# Patient Record
Sex: Female | Born: 1935 | Race: White | Hispanic: No | Smoking: Never smoker
Health system: Southern US, Community
[De-identification: ages and names within clinical notes are randomized; demographics above are authoritative.]

## PROBLEM LIST (undated history)

## (undated) DIAGNOSIS — R945 Abnormal results of liver function studies: Secondary | ICD-10-CM

## (undated) DIAGNOSIS — S129XXA Fracture of neck, unspecified, initial encounter: Secondary | ICD-10-CM

## (undated) DIAGNOSIS — I447 Left bundle-branch block, unspecified: Secondary | ICD-10-CM

## (undated) DIAGNOSIS — D509 Iron deficiency anemia, unspecified: Secondary | ICD-10-CM

## (undated) DIAGNOSIS — K8689 Other specified diseases of pancreas: Secondary | ICD-10-CM

## (undated) DIAGNOSIS — K648 Other hemorrhoids: Secondary | ICD-10-CM

## (undated) DIAGNOSIS — M1990 Unspecified osteoarthritis, unspecified site: Secondary | ICD-10-CM

## (undated) DIAGNOSIS — E538 Deficiency of other specified B group vitamins: Secondary | ICD-10-CM

## (undated) DIAGNOSIS — I428 Other cardiomyopathies: Secondary | ICD-10-CM

## (undated) DIAGNOSIS — K589 Irritable bowel syndrome without diarrhea: Secondary | ICD-10-CM

## (undated) DIAGNOSIS — K2960 Other gastritis without bleeding: Secondary | ICD-10-CM

## (undated) DIAGNOSIS — M797 Fibromyalgia: Secondary | ICD-10-CM

## (undated) DIAGNOSIS — Z7901 Long term (current) use of anticoagulants: Secondary | ICD-10-CM

## (undated) DIAGNOSIS — Z9289 Personal history of other medical treatment: Secondary | ICD-10-CM

## (undated) DIAGNOSIS — K5790 Diverticulosis of intestine, part unspecified, without perforation or abscess without bleeding: Secondary | ICD-10-CM

## (undated) DIAGNOSIS — I2699 Other pulmonary embolism without acute cor pulmonale: Secondary | ICD-10-CM

## (undated) DIAGNOSIS — R7989 Other specified abnormal findings of blood chemistry: Secondary | ICD-10-CM

## (undated) DIAGNOSIS — I495 Sick sinus syndrome: Secondary | ICD-10-CM

## (undated) DIAGNOSIS — I509 Heart failure, unspecified: Secondary | ICD-10-CM

## (undated) DIAGNOSIS — R931 Abnormal findings on diagnostic imaging of heart and coronary circulation: Secondary | ICD-10-CM

## (undated) DIAGNOSIS — D649 Anemia, unspecified: Secondary | ICD-10-CM

## (undated) DIAGNOSIS — K922 Gastrointestinal hemorrhage, unspecified: Secondary | ICD-10-CM

## (undated) DIAGNOSIS — I482 Chronic atrial fibrillation, unspecified: Secondary | ICD-10-CM

## (undated) HISTORY — DX: Chronic atrial fibrillation, unspecified: I48.20

## (undated) HISTORY — PX: BACK SURGERY: SHX140

## (undated) HISTORY — DX: Left bundle-branch block, unspecified: I44.7

## (undated) HISTORY — PX: BLADDER SURGERY: SHX569

## (undated) HISTORY — DX: Gastrointestinal hemorrhage, unspecified: K92.2

## (undated) HISTORY — PX: CARPAL TUNNEL RELEASE: SHX101

## (undated) HISTORY — PX: OVARIAN CYST REMOVAL: SHX89

## (undated) HISTORY — DX: Long term (current) use of anticoagulants: Z79.01

## (undated) HISTORY — DX: Personal history of other medical treatment: Z92.89

## (undated) HISTORY — DX: Abnormal results of liver function studies: R94.5

## (undated) HISTORY — DX: Fracture of neck, unspecified, initial encounter: S12.9XXA

## (undated) HISTORY — DX: Other specified abnormal findings of blood chemistry: R79.89

## (undated) HISTORY — DX: Abnormal findings on diagnostic imaging of heart and coronary circulation: R93.1

## (undated) HISTORY — DX: Other specified diseases of pancreas: K86.89

## (undated) HISTORY — DX: Other cardiomyopathies: I42.8

## (undated) HISTORY — DX: Iron deficiency anemia, unspecified: D50.9

## (undated) HISTORY — DX: Deficiency of other specified B group vitamins: E53.8

## (undated) HISTORY — DX: Diverticulosis of intestine, part unspecified, without perforation or abscess without bleeding: K57.90

## (undated) HISTORY — PX: WRIST SURGERY: SHX841

## (undated) HISTORY — PX: OTHER SURGICAL HISTORY: SHX169

## (undated) HISTORY — DX: Unspecified osteoarthritis, unspecified site: M19.90

## (undated) HISTORY — DX: Sick sinus syndrome: I49.5

## (undated) HISTORY — PX: HERNIA REPAIR: SHX51

## (undated) HISTORY — DX: Other pulmonary embolism without acute cor pulmonale: I26.99

## (undated) HISTORY — DX: Other gastritis without bleeding: K29.60

## (undated) HISTORY — DX: Other hemorrhoids: K64.8

## (undated) HISTORY — DX: Irritable bowel syndrome, unspecified: K58.9

## (undated) HISTORY — PX: TUBAL LIGATION: SHX77

## (undated) HISTORY — DX: Anemia, unspecified: D64.9

---

## 1992-12-18 HISTORY — PX: CARDIAC CATHETERIZATION: SHX172

## 2000-09-11 ENCOUNTER — Encounter: Admission: RE | Admit: 2000-09-11 | Discharge: 2000-10-09 | Payer: Self-pay | Admitting: Orthopedic Surgery

## 2000-10-15 ENCOUNTER — Encounter: Admission: RE | Admit: 2000-10-15 | Discharge: 2000-12-10 | Payer: Self-pay | Admitting: Orthopedic Surgery

## 2001-03-21 ENCOUNTER — Encounter: Payer: Self-pay | Admitting: Emergency Medicine

## 2001-03-21 ENCOUNTER — Inpatient Hospital Stay (HOSPITAL_COMMUNITY): Admission: EM | Admit: 2001-03-21 | Discharge: 2001-03-26 | Payer: Self-pay | Admitting: Emergency Medicine

## 2001-03-22 ENCOUNTER — Encounter: Payer: Self-pay | Admitting: Cardiovascular Disease

## 2001-03-25 ENCOUNTER — Encounter: Payer: Self-pay | Admitting: Cardiovascular Disease

## 2001-10-23 ENCOUNTER — Encounter: Payer: Self-pay | Admitting: Emergency Medicine

## 2001-10-24 ENCOUNTER — Inpatient Hospital Stay (HOSPITAL_COMMUNITY): Admission: AC | Admit: 2001-10-24 | Discharge: 2001-11-04 | Payer: Self-pay | Admitting: Emergency Medicine

## 2001-10-24 ENCOUNTER — Encounter: Payer: Self-pay | Admitting: Surgery

## 2001-10-24 ENCOUNTER — Encounter: Payer: Self-pay | Admitting: Emergency Medicine

## 2001-10-24 ENCOUNTER — Encounter: Payer: Self-pay | Admitting: Orthopedic Surgery

## 2001-10-25 ENCOUNTER — Encounter: Payer: Self-pay | Admitting: Emergency Medicine

## 2001-10-27 ENCOUNTER — Encounter: Payer: Self-pay | Admitting: Surgery

## 2001-10-28 ENCOUNTER — Encounter: Payer: Self-pay | Admitting: Surgery

## 2001-10-31 ENCOUNTER — Encounter: Payer: Self-pay | Admitting: General Surgery

## 2001-11-01 ENCOUNTER — Encounter: Payer: Self-pay | Admitting: General Surgery

## 2001-11-03 ENCOUNTER — Encounter: Payer: Self-pay | Admitting: General Surgery

## 2001-12-19 ENCOUNTER — Encounter: Payer: Self-pay | Admitting: Neurosurgery

## 2001-12-19 ENCOUNTER — Ambulatory Visit (HOSPITAL_COMMUNITY): Admission: RE | Admit: 2001-12-19 | Discharge: 2001-12-19 | Payer: Self-pay | Admitting: Neurosurgery

## 2002-01-20 ENCOUNTER — Ambulatory Visit (HOSPITAL_COMMUNITY): Admission: RE | Admit: 2002-01-20 | Discharge: 2002-01-20 | Payer: Self-pay | Admitting: Neurosurgery

## 2002-01-20 ENCOUNTER — Encounter: Payer: Self-pay | Admitting: Neurosurgery

## 2002-01-25 ENCOUNTER — Emergency Department (HOSPITAL_COMMUNITY): Admission: EM | Admit: 2002-01-25 | Discharge: 2002-01-25 | Payer: Self-pay | Admitting: Emergency Medicine

## 2002-09-19 ENCOUNTER — Inpatient Hospital Stay (HOSPITAL_COMMUNITY): Admission: EM | Admit: 2002-09-19 | Discharge: 2002-09-26 | Payer: Self-pay | Admitting: Emergency Medicine

## 2002-09-19 ENCOUNTER — Encounter: Payer: Self-pay | Admitting: Emergency Medicine

## 2002-09-20 ENCOUNTER — Encounter: Payer: Self-pay | Admitting: Specialist

## 2002-09-22 ENCOUNTER — Encounter: Payer: Self-pay | Admitting: Specialist

## 2002-09-23 ENCOUNTER — Encounter: Payer: Self-pay | Admitting: Specialist

## 2002-09-24 ENCOUNTER — Encounter: Payer: Self-pay | Admitting: Specialist

## 2002-09-25 ENCOUNTER — Encounter: Payer: Self-pay | Admitting: Specialist

## 2002-09-26 ENCOUNTER — Inpatient Hospital Stay
Admission: RE | Admit: 2002-09-26 | Discharge: 2002-10-08 | Payer: Self-pay | Admitting: Physical Medicine & Rehabilitation

## 2003-04-17 ENCOUNTER — Ambulatory Visit (HOSPITAL_COMMUNITY): Admission: RE | Admit: 2003-04-17 | Discharge: 2003-04-17 | Payer: Self-pay | Admitting: Family Medicine

## 2003-04-17 ENCOUNTER — Encounter: Payer: Self-pay | Admitting: Family Medicine

## 2004-04-22 ENCOUNTER — Ambulatory Visit (HOSPITAL_COMMUNITY): Admission: RE | Admit: 2004-04-22 | Discharge: 2004-04-22 | Payer: Self-pay | Admitting: Family Medicine

## 2004-06-10 ENCOUNTER — Ambulatory Visit (HOSPITAL_COMMUNITY): Admission: RE | Admit: 2004-06-10 | Discharge: 2004-06-10 | Payer: Self-pay | Admitting: General Surgery

## 2005-06-23 ENCOUNTER — Ambulatory Visit (HOSPITAL_COMMUNITY): Admission: RE | Admit: 2005-06-23 | Discharge: 2005-06-23 | Payer: Self-pay | Admitting: Family Medicine

## 2006-05-30 ENCOUNTER — Encounter: Admission: RE | Admit: 2006-05-30 | Discharge: 2006-05-30 | Payer: Self-pay | Admitting: Neurosurgery

## 2007-04-18 ENCOUNTER — Ambulatory Visit (HOSPITAL_COMMUNITY): Admission: RE | Admit: 2007-04-18 | Discharge: 2007-04-18 | Payer: Self-pay | Admitting: Family Medicine

## 2007-04-26 ENCOUNTER — Ambulatory Visit (HOSPITAL_COMMUNITY): Admission: RE | Admit: 2007-04-26 | Discharge: 2007-04-26 | Payer: Self-pay | Admitting: Family Medicine

## 2007-06-04 ENCOUNTER — Ambulatory Visit: Payer: Self-pay | Admitting: Internal Medicine

## 2007-06-06 ENCOUNTER — Ambulatory Visit: Payer: Self-pay | Admitting: Internal Medicine

## 2007-07-18 ENCOUNTER — Ambulatory Visit: Payer: Self-pay | Admitting: Internal Medicine

## 2007-07-24 ENCOUNTER — Ambulatory Visit (HOSPITAL_COMMUNITY): Admission: RE | Admit: 2007-07-24 | Discharge: 2007-07-24 | Payer: Self-pay | Admitting: Internal Medicine

## 2007-08-15 ENCOUNTER — Encounter: Admission: RE | Admit: 2007-08-15 | Discharge: 2007-08-15 | Payer: Self-pay | Admitting: Cardiovascular Disease

## 2007-08-21 ENCOUNTER — Ambulatory Visit (HOSPITAL_COMMUNITY): Admission: RE | Admit: 2007-08-21 | Discharge: 2007-08-21 | Payer: Self-pay | Admitting: Cardiovascular Disease

## 2007-08-28 ENCOUNTER — Ambulatory Visit: Payer: Self-pay | Admitting: Internal Medicine

## 2007-09-18 ENCOUNTER — Ambulatory Visit: Payer: Self-pay | Admitting: Internal Medicine

## 2007-09-18 LAB — CONVERTED CEMR LAB
OCCULT 1: NEGATIVE
OCCULT 4: NEGATIVE

## 2007-10-07 ENCOUNTER — Ambulatory Visit: Payer: Self-pay | Admitting: Internal Medicine

## 2008-02-24 DIAGNOSIS — D68318 Other hemorrhagic disorder due to intrinsic circulating anticoagulants, antibodies, or inhibitors: Secondary | ICD-10-CM | POA: Insufficient documentation

## 2008-02-24 DIAGNOSIS — I4891 Unspecified atrial fibrillation: Secondary | ICD-10-CM | POA: Insufficient documentation

## 2008-02-24 DIAGNOSIS — M1990 Unspecified osteoarthritis, unspecified site: Secondary | ICD-10-CM

## 2008-02-24 DIAGNOSIS — R945 Abnormal results of liver function studies: Secondary | ICD-10-CM | POA: Insufficient documentation

## 2008-02-24 DIAGNOSIS — K589 Irritable bowel syndrome without diarrhea: Secondary | ICD-10-CM | POA: Insufficient documentation

## 2009-04-19 ENCOUNTER — Ambulatory Visit (HOSPITAL_COMMUNITY): Admission: RE | Admit: 2009-04-19 | Discharge: 2009-04-19 | Payer: Self-pay | Admitting: Family Medicine

## 2009-05-12 ENCOUNTER — Encounter: Admission: RE | Admit: 2009-05-12 | Discharge: 2009-05-12 | Payer: Self-pay | Admitting: Family Medicine

## 2009-06-17 DIAGNOSIS — K2960 Other gastritis without bleeding: Secondary | ICD-10-CM

## 2009-06-17 HISTORY — DX: Other gastritis without bleeding: K29.60

## 2009-06-17 HISTORY — PX: ESOPHAGOGASTRODUODENOSCOPY: SHX1529

## 2009-07-02 ENCOUNTER — Telehealth (INDEPENDENT_AMBULATORY_CARE_PROVIDER_SITE_OTHER): Payer: Self-pay

## 2009-07-02 ENCOUNTER — Ambulatory Visit (HOSPITAL_COMMUNITY): Admission: RE | Admit: 2009-07-02 | Discharge: 2009-07-02 | Payer: Self-pay | Admitting: Family Medicine

## 2009-07-05 ENCOUNTER — Ambulatory Visit: Payer: Self-pay | Admitting: Internal Medicine

## 2009-07-05 LAB — CONVERTED CEMR LAB
BUN: 20 mg/dL (ref 6–23)
Basophils Relative: 0.4 % (ref 0.0–3.0)
Creatinine, Ser: 0.9 mg/dL (ref 0.4–1.2)
Eosinophils Absolute: 0.1 10*3/uL (ref 0.0–0.7)
GFR calc non Af Amer: 63.22 mL/min (ref >60)
MCHC: 34 g/dL (ref 30.0–36.0)
MCV: 94.8 fL (ref 78.0–100.0)
Monocytes Absolute: 0.4 10*3/uL (ref 0.1–1.0)
Neutro Abs: 2.2 10*3/uL (ref 1.4–7.7)
Neutrophils Relative %: 54.5 % (ref 43.0–77.0)
Prothrombin Time: 11.2 s (ref 10.9–13.3)
RBC: 4.19 M/uL (ref 3.87–5.11)

## 2009-07-06 ENCOUNTER — Encounter: Payer: Self-pay | Admitting: Internal Medicine

## 2009-07-06 ENCOUNTER — Ambulatory Visit: Payer: Self-pay | Admitting: Internal Medicine

## 2009-07-08 ENCOUNTER — Telehealth: Payer: Self-pay | Admitting: Internal Medicine

## 2009-08-18 ENCOUNTER — Ambulatory Visit: Payer: Self-pay | Admitting: Internal Medicine

## 2009-08-18 HISTORY — PX: COLONOSCOPY: SHX174

## 2009-08-19 ENCOUNTER — Telehealth: Payer: Self-pay | Admitting: Internal Medicine

## 2009-08-19 ENCOUNTER — Ambulatory Visit: Payer: Self-pay | Admitting: Internal Medicine

## 2009-08-19 LAB — CONVERTED CEMR LAB
Basophils Absolute: 0 10*3/uL (ref 0.0–0.1)
Eosinophils Absolute: 0 10*3/uL (ref 0.0–0.7)
Lymphocytes Relative: 27.1 % (ref 12.0–46.0)
MCHC: 31.9 g/dL (ref 30.0–36.0)
Neutrophils Relative %: 61 % (ref 43.0–77.0)
RDW: 14.3 % (ref 11.5–14.6)

## 2009-08-20 ENCOUNTER — Ambulatory Visit: Payer: Self-pay | Admitting: Internal Medicine

## 2009-08-20 DIAGNOSIS — E538 Deficiency of other specified B group vitamins: Secondary | ICD-10-CM | POA: Insufficient documentation

## 2009-08-20 DIAGNOSIS — D509 Iron deficiency anemia, unspecified: Secondary | ICD-10-CM

## 2009-08-20 LAB — CONVERTED CEMR LAB
Saturation Ratios: 7.6 % — ABNORMAL LOW (ref 20.0–50.0)
Transferrin: 328 mg/dL (ref 212.0–360.0)

## 2009-08-24 ENCOUNTER — Telehealth: Payer: Self-pay | Admitting: Internal Medicine

## 2009-08-26 ENCOUNTER — Ambulatory Visit: Payer: Self-pay | Admitting: Internal Medicine

## 2009-08-26 ENCOUNTER — Telehealth (INDEPENDENT_AMBULATORY_CARE_PROVIDER_SITE_OTHER): Payer: Self-pay

## 2009-08-27 ENCOUNTER — Ambulatory Visit: Payer: Self-pay | Admitting: Internal Medicine

## 2009-09-06 ENCOUNTER — Ambulatory Visit: Payer: Self-pay | Admitting: Internal Medicine

## 2009-09-09 ENCOUNTER — Encounter: Payer: Self-pay | Admitting: Internal Medicine

## 2009-09-10 ENCOUNTER — Ambulatory Visit: Payer: Self-pay | Admitting: Internal Medicine

## 2009-09-13 ENCOUNTER — Telehealth: Payer: Self-pay | Admitting: Internal Medicine

## 2009-10-05 ENCOUNTER — Ambulatory Visit: Payer: Self-pay | Admitting: Internal Medicine

## 2009-10-05 LAB — CONVERTED CEMR LAB
Eosinophils Relative: 1.6 % (ref 0.0–5.0)
HCT: 41.2 % (ref 36.0–46.0)
Lymphs Abs: 1.2 10*3/uL (ref 0.7–4.0)
MCV: 87.3 fL (ref 78.0–100.0)
Monocytes Absolute: 0.3 10*3/uL (ref 0.1–1.0)
Neutro Abs: 3 10*3/uL (ref 1.4–7.7)
Platelets: 208 10*3/uL (ref 150.0–400.0)
RDW: 19.8 % — ABNORMAL HIGH (ref 11.5–14.6)
Vitamin B-12: 310 pg/mL (ref 211–911)
WBC: 4.6 10*3/uL (ref 4.5–10.5)

## 2009-10-07 ENCOUNTER — Ambulatory Visit: Payer: Self-pay | Admitting: Internal Medicine

## 2009-10-07 DIAGNOSIS — K8689 Other specified diseases of pancreas: Secondary | ICD-10-CM

## 2009-10-13 ENCOUNTER — Encounter: Payer: Self-pay | Admitting: Internal Medicine

## 2009-12-20 ENCOUNTER — Telehealth: Payer: Self-pay | Admitting: Internal Medicine

## 2010-01-06 ENCOUNTER — Ambulatory Visit: Payer: Self-pay | Admitting: Internal Medicine

## 2010-01-11 LAB — CONVERTED CEMR LAB
Basophils Relative: 0.1 % (ref 0.0–3.0)
Eosinophils Absolute: 0.1 10*3/uL (ref 0.0–0.7)
HCT: 45.5 % (ref 36.0–46.0)
Lymphs Abs: 1.7 10*3/uL (ref 0.7–4.0)
MCHC: 32.8 g/dL (ref 30.0–36.0)
MCV: 95.9 fL (ref 78.0–100.0)
Monocytes Absolute: 0.4 10*3/uL (ref 0.1–1.0)
Neutro Abs: 2.6 10*3/uL (ref 1.4–7.7)
Neutrophils Relative %: 52.7 % (ref 43.0–77.0)
RBC: 4.75 M/uL (ref 3.87–5.11)

## 2010-01-13 ENCOUNTER — Ambulatory Visit: Payer: Self-pay | Admitting: Internal Medicine

## 2010-01-20 ENCOUNTER — Ambulatory Visit: Payer: Self-pay | Admitting: Internal Medicine

## 2010-01-27 ENCOUNTER — Ambulatory Visit: Payer: Self-pay | Admitting: Internal Medicine

## 2010-02-24 ENCOUNTER — Ambulatory Visit: Payer: Self-pay | Admitting: Internal Medicine

## 2010-03-07 ENCOUNTER — Ambulatory Visit: Payer: Self-pay | Admitting: Internal Medicine

## 2010-03-28 ENCOUNTER — Ambulatory Visit: Payer: Self-pay | Admitting: Internal Medicine

## 2010-04-25 ENCOUNTER — Ambulatory Visit: Payer: Self-pay | Admitting: Internal Medicine

## 2010-05-27 ENCOUNTER — Ambulatory Visit: Payer: Self-pay | Admitting: Internal Medicine

## 2010-06-27 ENCOUNTER — Ambulatory Visit: Payer: Self-pay | Admitting: Internal Medicine

## 2010-08-29 ENCOUNTER — Encounter
Admission: RE | Admit: 2010-08-29 | Discharge: 2010-09-29 | Payer: Self-pay | Source: Home / Self Care | Admitting: Orthopaedic Surgery

## 2010-09-20 ENCOUNTER — Telehealth: Payer: Self-pay | Admitting: Internal Medicine

## 2010-09-26 ENCOUNTER — Ambulatory Visit: Payer: Self-pay | Admitting: Internal Medicine

## 2010-11-04 ENCOUNTER — Ambulatory Visit: Payer: Self-pay | Admitting: Internal Medicine

## 2010-11-04 LAB — CONVERTED CEMR LAB
ALT: 10 units/L (ref 0–35)
Alkaline Phosphatase: 56 units/L (ref 39–117)
Basophils Absolute: 0 10*3/uL (ref 0.0–0.1)
Creatinine, Ser: 0.9 mg/dL (ref 0.4–1.2)
Eosinophils Absolute: 0.1 10*3/uL (ref 0.0–0.7)
Ferritin: 135.2 ng/mL (ref 10.0–291.0)
GFR calc non Af Amer: 60.68 mL/min (ref >60)
Lymphocytes Relative: 26.2 % (ref 12.0–46.0)
MCHC: 34 g/dL (ref 30.0–36.0)
MCV: 95.2 fL (ref 78.0–100.0)
Monocytes Absolute: 0.4 10*3/uL (ref 0.1–1.0)
Neutrophils Relative %: 62.8 % (ref 43.0–77.0)
Platelets: 208 10*3/uL (ref 150.0–400.0)
RDW: 14.1 % (ref 11.5–14.6)
Sodium: 142 meq/L (ref 135–145)
Total Bilirubin: 1 mg/dL (ref 0.3–1.2)
Total Protein: 6.4 g/dL (ref 6.0–8.3)
Vit D, 25-Hydroxy: 37 ng/mL (ref 30–89)

## 2010-11-06 ENCOUNTER — Telehealth: Payer: Self-pay | Admitting: Internal Medicine

## 2010-12-18 DIAGNOSIS — I495 Sick sinus syndrome: Secondary | ICD-10-CM

## 2010-12-18 HISTORY — DX: Sick sinus syndrome: I49.5

## 2010-12-27 ENCOUNTER — Ambulatory Visit
Admission: RE | Admit: 2010-12-27 | Discharge: 2010-12-27 | Payer: Self-pay | Source: Home / Self Care | Attending: Internal Medicine | Admitting: Internal Medicine

## 2011-01-17 NOTE — Progress Notes (Signed)
Summary: Labs before appt?  Phone Note Call from Patient Call back at Home Phone [phone]   Call For: DR [facility] Summary of Call: Does she need labs before her 62m fu appoinment sched 01-06-10? Initial call taken by: Leanor Kail [facility],  December 20, 2009 4:19 PM  Follow-up for Phone Call        Patient  is due for labs the end of January and a b12 injection.  I have rescheduled the REV to a B12 injection.  I advised her that once we have her lab work we will determine if a REV is needed Follow-up by: Darcey Nora RN, CGRN,  December 21, 2009 8:56 AM

## 2011-01-17 NOTE — Assessment & Plan Note (Signed)
Summary: f/u--ch.   History of Present Illness Visit Type: Follow-up Visit Primary GI MD: Stan Head MD [facility] Primary Provider: Butch Penny, MD Requesting Provider: n/a Chief Complaint: diarrhea History of Present Illness:   77 yo ww with pancreatic insufficiency. Here for follow-up, last seen March 2011.  Still has intermitent diarrhea, variable. So far this month only 3 times with diarrhea. Still starts with belchin, cramps and then diarrhea - stools start off normal and becme losose and it ends after 2-3 hours. This is the same pattern over time.  when she was changed to Creon only got three times a day and needs it for snacks  preparing for a bus trip to [place]   GI Review of Systems      Denies abdominal pain, acid reflux, belching, bloating, chest pain, dysphagia with liquids, dysphagia with solids, heartburn, loss of appetite, nausea, vomiting, vomiting blood, weight loss, and  weight gain.      Reports diarrhea.     Denies anal fissure, black tarry stools, change in bowel habit, constipation, diverticulosis, fecal incontinence, heme positive stool, hemorrhoids, irritable bowel syndrome, jaundice, light color stool, liver problems, rectal bleeding, and  rectal pain.    Clinical Reports Reviewed:  EGD:  07/06/2009:  1) Ulcer in the antrum REACTIVE GASTROPATHY, NO H PYLORI 2) Severe erosive gastritis in the antrum 3) 2 cm hiatal hernia 4) Otherwise normal examination  Colonoscopy  Procedure date:  08/26/2009  Findings:      1) Mild diverticulosis in the sigmoid colon 2) Otherwise normal examination of colon 3) Internal hemorrhoids in the rectum    Current Medications (verified): 1)  Diovan Hct 80-12.5 Mg Tabs (Valsartan-Hydrochlorothiazide) .... One Tablet By Mouth Once Daily 2)  Lanoxin 0.125 Mg Tabs (Digoxin) .... One Tablet By Mouth Once Daily 3)  Bystolic 5 Mg Tabs (Nebivolol Hcl) .... 1/2 Tablet By Mouth Once Daily 4)  Calcium 1200-1000 Mg-Unit Chew  (Calcium Carbonate-Vit D-Min) .... One Tablet By Mouth Once Daily 5)  Macuvite  Tabs (Multiple Vitamins-Minerals) .... One Tablet By Mouth Once Daily 6)  Vitamin D3 400 Unit Tabs (Cholecalciferol) .... One Tablet By Mouth Once Daily 7)  Lansoprazole 30 Mg Cpdr (Lansoprazole) .Marland Kitchen.. 1 By Mouth 30 Mins Before Breakfast 8)  Warfarin Sodium 2 Mg Tabs (Warfarin Sodium) .... As Directed 9)  Celebrex 200 Mg Caps (Celecoxib) .... One Tablet By Mouth Three Times A Week 10)  Creon 24000 Unit Cpep (Pancrelipase (Lip-Prot-Amyl)) .Marland Kitchen.. 1 By Mouth Three Times A Day Ac Meals 11)  Iron 325 (65 Fe) Mg Tabs (Ferrous Sulfate) .Marland Kitchen.. 1 By Mouth Once A Day  Allergies (verified): 1)  Demerol (Meperidine Hcl) 2)  Morphine Sulfate (Morphine Sulfate)  Past History:  Past Medical History: LIVER FUNCTION TESTS, ABNORMAL DUE TO AMIODARONE OSTEOARTHRITIS, HAND (ICD-715.94) ATRIAL FIBRILLATION, CHRONIC ON COUMADIN (ICD-427.31).....Dr. Daphene Matthews IRRITABLE BOWEL SYNDROME (ICD-564.1) PANCREATIC INSUFFICIENCY (? MASS IN PANCREAS WITH NEGATIVE F/U MR 2008) GASTRIC ULCER/GASTRITIS 7/10 B12 DEFICIENCY IRON DEFICIENCY C-Spine fracture GASTRIC ULCER WITH EROSIVE GASTRITIS (ICD-531.90) LIVER FUNCTION TESTS, ABNORMAL (ICD-794.8)  Past Surgical History: Reviewed history from 10/07/2009 and no changes required. Tubal ligation Back surgery Left knee surgery  Hernia surgery Carpul tunnel surgery right right plate implate wrist bladder tact ovarian cyst removed  Family History: Reviewed history from 10/07/2009 and no changes required. No FH of Colon Cancer Family History of Heart Disease: mother  Social History: Reviewed history from 07/05/2009 and no changes required. 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  Alcohol Use - yes: Glass of wine occ Illicit Drug Use - no Patient does not get regular exercise.  Daily Caffeine Use: one glass of tea  Vital  Signs:  Patient profile:   77 year old female Height:      65 inches Weight:      184 pounds BMI:     30.73 BSA:     1.91 Pulse rate:   64 / minute Pulse rhythm:   irregular BP sitting:   110 / 60  (left arm) Cuff size:   regular  Vitals Entered By: Ok Anis CMA (November 04, 2010 11:40 AM)  Physical Exam  General:  Well developed, well nourished, no acute distress.   Impression & Recommendations:  Problem # 1:  PANCREATIC INSUFFICIENCY (ICD-577.8) Assessment Unchanged Will change her rx to 5 Creon/day to allow for snack supplementation. She has responded to pancreatic enzymes. I am uncertain as to the cause of relatively infreqent spells of diarrhe. It could be dietary indiscretion (fat).   Orders: T-Vitamin D (25-Hydroxy) [phone]) TLB-CMP (Comprehensive Metabolic Pnl) (80053-COMP)  Problem # 2:  ANEMIA-IRON DEFICIENCY (ICD-280.9) Assessment: Unchanged She had been improving on iron. Gastric ulcer and bleed in 2010. Colonoscopy negative for cause 2010 will reassess labs and need for supplementation Orders: TLB-CBC Platelet - w/Differential (85025-CBCD) TLB-Ferritin (82728-FER)  Problem # 3:  VITAMIN B12 DEFICIENCY (ICD-266.2) Assessment: Unchanged reassess level likely ok for q 3 month supplents Orders: TLB-B12, Serum-Total ONLY (09811-B14)  Problem # 4:  IRRITABLE BOWEL SYNDROME (ICD-564.1) Assessment: Improved  Patient Instructions: 1)  Copy sent to : Christine Penny, MD and Christine Jaeger, MD 2)  Please go to the basement to have your lab tests drawn today.  3)  We will call you with further follow up after reviewing these results.  4)  Please pick up your medications at your pharmacy. CREON 5)  The medication list was reviewed and reconciled.  All changed / newly prescribed medications were explained.  A complete medication list was provided to the patient / caregiver. Prescriptions: CREON 24000 UNIT CPEP (PANCRELIPASE (LIP-PROT-AMYL)) 1 by mouth three  times a day ac meals and with snacks  #150 x 11   Entered and Authorized by:   Iva Boop MD, [facility]   Signed by:   Iva Boop MD, [facility] on 11/04/2010   Method used:   Electronically to        [facility] [address] [phone]* (retail)       [address]       [place], [place]  [address]       Ph: [phone] or [phone]       Fax: [phone]   RxID:   [id number]  Patient: Rexford Maus Note: All result statuses are Final unless otherwise noted.  Tests: (1) CBC Platelet w/Diff (CBCD)   White Cell Count          4.6 K/uL                    4.5-10.5   Red Cell Count            4.75 Mil/uL                 3.87-5.11   Hemoglobin           [H]  15.4 g/dL  12.0-15.0   Hematocrit                45.2 %                      36.0-46.0   MCV                       95.2 fl                     78.0-100.0   MCHC                      34.0 g/dL                   45.4-09.8   RDW                       14.1 %                      11.5-14.6   Platelet Count            208.0 K/uL                  150.0-400.0   Neutrophil %              62.8 %                      43.0-77.0   Lymphocyte %              26.2 %                      12.0-46.0   Monocyte %                8.8 %                       3.0-12.0   Eosinophils%              1.6 %                       0.0-5.0   Basophils %               0.6 %                       0.0-3.0   Neutrophill Absolute      2.9 K/uL                    1.4-7.7   Lymphocyte Absolute       1.2 K/uL                    0.7-4.0   Monocyte Absolute         0.4 K/uL                    0.1-1.0  Eosinophils, Absolute                             0.1 K/uL                    0.0-0.7   Basophils Absolute        0.0 K/uL  0.0-0.1  Tests: (2) B12 Serum - Total ONLY (B12)   Vitamin B12               349 pg/mL                   211-911  Tests: (3) Ferritin (FER)   Ferritin                  135.2 ng/mL                  10.0-291.0  Tests: (4) CMP (COMP)   Sodium                    142 mEq/L                   135-145   Potassium                 4.0 mEq/L                   3.5-5.1   Chloride                  103 mEq/L                   96-112   Carbon Dioxide            28 mEq/L                    19-32   Glucose              [H]  105 mg/dL                   16-10   BUN                  [H]  27 mg/dL                    9-60   Creatinine                0.9 mg/dL                   4.5-4.0   Total Bilirubin           1.0 mg/dL                   9.8-1.1   Alkaline Phosphatase      56 U/L                      39-117   AST                       14 U/L                      0-37   ALT                       10 U/L                      0-35   Total Protein             6.4 g/dL                    9.1-4.7   Albumin  4.2 g/dL                    0.9-8.1   Calcium                   9.8 mg/dL                   1.9-14.7   GFR                       60.68 mL/min                >60  Note: An exclamation mark (!) indicates a result that was not dispersed into the flowsheet. Document Creation Date: 11/04/2010 2:49 PM Patient: [name] Note: All result statuses are Final unless otherwise noted.  Tests: (1) Vitamin D (25-Hydroxy) (82956)  Vitamin D (25-Hydroxy)                             37 ng/mL                    30-89     This assay accurately quantifies Vitamin D, which is the sum of the     25-Hydroxy forms of Vitamin D2 and D3.  Studies have shown that the     optimum concentration of 25-Hydroxy Vitamin D is 30 ng/mL or higher.      Concentrations of Vitamin D between 20 and 29 ng/mL are considered to     be insufficient and concentrations less than 20 ng/mL are considered     to be deficient for Vitamin D.  Note: An exclamation mark (!) indicates a result that was not dispersed into the flowsheet. Document Creation Date: 11/05/2010 8:25 AM   WE WILL CALL HER AND TELL HER 1) STOP IRON AS  HGB AND IRON OK 2) B12 INJECTION EVERY 3 MONTHS 3) VIT D OK BUT WOULD STAY ON THE SUPPLEMENT - CAN ASK DR. Megan Mans ALSO 4) FOLLOW-UP ME 6 MONTHS REOUTINE

## 2011-01-17 NOTE — Assessment & Plan Note (Signed)
Summary: MONTHLY B12 SHOT...LSW.  Nurse Visit   Allergies: 1)  Demerol (Meperidine Hcl) 2)  Morphine Sulfate (Morphine Sulfate)  Medication Administration  Injection # 1:    Medication: Vit B12 1000 mcg    Diagnosis: VITAMIN B12 DEFICIENCY (ICD-266.2)    Route: IM    Site: L deltoid    Exp Date: 01/2012    Lot #: [id number]    Mfr: [employer]    Patient tolerated injection without complications    Given by: Christie Nottingham CMA Duncan Dull) (May 27, 2010 3:51 PM)  Orders Added: 1)  Vit B12 1000 mcg [J3420]

## 2011-01-17 NOTE — Assessment & Plan Note (Signed)
Summary: MONTHLY B12 SHOT...LSW.  Nurse Visit   Allergies: 1)  Demerol (Meperidine Hcl) 2)  Morphine Sulfate (Morphine Sulfate)  Medication Administration  Injection # 1:    Medication: Vit B12 1000 mcg    Diagnosis: VITAMIN B12 DEFICIENCY (ICD-266.2)    Route: IM    Site: L deltoid    Exp Date: 11/2011    Lot #: [id number]    Mfr: [employer]    Comments: pt complained of pain at the injection site when B12 administered.  Pt advised to use the arm to work out any soreness and she could take tylenol.  pt to schedule next monthly B 12 at front desk.    Patient tolerated injection without complications    Given by: Chales Abrahams CMA Duncan Dull) (Apr 25, 2010 3:02 PM)  Orders Added: 1)  Vit B12 1000 mcg [J3420]

## 2011-01-17 NOTE — Assessment & Plan Note (Signed)
Summary: weekly B12 #2...as.  Nurse Visit   Allergies: 1)  Demerol (Meperidine Hcl) 2)  Morphine Sulfate (Morphine Sulfate)  Medication Administration  Injection # 1:    Medication: Vit B12 1000 mcg    Diagnosis: VITAMIN B12 DEFICIENCY (ICD-266.2)    Route: IM    Site: L deltoid    Exp Date: 112012    Lot #: [id number]    Mfr: [employer]    Comments: Pt sent to front to schedule #3 weekly injection..Then to follow with monthly injections     Patient tolerated injection without complications    Given by: Ok Anis CMA (January 20, 2010 3:16 PM)  Orders Added: 1)  Vit B12 1000 mcg [J3420]

## 2011-01-17 NOTE — Assessment & Plan Note (Signed)
Summary: anemia follow up/sheri   History of Present Illness Visit Type: Follow-up Visit Primary GI MD: Stan Head MD [facility] Primary Provider: Butch Penny, MD Requesting Provider: n/a Chief Complaint: Fatigue History of Present Illness:   She has gained weight. getting B12 injections She has quite a craving for sweets and cannot resist. Recent joint pains. ? rip next month but doesn't think her legs would tolerate it and her neck is bothering her also. Overall still some spells of diarrhea, but is dealing with it. It is still sporadic and unpredictable but is usually at hoe and in AM. Multiple bowel mvements and then ok.Now on [employer] without bvious side effects. she has to get through her ophthalmologist.   GI Review of Systems      Denies abdominal pain, acid reflux, belching, bloating, chest pain, dysphagia with liquids, dysphagia with solids, heartburn, loss of appetite, nausea, vomiting, vomiting blood, weight loss, and  weight gain.        Denies anal fissure, black tarry stools, change in bowel habit, constipation, diarrhea, diverticulosis, fecal incontinence, heme positive stool, hemorrhoids, irritable bowel syndrome, jaundice, light color stool, liver problems, rectal bleeding, and  rectal pain.    Current Medications (verified): 1)  Diovan Hct 80-12.5 Mg Tabs (Valsartan-Hydrochlorothiazide) .... One Tablet By Mouth Once Daily 2)  Lanoxin 0.125 Mg Tabs (Digoxin) .... One Tablet By Mouth Once Daily 3)  Bystolic 5 Mg Tabs (Nebivolol Hcl) .... 1/2 Tablet By Mouth Once Daily 4)  Calcium 1200-1000 Mg-Unit Chew (Calcium Carbonate-Vit D-Min) .... One Tablet By Mouth Once Daily 5)  Macuvite  Tabs (Multiple Vitamins-Minerals) .... One Tablet By Mouth Once Daily 6)  Vitamin D3 400 Unit Tabs (Cholecalciferol) .... One Tablet By Mouth Once Daily 7)  Lansoprazole 30 Mg Cpdr (Lansoprazole) .Marland Kitchen.. 1 By Mouth 30 Mins Before Breakfast 8)  Warfarin Sodium 2 Mg Tabs (Warfarin Sodium) ....  As Directed 9)  Celebrex 200 Mg Caps (Celecoxib) .... One Tablet By Mouth Three Times A Week 10)  Creon 24000 Unit Cpep (Pancrelipase (Lip-Prot-Amyl)) .Marland Kitchen.. 1 By Mouth Three Times A Day Ac Meals 11)  Iron 325 (65 Fe) Mg Tabs (Ferrous Sulfate) .Marland Kitchen.. 1 By Mouth Once A Day  Allergies (verified): 1)  Demerol (Meperidine Hcl) 2)  Morphine Sulfate (Morphine Sulfate)  Past History:  Past Medical History: LIVER FUNCTION TESTS, ABNORMAL DUE TO AMIODARONE OSTEOARTHRITIS, HAND (ICD-715.94) ATRIAL FIBRILLATION, CHRONIC ON COUMADIN (ICD-427.31).....Dr. Daphene Jaeger IRRITABLE BOWEL SYNDROME (ICD-564.1) PANCREATIC INSUFFICIENCY (? MASS IN PANCREAS WITH NEGATIVE F/U MR 2008) GASTRIC ULCER/GASTRITIS 7/10 B12 DEFICIENCY IRON DEFICIENCY C-Spine fracture  Past Surgical History: Reviewed history from 10/07/2009 and no changes required. Tubal ligation Back surgery Left knee surgery  Hernia surgery Carpul tunnel surgery right right plate implate wrist bladder tact ovarian cyst removed  Family History: Reviewed history from 10/07/2009 and no changes required. No FH of Colon Cancer Family History of Heart Disease: mother  Social History: Reviewed history from 07/05/2009 and no changes required. 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  Alcohol Use - yes: Glass of wine occ Illicit Drug Use - no Patient does not get regular exercise.  Daily Caffeine Use: one glass of tea  Vital Signs:  Patient profile:   77 year old female Height:      65 inches Weight:      188 pounds BSA:     1.93 Pulse rate:   64 / minute Pulse rhythm:   irregular BP sitting:  98 / 60  (left arm) Cuff size:   regular  Vitals Entered By: Ok Anis CMA (March 07, 2010 3:57 PM)  Physical Exam  General:  Well developed, well nourished, no acute distress.   Impression & Recommendations:  Problem # 1:  VITAMIN B12 DEFICIENCY (ICD-266.2) Assessment  Improved continue monthly supplements x 2 then every 3 months  Problem # 2:  ANEMIA-IRON DEFICIENCY (ICD-280.9) Assessment: Unchanged continue ferrous sulfate  Problem # 3:  PANCREATIC INSUFFICIENCY (ICD-577.8) Assessment: Unchanged stay on pancreatic enzymes.  Patient Instructions: 1)  Please continue current medications.  2)  Continue monthly B12 injections x 2 and then we will go to every 3 months. 3)  We will let you know abou this. 4)  Copy sent to : Foster Simpson, MD 5)  Please schedule a follow-up appointment in 6 months. 6)  The medication list was reviewed and reconciled.  All changed / newly prescribed medications were explained.  A complete medication list was provided to the patient / caregiver.

## 2011-01-17 NOTE — Assessment & Plan Note (Signed)
Summary: weekly B12 #3...as.  Nurse Visit   Allergies: 1)  Demerol (Meperidine Hcl) 2)  Morphine Sulfate (Morphine Sulfate)  Medication Administration  Injection # 1:    Medication: Vit B12 1000 mcg    Diagnosis: VITAMIN B12 DEFICIENCY (ICD-266.2)    Route: IM    Site: L deltoid    Exp Date: 10/19/2011    Lot #: [id number]    Mfr: [employer]    Patient tolerated injection without complications    Given by: Lowry Ram NCMA (January 27, 2010 3:59 PM) Scheduled the pt for next monthly B12 injection on 02-24-10 at 3:00 PM.

## 2011-01-17 NOTE — Assessment & Plan Note (Signed)
Summary: b12 injection--ch.  FLU ALSO//SP  Nurse Visit   Allergies: 1)  Demerol (Meperidine Hcl) 2)  Morphine Sulfate (Morphine Sulfate)  Medication Administration  Injection # 1:    Medication: Vit B12 1000 mcg    Diagnosis: VITAMIN B12 DEFICIENCY (ICD-266.2)    Route: IM    Site: R deltoid    Exp Date: 06/17/2012    Lot #: [id number]    Mfr: [employer]    Comments: Made pt next appointment  for 3 mo B12 injection  on 12-27-09. Appointment card given.    Patient tolerated injection without complications    Given by: Lowry Ram NCMA (September 26, 2010 3:05 PM)  Orders Added: 1)  Vit B12 1000 mcg [J3420]  Appended Document: b12 injection--ch.  FLU ALSO//SP   Immunizations Administered:  Influenza Vaccine # 1:    Vaccine Type: Fluvirin    Site: left deltoid    Mfr: [employer]    Dose: 0.5 ml    Route: IM    Given by: Lowry Ram NCMA    Exp. Date: 05/19/2011    Lot #: [id number]    VIS given: 07/12/10 version given September 26, 2010.  Flu Vaccine Consent Questions:    Do you have a history of severe allergic reactions to this vaccine? no    Any prior history of allergic reactions to egg and/or gelatin? no    Do you have a sensitivity to the preservative Thimersol? no    Do you have a past history of Guillan-Barre Syndrome? no    Do you currently have an acute febrile illness? no    Have you ever had a severe reaction to latex? no    Vaccine information given and explained to patient? yes    Are you currently pregnant? no

## 2011-01-17 NOTE — Assessment & Plan Note (Signed)
Summary: MONTHLY B12 SHOT...LSW.  Nurse Visit   Allergies: 1)  Demerol (Meperidine Hcl) 2)  Morphine Sulfate (Morphine Sulfate)  Medication Administration  Injection # 1:    Medication: Vit B12 1000 mcg    Diagnosis: VITAMIN B12 DEFICIENCY (ICD-266.2)    Route: IM    Site: L deltoid    Exp Date: 2/13    Lot #: [id number]    Mfr: [employer]    Patient tolerated injection without complications    Given by: Milford Cage NCMA (March 28, 2010 2:53 PM)  Orders Added: 1)  Vit B12 1000 mcg [J3420] Pt. did c/o arm hurting really bad after last injection.  She stated it also hurt going in today. I advised her to take some Tylenol prior to coming in for next B12 injection and to make sure she moves her arm frequently to prevent muscle soreness.    Milford Cage [facility]  March 28, 2010 2:54 PM

## 2011-01-17 NOTE — Assessment & Plan Note (Signed)
Summary: B12 SHOT  Nurse Visit   Medication Administration  Injection # 1:    Medication: Vit B12 1000 mcg    Diagnosis: VITAMIN B12 DEFICIENCY (ICD-266.2)    Route: IM    Site: L deltoid    Exp Date: 01/2012    Lot #: [id number]    Mfr: [employer]    Comments: Pt will return in 3 months for next injection.  Pt should have been receiving injections every 3 months according to Dr. Marvell Fuller office note on 03/07/10.    Patient tolerated injection without complications    Given by: Francee Piccolo CMA Duncan Dull) (June 27, 2010 3:19 PM)  Orders Added: 1)  Vit B12 1000 mcg [J3420]

## 2011-01-17 NOTE — Assessment & Plan Note (Signed)
Summary: Monthly B12, 266.2  Nurse Visit   Allergies: 1)  Demerol (Meperidine Hcl) 2)  Morphine Sulfate (Morphine Sulfate)  Medication Administration  Injection # 1:    Medication: Vit B12 1000 mcg    Diagnosis: VITAMIN B12 DEFICIENCY (ICD-266.2)    Route: IM    Site: L deltoid    Exp Date: 07/2011    Lot #: [id number]    Mfr: [employer]    Patient tolerated injection without complications    Given by: Merri Ray CMA (AAMA) (February 24, 2010 3:01 PM)  Orders Added: 1)  Vit B12 1000 mcg [J3420]

## 2011-01-17 NOTE — Progress Notes (Signed)
Summary: lab results and plans  Phone Note Outgoing Call   Summary of Call: let her know labs overall ok mildl increase glucose and BUN but not a problem 1) stop iron 2) stay on q 3 month B12 3) see me in 6 mos routinely 4) we cced Megan Mans and Kelly 5) Have fun in [place] MD, [facility]  November 06, 2010 12:29 PM   Follow-up for Phone Call        pt notified of the above.  She is greatful for item #1, she is agreeable with #2-4 and laughs at item #5, and will do her best to comply! REV recall in [facility] for 04/2011, B12 previously scheduled for 12/2010. Follow-up by: Francee Piccolo CMA Duncan Dull),  November 07, 2010 3:55 PM

## 2011-01-17 NOTE — Assessment & Plan Note (Signed)
Summary: 266.2/sheri  Nurse Visit   Allergies: 1)  Demerol (Meperidine Hcl) 2)  Morphine Sulfate (Morphine Sulfate)  Medication Administration  Injection # 1:    Medication: Vit B12 1000 mcg    Diagnosis: VITAMIN B12 DEFICIENCY (ICD-266.2)    Route: IM    Site: L deltoid    Exp Date: 11/12    Lot #: [id number]    Mfr: [employer]    Patient tolerated injection without complications    Given by: Milford Cage NCMA (January 06, 2010 3:24 PM)  Orders Added: 1)  Vit B12 1000 mcg [J3420]

## 2011-01-17 NOTE — Assessment & Plan Note (Signed)
Summary: 266.2/sheri  Nurse Visit   Allergies: 1)  Demerol (Meperidine Hcl) 2)  Morphine Sulfate (Morphine Sulfate)  Medication Administration  Injection # 1:    Medication: Vit B12 1000 mcg    Diagnosis: VITAMIN B12 DEFICIENCY (ICD-266.2)    Route: IM    Site: R deltoid    Exp Date: 11/12    Lot #: [id number]    Mfr: [employer]    Comments: #3 B12 injection next week    Patient tolerated injection without complications    Given by: Milford Cage NCMA (January 13, 2010 1:56 PM)  Orders Added: 1)  Vit B12 1000 mcg [J3420]

## 2011-01-17 NOTE — Progress Notes (Signed)
Summary: Next lab appt  Phone Note Call from Patient Call back at Home Phone [phone]   Caller: Patient Call For: Dr. Leone Payor Reason for Call: Talk to Nurse Summary of Call: Wants to know when she is due to come in for bloodwork Initial call taken by: Karna Christmas,  September 20, 2010 2:04 PM  Follow-up for Phone Call        LM to [facility] at home number  (no clear lab needs, need REV now, b12 on 10/10) Francee Piccolo CMA Duncan Dull)  September 21, 2010 9:03 AM   2nd call to pt..advised her that I can not see where she is due for labs.  Advised she is due for an REV which she said is scheudled for 11/18.  Pt will come on 10/10 for B12.  We will add Flu shot to this at pt's request.   Follow-up by: Francee Piccolo CMA Duncan Dull),  September 22, 2010 4:32 PM

## 2011-01-19 NOTE — Assessment & Plan Note (Signed)
Summary: B12 266.2, 3 month schedule  Nurse Visit   Allergies: 1)  Demerol (Meperidine Hcl) 2)  Morphine Sulfate (Morphine Sulfate)  Medication Administration  Injection # 1:    Medication: Vit B12 1000 mcg    Diagnosis: VITAMIN B12 DEFICIENCY (ICD-266.2)    Route: IM    Site: L deltoid    Exp Date: 09/17/2012    Lot #: [id number]    Mfr: American Regent    Comments: Patient asked to call back in 3 months to schedule q 3 month b 12    Patient tolerated injection without complications    Given by: Darcey Nora RN, CGRN (December 27, 2010 3:39 PM)  Orders Added: 1)  Vit B12 1000 mcg [J3420]

## 2011-03-21 ENCOUNTER — Ambulatory Visit (INDEPENDENT_AMBULATORY_CARE_PROVIDER_SITE_OTHER): Payer: Medicare Other | Admitting: Internal Medicine

## 2011-03-21 DIAGNOSIS — E538 Deficiency of other specified B group vitamins: Secondary | ICD-10-CM

## 2011-03-21 MED ORDER — CYANOCOBALAMIN 1000 MCG/ML IJ SOLN
1000.0000 ug | INTRAMUSCULAR | Status: DC
  Administered 2011-03-21 – 2011-09-20 (×3): 1000 ug via INTRAMUSCULAR

## 2011-03-30 DIAGNOSIS — Z9289 Personal history of other medical treatment: Secondary | ICD-10-CM

## 2011-03-30 HISTORY — DX: Personal history of other medical treatment: Z92.89

## 2011-05-02 NOTE — Assessment & Plan Note (Signed)
[facility]                         GASTROENTEROLOGY OFFICE NOTE   NAME:Shipper, LUCIE FRIEDLANDER                     MRN:          [id number]  DATE:10/07/2007                            DOB:          09/13/1922    CHIEF COMPLAINT:  Followup of change in bowels.   Ms. Mamone has done pretty well with only 4 spells in the last 6 weeks.  Lasagna, Boston Cream [place], Mc Chicken, and coffee and currant jelly  pastry triggered spells of bloating, distension, and then subsequent  lose stools for about an hour.  At one time she took Lomotil with  relief.  In one of the episodes, she had not taken her Ultrase MT, but  all in all she has been doing that.  She is using occasional Lomotil.  Her weight is stable to increased.  Her Hemoccults were negative.   PAST MEDICAL HISTORY:  Reviewed and unchanged from previous notes.   Weight 188 pounds, pulse 60, blood pressure 102/60.   ASSESSMENT:  I think she has some form of pancreatic  insufficiency/irritable bowel problems.  She had a colonoscopy several  years ago that was okay.  She is over 82 and, though she had a change in  her bowel habits, her weight is stable to improved, and her symptoms are  significantly better.   PLAN:  To follow up with me in January, will go ahead and check a CT  scan 6 months from the time of her last imaging (MRI) to make sure  nothing has changed in the pancreas with regard to the possibility of  pancreatic malignancy, though I doubt it.  We will hold off on a  colonoscopy at this point.   Continue other medications.     Iva Boop, MD,FACG  Electronically Signed    CEG/MedQ  DD: 10/07/2007  DT: 10/08/2007  Job #: [id number]   cc:   Nicki Guadalajara, M.D.  Angus G. Renard Matter, MD

## 2011-05-02 NOTE — Assessment & Plan Note (Signed)
[facility]                         GASTROENTEROLOGY OFFICE NOTE   NAME:Enloe, ELANORA QUIN                     MRN:          [id number]  DATE:07/18/2007                            DOB:          11-21-1922    CHIEF COMPLAINT:  Followup of abdominal pain, diarrhea, pancreatic  lesion.   PROBLEMS:  See my past medical history list from June 04, 2007.   Active problems are;  1. Possible pancreatic lesion, CT scan thought to represent fatty      pancreatic atrophy and volume averaging.  2. Abdominal pain and diarrhea.  3. Atrial fibrillation, awaiting possible cardioversion.   MEDICATIONS:  Listed and reviewed in the chart. She has had profafenone  added and Ultrase MP with meals and snacks added since last visit.   She thinks 2 weeks of Ultrase MT has helped but she is not sure. She has  still has had 3 spells in 2 weeks of burping, and belching, and crampy  abdominal pain followed with a sensation of constipation, then an  opening up of loose stools. She ate a doughnut recently and had a spell.  She is having a little trouble sorting out exactly what the causes are,  though they are usually in the morning. When she had the doughnut she  had forgotten to take her medication.   Dr. Tresa Endo is awaiting possible cardioversion and does not want to hold  her Coumadin. Her weight is relatively stable, down 2 pounds from her  last visit though, she is 89 now. She was 89 several years ago, so she  is about the same. She wants to take a trip on August 9, a 2 week bus  trip and is concerned about risks of having diarrhea etc during that  period.   VITAL SIGNS: Weight 196, pulse 64 and regular, blood pressure 110/62.   ASSESSMENT:  Abdominal pain, diarrhea, question cause. I think it may be  pancreatic insufficiency. The possible pancreatic lesion is discussed.  There is still a possibly that she had early pancreatic cancer. At her  age and health risks a  Whipple would be extremely risky. This was  discussed with her. She is concerned about the lack of definitive  diagnosis at this time it seems. She is not inclined to pursue a  colonoscopy on Coumadin, I think that could be helpful.   PLAN:  1. MRI of the pancreas to better clarify this lesion. This was      recommended by radiology.  2. Continue Ultrase MP 12 with meals and snacks.  3. Add Lomotil, if she is going to go on a trip this may be useful      rescue for her symptoms.  4. Hold off on colonoscopy at this time.   There was 25 minutes time spent with the patient, over half of which was  in counseling and coordination of care.   Note, I will discuss the results of the MRI and follow up on her  symptoms next week. We are both going on vacations starting August 9.  She may still need a colonoscopy. We have got  some other issues to sort  through first. She will also try to keep a diary of her spells and foods  associated with it, etc.  I will give her low fat diet information as  well.     Iva Boop, MD,FACG  Electronically Signed    CEG/MedQ  DD: 07/18/2007  DT: 07/18/2007  Job #: [id number]

## 2011-05-02 NOTE — Assessment & Plan Note (Signed)
[facility]                         GASTROENTEROLOGY OFFICE NOTE   NAME:[name]                     MRN:          [id number]  DATE:08/28/2007                            DOB:          1922/10/26    CHIEF COMPLAINT:  Follow-up of MRI, diarrhea.   Ms. Plain's MRI did not show a mass.  Perhaps it was fatty infiltration  or volume averaging, according to the radiologist.  She went on her trip  to the [place] okay.  She had a cardioversion a week ago and then on Monday  she went back into atrial fibrillation and she also had a spell of  diarrhea and bloating.  She did not take her Ultrase MT before that but  can have problems when she does not does not, perhaps.  Overall she has  been better with the Ultrase MT.  She used a little bit of Lomotil.  Her  weight is down 10 pounds.  She is anorexic.  She is somewhat anhedonic,  it sounds like.  She must remain on Coumadin, it looks like.  Other  medications are listed and reviewed in the chart.   Weight 186 pounds, pulse 60, blood pressure 112/70.   ASSESSMENT:  Diarrhea problems.  Overall she is better.  There is  bloating and gaseousness.  She has had some weight loss with anorexia.  Imaging studies have not shown anything serious as best I can tell.   PLAN:  A colonoscopy is considered.  She is not interested in that at  this time and understands the possibility of malignancy.  I think that  is unlikely but possible.  We are going to go ahead and try Hemoccults.  Certainly if this is positive, we will perform a colonoscopy.  We will  need to get clearance to come off the Coumadin.  Will continue the  Ultrase MT.  Consider a trial of antibiotics, i.e. question bacterial  overgrowth.  Further plans pending the clinical course.  I will see her  back in several weeks.  If she continues to lose weight and have  problems, I think an endoscopic workup is going to be necessary.     Iva Boop,  MD,FACG  Electronically Signed    CEG/MedQ  DD: 08/28/2007  DT: 08/29/2007  Job #: [id number]   cc:   Nicki Guadalajara, M.D.  Angus G. Renard Matter, MD

## 2011-05-02 NOTE — Cardiovascular Report (Signed)
NAMEFAVIOLA, KLARE              ACCOUNT NO.:  [id number]   MEDICAL RECORD NO.:  [id number]          PATIENT TYPE:  OIB   LOCATION:  2899                         FACILITY:  [facility]   PHYSICIAN:  Thereasa Solo. Little, M.D. DATE OF BIRTH:  Apr 29, 1922   DATE OF PROCEDURE:  08/21/2007  DATE OF DISCHARGE:                            CARDIAC CATHETERIZATION   INDICATIONS FOR TEST:  This 77 year old female has a long history of  PAF.  She had been on amiodarone but developed elevated liver function  studies.  She has subsequently been on Rythmol but has been in atrial  fibrillation at least since December.  She has a therapeutic INR which  is 2.9 today.  Her left atrial dimension is 5.5 cm by echo June 2008,  and her ejection fraction was 45% as a nonischemic myopathy.  Her  potassium is 4.1.  A resting EKG shows atrial fib with rate of 70.  Left  bundle branch block with PVCs.   With anesthesia's presence, the patient was given a total of 150 mg of  IV Pentothal.  Once an appropriate level of anesthesia was obtained, she  underwent elective cardioversion.  Anterior-posterior pads were used.  We used 200 watt seconds and the patient converted with a single shock  from atrial fib with a rate of 70 to sinus rhythm with a rate of 68.  She will follow up with Dr. Tresa Endo in the office.   CONCLUSION:  Successful cardioversion.           ______________________________  Thereasa Solo Little, M.D.     ABL/MEDQ  D:  08/21/2007  T:  08/21/2007  Job:  [id number]   cc:   Cath Lab

## 2011-05-02 NOTE — Assessment & Plan Note (Signed)
[facility]                         GASTROENTEROLOGY OFFICE NOTE   NAME:Pucillo, VANIYAH LANSKY                     MRN:          [id number]  DATE:06/04/2007                            DOB:          1922-07-22    REFERRING PHYSICIAN:  Angus G. McInnis, MD   REASON FOR CONSULTATION:  Abnormal CT.   ASSESSMENT:  An 77 year old white woman, who presented to Dr. Renard Matter  with crampy lower abdominal pain and loose bowel movements.  A CT scan  of the abdomen and pelvis has shown a 1.7 by 0.8 cm possible low density  in the inferior aspect of the uncinate process in the pancreas.  I have  personally viewed the films.   It is not clear to me that this lesion is causing her symptoms, though  if it is a hormonal tumor, such as a neuro-endocrine tumor, it could be  causing these problems.  It does need further workup.   PLAN:  1. CT of the abdomen with pancreatic protocol to try to understand if      there really is a lesion here.  2. Subsequent to that, if that is negative, this lady should have a      colonoscopy and possible upper GI endoscopy.  3. Endoscopic ultrasound imaging and possible fine-needle aspiration      or biopsy could be indicated.  4. The lady takes Coumadin, so she is at increased risk of problems      with procedures, as she would need to hold her Coumadin prior to      any invasive procedure and potential invasive biopsy or polypectomy      (normal endoscopic mucosal biopsies are possible on Coumadin).   HISTORY:  As above.  See my medical history report for further details.  Essentially, for the past six months or so, she has had crampy lower  quadrant abdominal pain that has been intermittent and followed by loose  bowel movements, several in a row.  I do not think she has lost any  weight.  There does not seem to have been any bleeding.  It is not  really a nocturnal problem that I am aware of.  It is definitely a new  symptom for  her.  The defecation occurs 15-30 minutes after eating, as  do the cramps.  Her amiodarone was stopped around Christmas time, due to  abnormal LFTs.  There have been no new medications introduced.   MEDICATIONS:  1. Diovan/HCT 100/60 mg daily.  2. Warfarin per protocol.  3. Metoprolol 50 mg daily.  4. Celebrex 200 mg four a week.  5. Aspirin 81 mg on the days she is not taking Celebrex.  6. Lanoxin 125 micrograms daily.  7. Calcium 1200 with vitamin D daily.  8. Lutein 20 mg daily.  9. Tramadol 50 mg every 4-6 hours.   DRUG ALLERGIES:  MORPHINE and DEMEROL have caused nausea and vomiting.   PAST MEDICAL HISTORY:  Atrial fibrillation, osteoarthritis, previous  tubal ligation.  Abnormal liver function tests, secondary to amiodarone.   NOTE:  Her cardiologist is Dr. Daphene Jaeger.  FAMILY HISTORY:  Heart disease.  No colon cancer reported.   SOCIAL HISTORY:  She is a retired Education administrator.  She used to live  in [place], but now lives in [place], but still has her primary  care in [place].  She is widowed.  She lives alone.  No alcohol,  tobacco or drugs.   REVIEW OF SYSTEMS:  See my medical history form for full details.   PHYSICAL EXAM:  Height 5 feet 7, weight 198 pounds, blood pressure  92/62, pulse 80.  EYES:  Anicteric.  ENT:  Normal mouth and oropharynx.  NECK:  Supple, no mass.  CHEST:  Clear.  HEART:  S1, S2, no murmurs or gallops.  The heart has an irregular  rhythm.  ABDOMEN:  Soft, nontender, no organomegaly or mass.  RECTAL EXAM:  Deferred.  LYMPHATIC:  No neck or supraclavicular nodes.  EXTREMITIES:  No edema.  SKIN:  Warm, dry, no acute rash.  PSYCHIATRIC:  She is alert and oriented times three.   I have reviewed the previous records from Dr. Renard Matter, the CT scanning  and old records from Dr. Tresa Endo in the chart.  The patient's hemoglobin  is 14.9 on labs of April 24.  Her CMET was normal, except for a BUN of  24.  TSH normal.     Iva Boop, MD,FACG  Electronically Signed    CEG/MedQ  DD: 06/05/2007  DT: 06/05/2007  Job #: [id number]   cc:   Nicki Guadalajara, M.D.
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
## 2011-05-05 NOTE — Discharge Summary (Signed)
NAME:  [name], [name]                        ACCOUNT NO.:  [id number]   MEDICAL RECORD NO.:  [id number]                   PATIENT TYPE:  INP   LOCATION:  5041                                 FACILITY:  [facility]   PHYSICIAN:  Ellwood Dense, MD                  DATE OF BIRTH:  Jan 07, 1922   DATE OF ADMISSION:  09/26/2002  DATE OF DISCHARGE:  10/08/2002                                 DISCHARGE SUMMARY   DISCHARGE DIAGNOSES:  1. Open reduction internal fixation of right forearm fracture with plate and     graft.  2. Right humeral head fracture.  3. Chronic atrial fibrillation.  4. Mild anemia.   HISTORY OF PRESENT ILLNESS:  The patient is a 77 year old female with a  history of hypertension and atrial fibrillation who fell on 09/19/02,  sustaining comminuted distal right radius fracture and right radius ulnar  fracture, and right humeral head fracture with deformity.  Evaluated by Dr.  Otelia Sergeant, and the patient underwent ORIF of right forearm of radius and ulna  with plate and graft on 09/20/02.  Also had examination of right shoulder  under anesthesia.  Postoperatively, has had decreased in sensation in right  ring and small finger.  No range of motion of right shoulder per ortho.  She  has had pleuritic chest pain.  Spiral CT scan done on 09/25/02, was negative  for pulmonary embolism.  Coumadin resumed for atrial fibrillation, and  physical therapy initiated.  The patient is minimal assist for transfers,  minimal assist for ambulating 15 feet x2 with shortness of breath with  activity.   PAST MEDICAL HISTORY:  1. C2 fracture in the past, treated with Miami collar.  2. Chronic atrial fibrillation with cardioversion x2 without success.  3. Depression.  4. Left total knee replacement.  5. Bladder tack.  6. Decreased hearing.  7. Lumbar decompression.  8. Ovarian cyst.  9. Neuropathy bilaterally.   ALLERGIES:  1. MORPHINE.  2. DEMEROL.   SOCIAL HISTORY:  The patient lives alone  in two level home, two to three  steps at entry.  Has bedroom on first level.  Uses alcohol occasionally, no  tobacco.  Daughter can provide some assistance at discharge.   HOSPITAL COURSE:  The patient was admitted to rehabilitation on 09/26/02,  for inpatient therapies to consist of physical therapy and occupational  therapy daily.  Past admission, the patient was maintained on iron for her  postoperative anemia, and OxyContin CR was started for improvement in pain  control.  Labs done past admission showed a hemoglobin of 11.3, hematocrit  34.3, white count 4.2, platelets of 52.  Sodium 140, potassium 4.9, chloride  105, CO2 25, BUN 11, creatinine 0.9, glucose 132.  UAUC done showed UA was  clear.  Urine cultures showed multi-species.  Followup of electrolytes on  10/07/02, showed sodium 144, potassium 3.9, chloride 108, CO2 27, BUN 14,  creatinine 0.9, glucose 96.  The patient has been maintained on Coumadin  throughout her stay.  This has been therapeutic currently on 2 mg  alternating with 1 mg q.o.d.  The PT/INR at time of discharge is 25.0 and  2.7.  The patient will have next prothrombin time checked on 10/10/02, with  the results to Dr. Landry Dyke office.   The patient had some discomfort with left upper extremity bilateral stent.  This was changed out with improvement in her symptoms.  She does continue  with decrease in sensation of right third, fourth, and fifth fingers.  Physical therapy was initiated, and currently the patient is at modified  independence for bed mobility, modified independent for transfers, modified  independent for ambulating 100 feet with small based quad cane.  She is able  to navigate 5 stairs with distant supervision.  Currently, the patient is  modified independent to minimal assist for upper body bathing, modified  independent for lower body bathing, moderate assist for upper body dressing,  modified independent for lower body dressing.  Further  followup to include  home health physical therapy and occupational therapy, as well as aid by  [facility].  Home health R.N. has been arranged for prothrombin  times.  On 10/08/02, the patient is discharged to home.   DISCHARGE MEDICATIONS:  1. Coumadin 2 mg alternating with 1 mg q.o.d.  2. OxyContin CR 20 mg p.o. b.i.d. x1 week, 20 mg q.d. x1 week, then     discontinue.  3. Zoloft 25 mg q.d.  4. Protonix 40 mg q.d.  5. Toprol XL 50 mg 1/2 p.o. q.d.  6. Senokot S two p.o. b.i.d.  7. Neurontin 300 mg q.h.s.   ACTIVITY:  No lifting right shoulder.   DIET:  Regular.   WOUND CARE:  Keep area clean and dry.  Keep splint dry.   SPECIAL INSTRUCTIONS:  1. No alcohol, no smoking, no driving.  2. [facility] to provide physical therapy and occupational therapy     with R.N.  3. To not use Prinivil or Verapamil.  4. May use Tylenol in addition for pain.   FOLLOWUP:  1. The patient is to follow up with Dr. Otelia Sergeant for an appointment in 7 to 10     days.  2.     Dr. Tresa Endo for check of blood pressure and heart rate in one week.  3. Dr. Renard Matter for routine check.  4. Dr. Ellwood Dense as needed.     Dian Situ, P.A.          Ellwood Dense, MD    PP/MEDQ  D:  10/08/2002  T:  10/09/2002  Job:  [id number]   cc:   W. Ashley Royalty., M.D.  [address]  [place]  [place] [address]  Fax: [phone]   Angus G. Renard Matter, M.D.   Kerrin Champagne, M.D.  [address]  [place]  [place] [address]  Fax: [phone]

## 2011-05-05 NOTE — Op Note (Signed)
NAME:  [name], [name]                        ACCOUNT NO.:  [id number]   MEDICAL RECORD NO.:  [id number]                   PATIENT TYPE:  INP   LOCATION:  5041                                 FACILITY:  [facility]   PHYSICIAN:  Kerrin Champagne, M.D.                DATE OF BIRTH:  [birth year]   DATE OF PROCEDURE:  DATE OF DISCHARGE:                                 OPERATIVE REPORT   PREOPERATIVE DIAGNOSES:  Comminuted right distal both bones forearm  fracture, open, grade 1.  Right impacted humeral neck fracture, with  angulation.   POSTOPERATIVE DIAGNOSES:  Comminuted right distal both bones forearm  fracture, open, grade 1.  Right impacted humeral neck fracture, with  angulation.   PROCEDURE PERFORMED:  Incision with excisional debridement of open wound,  right distal forearm and open reduction, internal fixation of right distal  both bone forearm fracture using a 9-hole, 3.5 Synthes locking plate to the  radius, and a 5-hole one-third semi-tubular plate to the ulna.  Examination  of the right shoulder under anesthesia.   SURGEON:  Kerrin Champagne, M.D.   ASSISTANT:  Roma Schanz, P.A.   ANESTHESIA:  GOT, Dr. Beverly Gust. [place]   ESTIMATED BLOOD LOSS:  75 cc.   DRAINS:  TLS x 1, right forearm and Foley catheter to straight drain.   BRIEF CLINICAL HISTORY:  The patient is a 77 year old female who fell at  home last evening.  She is presently on chronic Coumadin and chronic aspirin  maintenance, INR of 3.0.  She was admitted and placed on vitamin K.  Her PT  has gradually normalized over the last 12 to 18 hours and she is brought to  the operating room to undergo irrigation and debridement of the wound, with  open reduction, internal fixation.  Examination of her right shoulder  revealed a neck fracture that appears to be angulated and impacted.  Radiographs are not of good quality so the examination was necessary under  fluoro.   OPERATIVE FINDINGS:  Comminuted right  distal radius and ulna fracture,  requiring bone grafting in the radius and ulna itself was fixed with a one-  third semi-tubular plate, the radius with a 9-hole locking plate.  Cortical  screws were used throughout.  Cancellous chips were used to bone graft the  fracture side of radius.  The patient had examination of the humerus under  anesthesia demonstrating impacted humeral neck fracture in adequate position  and alignment, less than 45 degrees of angulation on any plane, which  represents a stable fracture. A shoulder immobilizer was felt to be  necessary for treatment of this condition.   DESCRIPTION OF PROCEDURE:  After adequate general anesthesia right upper  extremity prepped from fingertips to the upper arm with Duraprep solution.  A sterile tourniquet was used.  Standard drape, standard preoperative  antibiotics.  The patient has been on Ancef since her entry into  the  hospital.  The right arm was exsanguinated with Esmarch bandage and  tourniquet inflated to 250 mmHg about the right upper arm.  The incision,  standard anterior approach to the right distal volar forearm was used.  Incision directly over the flexor carpi radialis tendon, extending from near  its insertion near the proximal of the wrist creases for a length of  approximately 12 to 15 cm through skin and subcutaneous layers down directly  down to the tendon sheath.  This was then incised and the tendon retracted  medially.  Incision then made through the pronator quadratus muscle directly  down to the radius distally and fracture site identified.  Exposure obtained  over the distal fracture fragments as well as proximal fracture fragments.  Periosteal attachments maintained and those loose fragments about the  fracture site.  A small window was found to be present at the fracture site.  It could be removed.  Bone graft was performed into the fracture site with  reduction under C-arm fluoro.  With the fracture then  reduced and bone  grafted, a 9-hole, 3.5 locking plate was contoured to the distal volar  radius.  This was then placed against the radius and a single screw was  first attempted to be placed to the variable screw hole, however, the  fracture was comminuted over a distance of more than 2 or 3 cm.  The  variable screw holes could not be used for the initial placement of the  plate and C-arm fluoro was used to ascertain the correct length of the plate  in its relationship to the joint distally.  The blade was then held steady,  and a drill hole then placed and a single 3.5 screw hole placed to the most  proximal of the screw holes for fixation.  The plate was then carefully  aligned along the radius and drill holes were then placed into the  transverse three holes distally, first in the styloid measuring the depth  and placing the appropriate size screw, then the middle and lateral screw  holes.  Each of these obtained excellent purchase on the dorsal cortex bone,  as well as volar cortex bone.  Two additional screws were placed into the  proximal screw holes of the proximal fracture fragment and formed a  diaphysis with the plate, leaving a single hole open proximally.  Additional  screw holes were then placed through the variable screw holes obtaining  purchase across the fracture site with good dorsal cortex bone.  This  provided excellent internal fixation on the distal radius and good position  and alignment.  The patient has had a previous history of radius fracture  with shortening and loss of the normal distal radial carpal volar tilt.  She  also has had a previous scaphoid fracture with loss of the proximal pole of  the scaphoid.  With fixation, incision was made over the superficial portion  of the ulna extending from the ulnar styloid proximally over an area of  about 8 cm.  The superficial branches of the ulnar nerve were identified and preserved.  They were carefully elevated off  the underlying periosteum.  The  periosteum was incised over the ulna, over the superficial dorsal ulnar  aspect of the ulna.  The periosteum was then carefully dissected.  The  fracture was reduced and held in place with reducing tenaculum placed  against the ulnar.  Two distal screws were able to be placed gaining  purchase on  the ulna distal fracture fragment and three screws were placed  proximally fixing the hole in good position and alignment.  The ulna also  had comminuted significantly.  Overall position and alignment above the  radius and the ulna was felt to be acceptable.  Irrigation was performed.  The ulna wound was then closed with interrupted 2-0 Vicryl sutures with care  taken not to include the ulnar superficial sensory branch nerves dorsally.  The radius site was carefully irrigated and attempts were made to carefully  approximate the pronator quadratus muscle over the plate.  Subcu layers were  then approximated over a TLS drain using 2-0 Vicryl sutures and the skin  closed with stainless steel staples.  The area of the previous open wound  was irrigated and debrided before ORIF and this was done by eclipsing the  small wound that represented a 2 mm puncture wound completely and irrigating  it appropriately.  This wound was then closed over the proximal and distal  extents leaving the central area open with two vertical mattress sutures of  4-0 nylon.  Xeroform gauze was then placed over this area and over both the  staple lines; 4 x 4s, ABD pads were fixed to the skin with sterile Webril  and well padded sugar-tong splint applied to the right forearm.  The TLS  drain was charged.  C-arm fluoro was used throughout the forearm case to  ascertain reduction and position of alignment of the screws.  It was then  used to evaluate the right shoulder.  With the C-arm in place the arm was  brought into neutral and into rotation.  The humeral neck was found to be  impacted with  only approximately 20 degrees of abduction.  With the arm then  abducted, externally rotated, there appeared to be complete impaction here,  with angulation of these planes.  This was felt to be an acceptable impacted  humeral neck fracture.  The patient was placed in a shoulder immobilizer  with the right wrist elevated above the heart.  She was re-activated and  returned to the recovery room in satisfactory condition.  All instrument and  sponge counts were correct.  The tourniquet was released prior to closure of  the radial wounds and small bleeders controlled using bipolar  electrocautery.                                                Kerrin Champagne, M.D.   Myra Rude  D:  09/20/2002  T:  09/22/2002  Job:  [id number]

## 2011-05-05 NOTE — Cardiovascular Report (Signed)
[place]  Patient:    [name], [name]                     MRN: [id number] Proc. Date: 03/25/01 Adm. Date:  [id number] Attending:  Virgina Evener CC:         Lennette Bihari, M.D.   Cardiac Catheterization  PROCEDURES:  Transesophageal echocardiogram-guided direct current cardioversion.  CARDIOLOGIST:  Lenise Herald, M.D.  COMPLICATIONS:  None.  INDICATIONS:  [name] is a 77 year old white female patient of Dr. Daphene Jaeger admitted with new onset atrial fibrillation.  She is now referred for TEE-guided DC cardioversion.  DESCRIPTION OF OPERATION:  After giving informed consent, the patient brought to the endoscopy suite in the fasting state.  The patient underwent uncomplicated transesophageal echocardiogram.  This revealed normal LV size with mildly reduced LV systolic function, estimated EF of 40 to 45% with septal dyskinesis.  The aortic valve appeared structurally normal with trivial to mild aortic regurgitation.  There was mild thickening of the anterior mitral valve leaflet with mild to moderate regurgitation.  Tricuspid valve appears structurally normal with mild tricuspid regurgitation.  There was moderate left atrial enlargement.  There were no intracardiac masses or thrombi noted.  There was a small ASD noted with a minimal amount of left-to-right shunting by color Doppler but no right-to-left shunting by contrast echo.  Normal descending thoracic aorta.  Following TEE, the patient was then given 100 mg pentothal by Dr. Krista Blue. After adequate anesthesia was determined, the patient received one shock at 300 joules with conversion to normal sinus rhythm.  She awoke alert and oriented with no apparent complication.  She was transported to the recovery room in stable condition.  CONCLUSION:  Successful transesophageal echocardiogram-guided direct current cardioversion. DD:  03/25/01 TD:  03/25/01 Job:  [id number]
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
## 2011-05-05 NOTE — Discharge Summary (Signed)
[place]. [facility]  Patient:    [name], [name]                     MRN: [id number] Adm. Date:  [id number] Disc. Date: 03/26/01 Attending:  Virgina Evener Dictator:   [facility] [place], [place].A.                           Discharge Summary  ADMISSION DIAGNOSES:  1. Breakthrough atrial fibrillation.  2. Left bundle branch block.  3. History of paroxysmal atrial fibrillation.  4. Chronic back pain.  5. History of positive tilt-table study.  6. History of presyncope.  7. History of Persantine Cardiolite, March 19, 2001, with small inferoapical     scar, no ischemia.  8. Echocardiogram, August 24, 1999, which revealed ejection fraction 35 to     40%.  Left atrial enlargement.  9. Status post catheterization on October 27, 1993, with normal coronary     arteries, ejection fraction 40%. 10. Nonischemic cardiomyopathy.  DISCHARGE DIAGNOSES:  1. Breakthrough atrial fibrillation.  2. Left bundle branch block.  3. History of paroxysmal atrial fibrillation.  4. Chronic back pain.  5. History of positive tilt-table study.  6. History of presyncope.  7. History of Persantine Cardiolite, March 19, 2001, with small inferoapical     scar, no ischemia.  8. Echocardiogram, August 24, 1999, which revealed ejection fraction 35 to     40%.  Left atrial enlargement.  9. Status post catheterization on October 27, 1993, with normal coronary     arteries, ejection fraction 40%. 10. Nonischemic cardiomyopathy. 11. Status post Coumadin load. 12. Status post atrial fibrillation now maintaining normal sinus rhythm. 13. Status post transesophageal echocardiogram and DC cardioversion by Lenise Herald, M.D. on March 25, 2001, with cardioversion x 1 at 300 joules     converting to normal sinus rhythm.  HISTORY OF PRESENT ILLNESS:  [name] is a 77 year old white divorced female with prior history of paroxysmal atrial fibrillation, normal coronaries,  LV dysfunction, EF 35 to 40% since 1994.  She presented on March 21, 2001.  She stated that on the last few days she had shortness of breath, increased stress as she was currently moving to [place].  On the previous day she had called the office complaining of these symptoms.  She was told to come to the office on the day of March 21, 2001.  However, she arrived to the office early and wanted to be seen immediately which could not be done as the physicians were not at the office yet.  Therefore she left the office and came to the emergency room.  She stated on the night prior to admission she had nausea as well as diaphoresis and also vomiting and diarrhea.  That morning she had increased shortness of breath.  As well she had questionable chills as well as weakness for the last two to three days.  On examination at that time her blood pressure was 118/71 and her heart rate was 100 and she was in atrial fibrillation.  She had crackles and rhonchi at the bases of her lungs, but she had no lower extremity edema.  Her heart was in irregular rhythm, the rest of her examination was essentially benign.  At that time it was felt that she had breakthrough atrial fibrillation despite being on amiodarone.  She was not on Coumadin at the time of admission.  She was planned for admission with IV heparin as well as p.o. Coumadin.  As well she was given IV Lasix and we added Cardizem.  We would also increase the amiodarone 200 to 300 today for further rate control.  We would also consider TE cardioversion.  HOSPITAL COURSE:  March 22, 2001, [name] was breathing much improved.  As well she was not aware of an irregular heartbeat at that point.  Her cardiac enzymes were negative x 3.  Her chest x-ray from the previous day showed cardiomegaly with mild CHF bilateral left lower atelectasis versus pneumonia. This will be followed up.  At that point the Lasix was changed to p.o. and her Cardizem was also  changed from IV to p.o.  She was continued on IV heparin while we loaded Coumadin.  Beta blocker was also added for additional rate control.  On March 23, 2001, Ms. Kon continued to feel improved and was less short of breath.  She continued to be in atrial fibrillation in the 60s to 70s.  INR was still low at 1.1 and she was continued on heparin with her Coumadin.  On March 24, 2001, she continued to feel improved and was scheduled for TE cardioversion on the following day, Monday.  On Monday, March 25, 2001, [name] underwent TEE by Lenise Herald, M.D. This revealed EF 40 to 45%.  Structurally normal aortic valve, trivial mild regurgitation, mild to moderate regurgitation of the mitral valve.  Mild regurgitation of tricuspid.  No intracardial mass or thrombi noted.  There was a small ASD noted with minimal left to right shunting and no right to left shunting by color and contrast echo.  Because there was no thrombi noted, Dr. Jenne Campus then proceeded with DC cardioversion.  At the beginning of the procedure she was in atrial fibrillation at 78 beats per minute.  He direct current cardioverted x 1 at 300 joules and she converted to normal sinus rhythm 60 beats per minute with blood pressure 93/66.  On March 26, 2001, [name] was maintaining normal sinus rhythm.  She is stable with no complaints.  She is now off heparin and her INR is actually supertherapeutic.  She is afebrile at 98.4, blood pressure 100/55, heart rate 65.  Her labs are all stable.  Follow-up chest x-ray from yesterday showed mild atelectasis, but not pneumonia.  It is felt that she is stable for discharge home at this time and she has been deemed stable for discharge home by Lenise Herald, M.D.  CONSULTING PHYSICIANS:  None.  PROCEDURE: 1. Transesophageal echocardiogram, March 25, 2001, by Lenise Herald, M.D.    This revealed the following:    a. Normal LV size with mildly depressed systolic function,  estimated       EF 40 to 45%, septal dyskinesis.    b. Structurally normal aortic valve with trivial/mild regurgitation.     c. Mildly thickened anterior mitral valve leaflets with mild/moderate       regurgitation.    d. Structureely normal tricuspid valve with mild regurgitation.    e. Moderate left anterior enlargement.    f. No intracardial mass/thrombi noted.    g. Small ASD noted with minimal left to right shunting and no right to       left shunting by color and contrast echo.    h. Normal descending thoracic aorta. 2. Direct current cardioversion by Lenise Herald, M.D. on March 25, 2001.  At    the beginning of the procedure the  patient was in atrial fibrillation at 78    beats per minute.  He then performed direct current cardioversion x 1 at    300 joules with conversion to normal sinus rhythm at 60 beats per minute.  LABORATORY DATA:  Cardiac enzymes negative x 3 with CKs of 82, 64, and 88.  MB of 1.8, 1.5, 0.9.  Troponin of less than 0.01 x 3.  At the time of admission on March 21, 2001, electrolytes shows sodium 145, potassium 4.1, glucose 123, BUN 24, creatinine 0.9.  These remained stable and within this same range throughout the hospitalization.  At the time of discharge home on March 26, 2001, sodium 137, potassium 3.7, glucose 97, BUN 21, creatinine 1.2.  At time of admission CBC showed WBC 4.6, hemoglobin 14.6, hematocrit 44.0, platelets 195.  At time of discharge WBC 3.3, hemoglobin 13.1, hematocrit 39.9, and platelets 208.  Again these remained stable throughout the hospitalization. PT/INRs are as follows; On admission on March 21, 2001, PT 12.6, INR 1.0, PTT 26.  On March 22, 2001, INR 1.2.  On March 23, 2001, INR is 1.1.  On March 24, 2001, INR is 1.4.  On March 25, 2001, INR 2.2.  On March 26, 2001, INR 3.7.  RADIOLOGY:  Chest x-ray on March 21, 2001, shows cardiomegaly with mild CHF, probably bilateral lower lobe atelectasis or atelectic pneumonia.  Chest x-ray March 22, 2001, shows interval improvement of bibasilar interstitial edematous changes, improved bibasilar aeration with residual left lower lobe atelectasis/infiltrate and bilateral pleural effusions.  Chest x-ray March 25, 2001, shows cardiomegaly with pulmonary edematous hypertension.  Bronchitic changes with minimal interstitial prominence.  EKG on admission showed atrial fibrillation, 99 beats per minute.  EKG at time of discharge showed sinus bradycardia at 53 beats per minute, left bundle branch block.  DISCHARGE MEDICATIONS:  1. Neurontin 300 mg as needed.  2. Protonix 40 mg once a day.  3. Celebrex 200 mg once a day.  4. Zoloft 50 mg once a day.  5. Aspirin 81 mg once a day.  6. Calcium 600 mg with vitamin D.  7. Cardizem CD 240 mg once a day.  8. Lasix 40 mg once a day.  9. Toprol XL 12.5 mg once a day. 10. Zestril 5 mg once a day. 11. Amiodarone 300 mg once a day. 12. Coumadin take none today, starting tomorrow take 2.5 mg once a day.  May apply hydrocortisone cream to chest as needed.  Have blood drawn to check PT/INR on Thursday on second floor by Loma Newton, M.D.  I have contacted the office regarding an appointment with Dr. Tresa Endo.  They are to check the schedule and call the patient with an appointment to see Dr. Tresa Endo in approximately two to three weeks. DD:  03/26/01 TD:  03/26/01 Job: [id number]
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
## 2011-05-05 NOTE — Op Note (Signed)
NAME:  [name], [name]                        ACCOUNT NO.:  [id number]   MEDICAL RECORD NO.:  [id number]                   PATIENT TYPE:  AMB   LOCATION:  DAY                                  FACILITY:  [facility]   PHYSICIAN:  Timothy E. Earlene Plater, M.D.              DATE OF BIRTH:  Jan 09, 1922   DATE OF PROCEDURE:  06/10/2004  DATE OF DISCHARGE:                                 OPERATIVE REPORT   PREOPERATIVE DIAGNOSIS:  Right inguinal hernia.   POSTOPERATIVE DIAGNOSIS:  Right direct and indirect inguinal hernia.   OPERATION/PROCEDURE:  Repair of hernia with mesh.   SURGEON:  Timothy E. Earlene Plater, M.D.   ANESTHESIA:  General.   INDICATIONS:  Mrs. Limones is 82 but she has an enlarging and more and more  symptomatic right inguinal hernia and after careful consideration and  consultation, she wishes to proceed with surgery.  She has been seen and  evaluated by Dr. Renard Matter in [place] and Dr. Daphene Jaeger in [place].  She  has a history of hypertension and atrial fibrillation, presently is on  Coumadin.  The Coumadin has been stopped.  Her PT/PTT are normal today.  The  patient is ready to proceed with the surgery.  She is identified and the  proper side marked and identified and permit signed.   DESCRIPTION OF PROCEDURE:  She is taken to the operating room and placed  supine position.  LMA anesthesia provided.  The right groin is prepped and  draped in the usual fashion.  Marcaine 0.5% with epinephrine is used  throughout for a wide field block.  A generous incision is made in the skin  crease in the right groin.  Very abundant fatty tissue is dissected.  In  fact the subcutaneous fat pad was 5 cm deep.  The external oblique fascia  was identified and dissected widely and opened in line with its fibers to  the external ring.  A large fatty bowel-filled indirect hernia was present  down to the pubic tubercle and as well the floor of the canal was weakened  and separated.  The hernia sac was  dissected free and reduced back through  the internal ring.  The floor of the canal was approximated with a running 0  Prolene.  A large piece of mesh was cut and fashioned to fit the entire  femoral canal and was sutured to the shelving edge of the inguinal ligament  laterally and the rectus sheath medially up to and superior to the internal  ring.  This completed the repair.  The external oblique was closed with a  running 0 PDS suture.  Subcutaneous was carefully inspected and no bleeding  was present.  Skin was closed with 3-0 Monocryl.  Counts correct.  She  tolerated it well, was extubated and taken to the recovery room.   The patient will be observed.  If she is stable, she will be discharged home  as planned.  Vicodin and instructions given.  She will be followed in the  office.                                               Timothy E. Earlene Plater, M.D.    TED/MEDQ  D:  06/10/2004  T:  06/10/2004  Job:  [id number]   cc:   Angus G. Renard Matter, M.D.  [address]  [place]  [place] [address]  Fax: [phone]   Nicki Guadalajara, M.D.  [phone] [address], [address]  [place], [place] [address]  Fax: [phone]
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
## 2011-05-29 ENCOUNTER — Ambulatory Visit (HOSPITAL_COMMUNITY): Payer: Medicare Other | Admitting: Internal Medicine

## 2011-05-29 ENCOUNTER — Encounter: Payer: Self-pay | Admitting: Internal Medicine

## 2011-05-29 ENCOUNTER — Inpatient Hospital Stay (HOSPITAL_COMMUNITY)
Admission: AD | Admit: 2011-05-29 | Discharge: 2011-06-03 | DRG: 379 | Disposition: A | Discharge status: Home / Self Care | Payer: Medicare Other | Source: Ambulatory Visit | Attending: Internal Medicine | Admitting: Internal Medicine

## 2011-05-29 DIAGNOSIS — I4891 Unspecified atrial fibrillation: Secondary | ICD-10-CM | POA: Diagnosis present

## 2011-05-29 DIAGNOSIS — K8689 Other specified diseases of pancreas: Secondary | ICD-10-CM

## 2011-05-29 DIAGNOSIS — Z7901 Long term (current) use of anticoagulants: Secondary | ICD-10-CM | POA: Insufficient documentation

## 2011-05-29 DIAGNOSIS — K921 Melena: Principal | ICD-10-CM | POA: Diagnosis present

## 2011-05-29 DIAGNOSIS — I498 Other specified cardiac arrhythmias: Secondary | ICD-10-CM | POA: Diagnosis present

## 2011-05-29 DIAGNOSIS — K922 Gastrointestinal hemorrhage, unspecified: Secondary | ICD-10-CM | POA: Insufficient documentation

## 2011-05-29 DIAGNOSIS — K589 Irritable bowel syndrome without diarrhea: Secondary | ICD-10-CM

## 2011-05-29 LAB — CBC
HCT: 38.6 % (ref 36.0–46.0)
Hemoglobin: 12.3 g/dL (ref 12.0–15.0)
MCH: 29.9 pg (ref 26.0–34.0)
MCHC: 31.9 g/dL (ref 30.0–36.0)
RBC: 4.11 MIL/uL (ref 3.87–5.11)

## 2011-05-29 LAB — COMPREHENSIVE METABOLIC PANEL
Alkaline Phosphatase: 57 U/L (ref 39–117)
BUN: 27 mg/dL — ABNORMAL HIGH (ref 6–23)
GFR calc Af Amer: >60 mL/min (ref >60)
GFR calc non Af Amer: 56 mL/min — ABNORMAL LOW (ref >60)
Glucose, Bld: 95 mg/dL (ref 70–99)
Potassium: 3.4 mEq/L — ABNORMAL LOW (ref 3.5–5.1)
Total Bilirubin: 0.5 mg/dL (ref 0.3–1.2)
Total Protein: 5.9 g/dL — ABNORMAL LOW (ref 6.0–8.3)

## 2011-05-29 LAB — TYPE AND SCREEN: ABO/RH(D): B POS

## 2011-05-29 LAB — ABO/RH: ABO/RH(D): B POS

## 2011-05-29 NOTE — Assessment & Plan Note (Signed)
She says INR 2.9 last week On this for A fib and will hold for now til EGD done

## 2011-05-29 NOTE — Patient Instructions (Signed)
You will be admitted to [facility]  

## 2011-05-29 NOTE — Assessment & Plan Note (Signed)
Stable on supplements

## 2011-05-29 NOTE — Assessment & Plan Note (Signed)
[name], she is on warfarin and Celebrex. There has been some noncompliance with PPI. Small gastric ulcer in the past. She had done well overall with this triple therapy to help her osteo-arthritis symptoms and stay on warfarin, but at this point need to hold Celebrex and warfarin admitted to the hospital and have upper endoscopy performed to evaluate for cause of bleeding.

## 2011-05-29 NOTE — Progress Notes (Signed)
  Subjective:    Patient ID: [name], female    DOB: December 11, 1922, 77 y.o.   MRN: [id number]  HPI is an 77 year old white woman who takes Celebrex warfarin and a PPI. She has a known history of a small gastric ulcer and melena in 2010, colonoscopy did not show any cause of melena or iron deficiency at that time. She had done well but in April she stopped her Prevacid for a period of time and had some transient melena. She restarted it and the melena resolved. She recently in the dose or 2 of her Prevacid but reports one week of melena, dark tarry stool with red tinged water when it hits the commode. She has not had presyncope or chest pain or significant dyspnea. She did have loose stools today and feels a little weak overall. She drove herself to the office today without difficulty.  She says her INR was 2.9 last week when checked at Dr. Landry Dyke office.    Review of Systems Recently took an overnight trip to [facility] without difficulty, she has chronic arthritis pains helped by the Celebrex. Her GI symptoms have been stable otherwise. No reports of cardiac difficulties. All other ROS negative.    Objective:   Physical Exam  Constitutional: She is oriented to person, place, and time. She appears well-developed and well-nourished.       Elderly NAD  HENT:  Head: Normocephalic and atraumatic.  Mouth/Throat: No oropharyngeal exudate.  Eyes: Conjunctivae are normal. Pupils are equal, round, and reactive to light. No scleral icterus.  Neck: Normal range of motion. Neck supple. No JVD present. No tracheal deviation present. No thyromegaly present.  Cardiovascular:       Irregular rhythm, normal rate  Pulmonary/Chest: Effort normal and breath sounds normal.  Abdominal: Soft. Bowel sounds are normal. She exhibits no distension and no mass. There is no tenderness.  Genitourinary:       Female staff present Melena, confirmed heme ++++  Lymphadenopathy:    She has no cervical adenopathy.    Neurological: She is alert and oriented to person, place, and time.  Skin: Skin is warm and dry.  Psychiatric: She has a normal mood and affect. Her behavior is normal.          Assessment & Plan:

## 2011-05-29 NOTE — Assessment & Plan Note (Signed)
Stable

## 2011-05-30 DIAGNOSIS — K921 Melena: Secondary | ICD-10-CM

## 2011-05-30 HISTORY — PX: UPPER GASTROINTESTINAL ENDOSCOPY: SHX188

## 2011-05-30 LAB — HEMOGLOBIN AND HEMATOCRIT, BLOOD
HCT: 35.8 % — ABNORMAL LOW (ref 36.0–46.0)
HCT: 35.7 % — ABNORMAL LOW (ref 36.0–46.0)
Hemoglobin: 11.5 g/dL — ABNORMAL LOW (ref 12.0–15.0)

## 2011-05-30 LAB — PROTIME-INR
INR: 2.47 — ABNORMAL HIGH (ref 0.00–1.49)
Prothrombin Time: 26.9 seconds — ABNORMAL HIGH (ref 11.6–15.2)

## 2011-05-31 DIAGNOSIS — K921 Melena: Secondary | ICD-10-CM

## 2011-05-31 LAB — CBC
MCH: 30.1 pg (ref 26.0–34.0)
MCV: 94.1 fL (ref 78.0–100.0)
Platelets: 188 10*3/uL (ref 150–400)
RDW: 13.3 % (ref 11.5–15.5)

## 2011-05-31 LAB — HEMOGLOBIN AND HEMATOCRIT, BLOOD: HCT: 33.9 % — ABNORMAL LOW (ref 36.0–46.0)

## 2011-06-01 DIAGNOSIS — K921 Melena: Secondary | ICD-10-CM

## 2011-06-01 LAB — CBC
HCT: 37.9 % (ref 36.0–46.0)
HCT: 37.3 % (ref 36.0–46.0)
Hemoglobin: 12 g/dL (ref 12.0–15.0)
MCV: 95 fL (ref 78.0–100.0)
MCV: 94.2 fL (ref 78.0–100.0)
Platelets: 209 10*3/uL (ref 150–400)
RBC: 3.99 MIL/uL (ref 3.87–5.11)
RDW: 13.4 % (ref 11.5–15.5)
WBC: 5.5 10*3/uL (ref 4.0–10.5)
WBC: 5.5 10*3/uL (ref 4.0–10.5)

## 2011-06-01 LAB — BASIC METABOLIC PANEL
BUN: 15 mg/dL (ref 6–23)
CO2: 22 mEq/L (ref 19–32)
Chloride: 112 mEq/L (ref 96–112)
Creatinine, Ser: 0.78 mg/dL (ref 0.4–1.2)
Potassium: 3.6 mEq/L (ref 3.5–5.1)

## 2011-06-02 DIAGNOSIS — K5730 Diverticulosis of large intestine without perforation or abscess without bleeding: Secondary | ICD-10-CM

## 2011-06-02 HISTORY — PX: COLONOSCOPY: SHX174

## 2011-06-02 LAB — CBC
Hemoglobin: 11.3 g/dL — ABNORMAL LOW (ref 12.0–15.0)
MCH: 30.6 pg (ref 26.0–34.0)
MCHC: 32.7 g/dL (ref 30.0–36.0)
MCV: 93.8 fL (ref 78.0–100.0)

## 2011-06-03 ENCOUNTER — Inpatient Hospital Stay (HOSPITAL_COMMUNITY): Payer: Medicare Other

## 2011-06-03 DIAGNOSIS — K5731 Diverticulosis of large intestine without perforation or abscess with bleeding: Secondary | ICD-10-CM

## 2011-06-03 LAB — CBC
HCT: 34 % — ABNORMAL LOW (ref 36.0–46.0)
MCH: 30.5 pg (ref 26.0–34.0)
MCHC: 32.1 g/dL (ref 30.0–36.0)
MCV: 95.2 fL (ref 78.0–100.0)
Platelets: 190 10*3/uL (ref 150–400)
RDW: 14.1 % (ref 11.5–15.5)
WBC: 4.9 10*3/uL (ref 4.0–10.5)

## 2011-06-04 HISTORY — PX: OTHER SURGICAL HISTORY: SHX169

## 2011-06-05 ENCOUNTER — Telehealth: Payer: Self-pay | Admitting: Internal Medicine

## 2011-06-05 NOTE — Telephone Encounter (Signed)
I called again to speak with the patient as I have not had a return call from DrKelly;s office, they did call the patient back.  They have increased her lasix to 20 mg daily x 3 days and they are scheduling her to come in and have a heart monitor due to bradycardia while inpatient.  She is advised of the follow up appt with Dr Leone Payor for 07/03/11 3:15.  She is asked to call back for any further GI questions.

## 2011-06-05 NOTE — Telephone Encounter (Signed)
Per D/C summary patient to follow up in 3-4 weeks.  I have scheduled her for 07/03/11 3:15.  I have left a message for her to call back

## 2011-06-05 NOTE — Telephone Encounter (Signed)
Patient was in the hospital over the weekend for a GI bleed.  She calls this am to report a 15 lb weight gain, lower extremity edema, SOB, and a cough.  She has not been taking her lasix, but she did take the first dose this am 20 mg.  She is also taking Diovan 80/12.5.  She asks that I contact her cardiologist Dr Tresa Endo and report what occurred over the weekend and her current symptoms.  I have left a message with Dr Landry Dyke office to call me back.

## 2011-06-07 NOTE — Telephone Encounter (Signed)
Ok

## 2011-06-19 ENCOUNTER — Ambulatory Visit (INDEPENDENT_AMBULATORY_CARE_PROVIDER_SITE_OTHER): Payer: Medicare Other | Admitting: Internal Medicine

## 2011-06-19 DIAGNOSIS — E538 Deficiency of other specified B group vitamins: Secondary | ICD-10-CM

## 2011-07-03 ENCOUNTER — Other Ambulatory Visit (INDEPENDENT_AMBULATORY_CARE_PROVIDER_SITE_OTHER): Payer: Medicare Other

## 2011-07-03 ENCOUNTER — Ambulatory Visit (INDEPENDENT_AMBULATORY_CARE_PROVIDER_SITE_OTHER): Payer: Medicare Other | Admitting: Internal Medicine

## 2011-07-03 DIAGNOSIS — M1990 Unspecified osteoarthritis, unspecified site: Secondary | ICD-10-CM

## 2011-07-03 DIAGNOSIS — D62 Acute posthemorrhagic anemia: Secondary | ICD-10-CM

## 2011-07-03 DIAGNOSIS — K922 Gastrointestinal hemorrhage, unspecified: Secondary | ICD-10-CM

## 2011-07-03 DIAGNOSIS — Z7901 Long term (current) use of anticoagulants: Secondary | ICD-10-CM

## 2011-07-03 LAB — CBC WITH DIFFERENTIAL/PLATELET
Basophils Relative: 0.7 % (ref 0.0–3.0)
Eosinophils Absolute: 0.2 10*3/uL (ref 0.0–0.7)
HCT: 41 % (ref 36.0–46.0)
Lymphs Abs: 1.4 10*3/uL (ref 0.7–4.0)
MCHC: 34 g/dL (ref 30.0–36.0)
MCV: 93.1 fl (ref 78.0–100.0)
Monocytes Absolute: 0.6 10*3/uL (ref 0.1–1.0)
Neutrophils Relative %: 61.9 % (ref 43.0–77.0)
Platelets: 252 10*3/uL (ref 150.0–400.0)

## 2011-07-03 LAB — FERRITIN: Ferritin: 26.1 ng/mL (ref 10.0–291.0)

## 2011-07-03 MED ORDER — CELECOXIB 200 MG PO CAPS: 200.0000 mg | ORAL_CAPSULE | Freq: Every day | ORAL | Status: AC

## 2011-07-03 NOTE — Discharge Summary (Signed)
[name], [name]              ACCOUNT NO.:  [id number]  MEDICAL RECORD NO.:  [id number]  LOCATION:  1424                         FACILITY:  [facility]  PHYSICIAN:  Barbette Hair. Arlyce Dice, MD,FACGDATE OF BIRTH:  April 12, 1922  DATE OF ADMISSION:  05/29/2011 DATE OF DISCHARGE:  06/04/2011                              DISCHARGE SUMMARY   PRIMARY GASTROENTEROLOGIST:  Stan Head, MD  DISPOSITION:  Home in stable condition.  DISCHARGE MEDICATIONS:  Continue home medications including: 1. Prevacid 30 mg 1 tablet daily. 2. Lutein 20 mg 1 tablet daily. 3. Calcium with vitamin D 1 tablet daily. 4. Systane eyedrops. 5. Vitamin D3 1000 units 1 tablet daily. 6. Lasix 20 mg 1 tablet daily as needed. 7. Creon 24,000 units one tablet three times daily with snacks. 8. Digoxin 0.125 mg daily. 9. Bystolic 5 mg 1/2 tablet daily. 10.Warfarin 2 mg takes 1 tablet on Sunday, Tuesday, Wednesday,     Thursday and Saturday; take 1-1/2 tablets on Monday and Friday. 11.Restasis eyedrops in both eyes twice daily. 12.Diovan HCT 80/12.5 mg one tablet daily.  CONSULTATIONS:  None requested.   DISCHARGE DIAGNOSIS:  1.  Gastrointestinal bleed in setting of Coumadin. Unrevealing EGD, small     bowel video capsule study and colonoscopy. Bleeding resolved and felt to be      diverticular in nature.  2.  Bradycardia, asymptomatic. Resolved. Heart rate fell into 30's at one point      this admission.  3.  Atrial Fibrillation, maintained on Digoxin and Comadin. 4.  History of PUD, small gastric ulcer 2010. 5.  Pancreatic insufficiency, on pancreatic enzymes  PROCEDURES: 1. EGD by Dr. Arlyce Dice. 2. Small bowel video capsule study. 3. colonoscopy by Dr. Arlyce Dice.  HOSPITAL COURSE:  Ms. Hodgkins is an 77 year old female with a history of PUD  who was admitted from our office on 05/29/11 for melena/rectal bleeding and weakness.  She had apparently been taking Celebrex, was on Coumadin as well.  On the day of admission,  the patient reported a 1-week history of melena with red tinged water in the commode. She was admitted to [facility] for  further evaluation and treatment. On admission, the patient was given clear liquids, started on IV fluids. Most of her home medications with the exception of Coumadin were continued.  Her admitting hemoglobin was 12.3 and never fell below 11 this admission.  Her evaluation began with an EGD by Dr. Arlyce Dice. Since upper endoscopy was normal and patient had been colonoscoped within the last two years, a  small bowel video capsule study was done and negative for any small bowel findings. There was however, a question of some old blood in the cecum. Because the patient was  on chronic Coumadin, we felt it was important to evaluate the right colon for any source of bleeding. Patient had a colonoscopy by Dr. Arlyce Dice 06/02/11. Except for mild  sigmoid diverticulosis, the colon was normal to the terminal ileum, no blood was seen.  Ms. Craghead's bleed was presumed to be diverticular in nature. She had continued to pass dark blood on the second and third days of admission but since her hemoglobin and vital signs remained stable it was felt to  represent old blood. This hospital  stay, Ms. Gertsch had an episode of asymptomatic bradycardia with heart rate  in the 30s. By June 03, 2011, the patient had not had any further bleeding and was stable for discharge.  She was advised to resume her home Coumadin.  INR on the day of discharge with 1.4. Because of recent bleeding, we felt it was best that the patient's INR became therapeutic at a very gradual pace.  She was advised to avoid NSAIDs and follow up with Dr. Leone Payor within 3 to 4 weeks.     Willette Cluster, NP   ______________________________ Barbette Hair. Arlyce Dice, MD,FACG    PG/MEDQ  D:  06/03/2011  T:  06/03/2011  Job:  [id number]  Electronically Signed by Willette Cluster NP on 06/19/2011 03:17:40 PM Electronically Signed by Melvia Heaps MDFACG on 07/03/2011 08:34:48 AM

## 2011-07-03 NOTE — Assessment & Plan Note (Addendum)
Restart Celebrex as pain is impairing quality of life. She understands possibility of recurrent bleeding but wishes to try this.

## 2011-07-03 NOTE — Assessment & Plan Note (Signed)
Hgb 10 1 month ago at discharge. Still has some DOE and edema, ? related Needs CBC and ferritin today.

## 2011-07-03 NOTE — Assessment & Plan Note (Addendum)
Etiology was unclear but is resolved. Back on anticoagulation for stroke prophylaxis.

## 2011-07-03 NOTE — Patient Instructions (Signed)
Please go to the basement upon leaving today to have your labs done. We will contact you with assessment and plans after we receive the results of your labs. Your prescription(s) has(have) been sent to your pharmacy for you to pick up (Celebrex). Follow up with Dr. Leone Payor in 1 year.

## 2011-07-03 NOTE — Progress Notes (Signed)
  Subjective:    Patient ID: [name], female    DOB: May 07, 1922, 77 y.o.   MRN: [id number]  HPI 77 year old white woman status post admission to the hospital about one month ago for GI bleeding.   Review of Systems Wearing heart monitor for bradycardia, also having edema Having some dyspnea with exertion. Wants to get back on Celebrex. Gets joint pains without it.     Objective:   Physical Exam Well-developed in acute distress Lungs clear Heart S1-S2 I hear no rubs murmurs or gallops normal rate Abdomen soft and nontender without mass or tenderness or organomegaly Lower extremities show 1+ edema She is awake and alert mood and affect are appropriate       Assessment & Plan:

## 2011-07-04 ENCOUNTER — Encounter: Payer: Self-pay | Admitting: Internal Medicine

## 2011-07-04 NOTE — Progress Notes (Signed)
Quick Note:  Hgb normal but needs to start ferrous sulfate 325 mg daily Will recheck iron levels within 6 months - I can do when she returns in 6 months (REV in 6 months) Cc Dr. Daphene Jaeger and Dr. Renard Matter ______

## 2011-07-04 NOTE — Assessment & Plan Note (Signed)
Back on warfarin. Benefits outweigh risks at this point.

## 2011-07-05 ENCOUNTER — Telehealth: Payer: Self-pay

## 2011-07-05 MED ORDER — FERROUS SULFATE 325 (65 FE) MG PO TABS: 325.0000 mg | ORAL_TABLET | Freq: Every day | ORAL | Status: DC

## 2011-07-05 NOTE — Telephone Encounter (Signed)
Message copied by Michele Mcalpine on Wed Jul 05, 2011 11:21 AM ------      Message from: Iva Boop      Created: Tue Jul 04, 2011  6:55 PM       Hgb normal but needs to start ferrous sulfate 325 mg daily      Will recheck iron levels within 6 months - I can do when she returns in 6 months (REV in 6 months)      Cc Dr. Daphene Jaeger and Dr. Renard Matter

## 2011-07-05 NOTE — Telephone Encounter (Signed)
Pt aware and rx sent to pharmacy. 

## 2011-07-05 NOTE — Telephone Encounter (Signed)
Message copied by Michele Mcalpine on Wed Jul 05, 2011 10:27 AM ------      Message from: Iva Boop      Created: Tue Jul 04, 2011  6:55 PM       Hgb normal but needs to start ferrous sulfate 325 mg daily      Will recheck iron levels within 6 months - I can do when she returns in 6 months (REV in 6 months)      Cc Dr. Daphene Jaeger and Dr. Renard Matter

## 2011-09-10 ENCOUNTER — Other Ambulatory Visit: Payer: Self-pay | Admitting: Internal Medicine

## 2011-09-20 ENCOUNTER — Ambulatory Visit (INDEPENDENT_AMBULATORY_CARE_PROVIDER_SITE_OTHER): Payer: Medicare Other | Admitting: Internal Medicine

## 2011-09-20 DIAGNOSIS — E538 Deficiency of other specified B group vitamins: Secondary | ICD-10-CM

## 2011-09-21 ENCOUNTER — Telehealth: Payer: Self-pay | Admitting: Gastroenterology

## 2011-09-21 NOTE — Telephone Encounter (Signed)
When patient came in for her Vitamin B 12 injection she asked for a flu shot. ok' ed per Lavonna Rua I called the patient al left a message on voice mail for her to came back so we could schedule the flu shot appointment.

## 2011-09-22 ENCOUNTER — Ambulatory Visit (INDEPENDENT_AMBULATORY_CARE_PROVIDER_SITE_OTHER): Payer: Medicare Other | Admitting: Internal Medicine

## 2011-09-22 DIAGNOSIS — Z23 Encounter for immunization: Secondary | ICD-10-CM

## 2011-09-22 NOTE — Telephone Encounter (Signed)
Appointment scheduled for today at 3:00 pm

## 2011-09-29 LAB — PROTIME-INR
INR: 2.3 — ABNORMAL HIGH
Prothrombin Time: 25.8 — ABNORMAL HIGH

## 2011-10-02 LAB — CREATININE, SERUM: GFR calc non Af Amer: 42 — ABNORMAL LOW

## 2011-11-21 ENCOUNTER — Telehealth: Payer: Self-pay | Admitting: Internal Medicine

## 2011-11-21 NOTE — Telephone Encounter (Signed)
Left message for patient to call back  

## 2011-11-21 NOTE — Telephone Encounter (Signed)
Patient is c/o black stools, but the water turns pink with BM.  She stopped her celebrex several months ago, but is on coumadin.  She reports she noticed the black stools over the weekend and it is getting worse.  She has had a history of GI bleed this summer.  She will come in and see Mike Gip PA tomorrow at 10:30

## 2011-11-22 ENCOUNTER — Ambulatory Visit (INDEPENDENT_AMBULATORY_CARE_PROVIDER_SITE_OTHER): Payer: Medicare Other | Admitting: Physician Assistant

## 2011-11-22 ENCOUNTER — Encounter: Payer: Self-pay | Admitting: Physician Assistant

## 2011-11-22 ENCOUNTER — Other Ambulatory Visit (INDEPENDENT_AMBULATORY_CARE_PROVIDER_SITE_OTHER): Payer: Medicare Other

## 2011-11-22 ENCOUNTER — Other Ambulatory Visit: Payer: Self-pay | Admitting: *Deleted

## 2011-11-22 VITALS — BP 110/50 | HR 68 | Ht 66.0 in | Wt 186.4 lb

## 2011-11-22 DIAGNOSIS — K625 Hemorrhage of anus and rectum: Secondary | ICD-10-CM

## 2011-11-22 DIAGNOSIS — Z7901 Long term (current) use of anticoagulants: Secondary | ICD-10-CM

## 2011-11-22 DIAGNOSIS — Z8719 Personal history of other diseases of the digestive system: Secondary | ICD-10-CM

## 2011-11-22 LAB — CBC WITH DIFFERENTIAL/PLATELET
Eosinophils Relative: 1.4 % (ref 0.0–5.0)
HCT: 38.8 % (ref 36.0–46.0)
Hemoglobin: 13 g/dL (ref 12.0–15.0)
Lymphs Abs: 1 10*3/uL (ref 0.7–4.0)
MCV: 93.1 fl (ref 78.0–100.0)
Monocytes Absolute: 0.4 10*3/uL (ref 0.1–1.0)
Monocytes Relative: 8.9 % (ref 3.0–12.0)
Neutro Abs: 3.3 10*3/uL (ref 1.4–7.7)
Platelets: 229 10*3/uL (ref 150.0–400.0)
WBC: 4.8 10*3/uL (ref 4.5–10.5)

## 2011-11-22 LAB — PROTIME-INR
INR: 2.9 ratio — ABNORMAL HIGH (ref 0.8–1.0)
Prothrombin Time: 32.6 s — ABNORMAL HIGH (ref 10.2–12.4)

## 2011-11-22 NOTE — Progress Notes (Signed)
Reviewed and agree with current management. The patient does not want to be hospitalized and per her request we will follow as outpatient for now. If bleeding worsens, Hb drops significantly, she has weakness, dizziness or any other new complaints she will need to be hospitalized. [name]. Russella Dar MD Clementeen Graham

## 2011-11-22 NOTE — Progress Notes (Signed)
Subjective:    Patient ID: [name], female    DOB: 04/11/1922, 77 y.o.   MRN: [id number]  HPI [name] is a very nice 77 year old white female known to Dr. Leone Payor who has history of chronic atrial fibrillation and is maintained on Coumadin. She also has history of previous GI bleed and was hospitalized in June of 2012. At that time she underwent upper endoscopy which was normal, then colonoscopy which was pertinent only for mild diverticulosis in the sigmoid colon. There was no active bleeding noted at that time. [name] also underwent capsule endoscopy which was unremarkable.  She was taken off of her Coumadin for a couple of weeks after her bleed and then resumed it. She has done fine in the interim. She also has history of iron deficiency anemia and has been maintained on iron. She noted onset 4-5 days ago with very dark stools with a pinkish rim in the water. She says she's been having one bowel movement a day generally but did have 2 yesterday. She has continued her Coumadin but stopped her iron. She denies any abdominal pain or cramping. She says she does feel a little bit tired but has not been dizzy or lightheaded. She has had some very vague gassiness  or vague nausea. She was trying to hold out on coming to the doctors because she has been watching her grandson who is 14 with Down's  syndrome She also has a son who is having back surgery tomorrow.  She has not been on any aspirin nor any NSAIDs over the past several months.    Review of Systems  HENT: Negative.   Eyes: Negative.   Respiratory: Negative.   Cardiovascular: Negative.   Gastrointestinal: Positive for blood in stool.  Genitourinary: Negative.   Musculoskeletal: Positive for arthralgias.  Skin: Negative.   Neurological: Positive for weakness.  Hematological: Negative.   Psychiatric/Behavioral: Negative.    Outpatient Prescriptions Prior to Visit  Medication Sig Dispense Refill  . Calcium 1200-1000 MG-UNIT CHEW  Take 1 by mouth once daily       . Cholecalciferol (VITAMIN D3) 400 UNITS CAPS Take 1 capsule by mouth daily.        . cycloSPORINE (RESTASIS) 0.05 % ophthalmic emulsion Place 1 drop into both eyes 2 (two) times daily.        . digoxin (LANOXIN) 0.125 MG tablet Take 125 mcg by mouth daily.        . furosemide (LASIX) 20 MG tablet Take 20 mg by mouth daily.        . lansoprazole (PREVACID) 30 MG capsule TAKE 1 TABLET 30 MINUTES BEFORE BREAKFAST  30 capsule  11  . Lutein 20 MG TABS Take 1 tablet by mouth daily.        . nebivolol (BYSTOLIC) 5 MG tablet Take 2.5 mg by mouth daily.        . Pancrelipase, Lip-Prot-Amyl, (CREON) 24000 UNITS CPEP Take by mouth. Take 1 tablet by mouth three times a day ac meals and snacks       . valsartan-hydrochlorothiazide (DIOVAN-HCT) 80-12.5 MG per tablet Take 1 tablet by mouth daily.        Marland Kitchen warfarin (COUMADIN) 2 MG tablet as directed.        . ferrous sulfate 325 (65 FE) MG tablet Take 1 tablet (325 mg total) by mouth daily.  30 tablet  3   Facility-Administered Medications Prior to Visit  Medication Dose Route Frequency Provider Last Rate Last Dose  .  cyanocobalamin ((VITAMIN B-12)) injection 1,000 mcg  1,000 mcg Intramuscular Q90 days Iva Boop, MD   1,000 mcg at 09/20/11 1459   Allergies  Allergen Reactions  . Meperidine Hcl     REACTION: nausea: VOMIT  . Morphine Sulfate     REACTION: nausea: "VOMIT"       Objective:   Physical Exam Well-developed elderly white female in no acute distress, pleasant, Blood pressure 110/50, pulse 68. HEENT; nontraumatic, normocephalic EOMI PERRLA, sclera anicteric. Neck; supple no JVD. Cardiovascular; regular rate and rhythm with S1-S2 soft systolic murmur, Pulmonary; clear bilaterally, Abdomen; soft, nontender, nondistended, bowel sounds active no palpable mass or hepatosplenomegaly. Rectal; very dark maroonish stool in rectal vault. Extremities; no clubbing cyanosis or edema. Psych; mood and affect normal and  appropriate.        Assessment & Plan:  #40 77 year old white female with recurrent GI bleeding in setting of chronic anticoagulation with Coumadin. She has very dark maroonish stool today which is most suggestive of diverticular hemorrhage. She is currently very stable and does not want to be hospitalized. Plan; Will check stat CBC and protime INR. Discussed very likely need of hospitalization with the patient however will wait for her labs to make final decision. I have asked her to stop her Coumadin. We will call her this afternoon after we have her labs back and she will go home and rest in the interim.  Addendum; her hemoglobin is 13 today, INR is 2.9. I have discussed with Dr. Russella Dar and he agrees that she does not need to be hospitalized at this time. She will stop her Coumadin and come back to the lab in the morning for repeat hemoglobin and hematocrit. I have spoken with her and she is aware that  if she has any increase in frequency of stools or development of weakness, dizziness etc. that she will require admission. Given her advanced age and history of 2 GI bleeds within a 6 month period we may also need to discuss discontinuing her Coumadin altogether with her cardiologist.

## 2011-11-22 NOTE — Patient Instructions (Signed)
Please go to the basement level to have your labs drawn.   We will call you with the results and you will hear from [facility].

## 2011-11-23 ENCOUNTER — Telehealth: Payer: Self-pay | Admitting: *Deleted

## 2011-11-23 ENCOUNTER — Ambulatory Visit: Payer: Medicare Other

## 2011-11-23 ENCOUNTER — Other Ambulatory Visit: Payer: Self-pay | Admitting: *Deleted

## 2011-11-23 DIAGNOSIS — K625 Hemorrhage of anus and rectum: Secondary | ICD-10-CM

## 2011-11-23 DIAGNOSIS — D649 Anemia, unspecified: Secondary | ICD-10-CM

## 2011-11-23 LAB — CBC WITH DIFFERENTIAL/PLATELET
Eosinophils Relative: 1.2 % (ref 0.0–5.0)
Lymphocytes Relative: 15.8 % (ref 12.0–46.0)
Monocytes Absolute: 0.4 10*3/uL (ref 0.1–1.0)
Monocytes Relative: 8.2 % (ref 3.0–12.0)
Neutrophils Relative %: 74.5 % (ref 43.0–77.0)
Platelets: 223 10*3/uL (ref 150.0–400.0)
WBC: 5.1 10*3/uL (ref 4.5–10.5)

## 2011-11-23 NOTE — Telephone Encounter (Signed)
Called pt and advised her HGB is 12.1.  She is feeling tired and has had several episodes of bloody stools today.  Amy Esterwood PA-C told me to have her come in for an office visit tomorrow and stop by the lab first.  She is coming to the lab at 10 AM tomorrow 11-24-11 and to our office for an appointment at 10 :30 AM.  I did put the labs in EPIC today STAT.

## 2011-11-23 NOTE — Telephone Encounter (Signed)
Message copied by Derry Skill on Thu Nov 23, 2011  1:54 PM ------      Message from: Mike Gip S      Created: Thu Nov 23, 2011  1:18 PM       Please call pt, her hgb is 12.1, still good, see how she is feeling , and if she has had any more bloody stools-thanks

## 2011-11-24 ENCOUNTER — Encounter: Payer: Self-pay | Admitting: Physician Assistant

## 2011-11-24 ENCOUNTER — Ambulatory Visit (INDEPENDENT_AMBULATORY_CARE_PROVIDER_SITE_OTHER): Payer: Medicare Other | Admitting: Physician Assistant

## 2011-11-24 ENCOUNTER — Other Ambulatory Visit (INDEPENDENT_AMBULATORY_CARE_PROVIDER_SITE_OTHER): Payer: Medicare Other

## 2011-11-24 VITALS — BP 118/60 | HR 66 | Ht 66.0 in | Wt 184.0 lb

## 2011-11-24 DIAGNOSIS — K922 Gastrointestinal hemorrhage, unspecified: Secondary | ICD-10-CM

## 2011-11-24 DIAGNOSIS — D649 Anemia, unspecified: Secondary | ICD-10-CM

## 2011-11-24 DIAGNOSIS — R195 Other fecal abnormalities: Secondary | ICD-10-CM

## 2011-11-24 LAB — CBC
HCT: 35.6 % — ABNORMAL LOW (ref 36.0–46.0)
RBC: 3.86 Mil/uL — ABNORMAL LOW (ref 3.87–5.11)
WBC: 4.1 10*3/uL — ABNORMAL LOW (ref 4.5–10.5)

## 2011-11-24 LAB — PROTIME-INR: Prothrombin Time: 24.8 s — ABNORMAL HIGH (ref 10.2–12.4)

## 2011-11-24 NOTE — Progress Notes (Signed)
Agree with [name]'s assessment and plan. Carl E. Gessner, MD, FACG   

## 2011-11-24 NOTE — Patient Instructions (Signed)
Come to the lab on Monday 11-27-2011 AM. We will call you with the results.  Stay off Coumadin this week.  Hold the iron supplements also this week. Call us if you have any problems.  If it is on the weekend, call and you can get the MD on call.

## 2011-11-24 NOTE — Progress Notes (Signed)
  Subjective:    Patient ID: CHESTER ROMERO, female    DOB: 1922-02-05, 77 y.o.   MRN: [id number]  HPI Nathali is very nice 76 year old white female, who is seen in followup today for low-grade diverticular bleeding. She was seen in the office on 11/22/2011 and for details please see that note. She had been on chronically anticoagulated with Coumadin and had also been taking aspirin. She was felt to have recurrent diverticular bleeding. Her hemoglobin was 13 on Wednesday, 11/22/2011, she felt fine and did not want to be hospitalized. In the interim we have held her Coumadin and repeated the hemoglobin yesterday which was 12.1. She reports that she had 2 bowel movements yesterday both of which were dark maroonish in color. She says she feels a bit tired but otherwise is fine. She denies any nausea, she did have some indigestion yesterday which is resolved. She denies any abdominal pain or cramping. She had 1 small dark maroonish stool today.  Hemoglobin this morning repeated and is 12.2.    Review of Systems no change from note dated 11/22/2011     Objective:   Physical Exam Well-developed elderly white female in no acute distress, color good. Cardiovascular; regular rate and rhythm with S1-S2 no murmur or gallop Pulmonary; clear bilaterally, Abdomen; soft nontender bowel sounds active, Recta;l not repeated       Assessment & Plan:  #31 77 year old female with low-grade recurrent diverticular bleed, she is very stable and has not had any lab evidence of ongoing bleeding over the past 24 hours. Patient to continue to rest of this weekend, she knows to call should she have any increase in her bleeding and/or to come on to the emergency room. Patient will continue to hold her Coumadin. At this point would anticipate leaving her off Coumadin for a total of 2 weeks and then resuming grade She sees Dr. Tresa Endo for cardiology- have also asked her to make a followup appointment with [facility] to discuss  ongoing Coumadin use given her recurrent bleeding. Patient will have followup hemoglobin and pro time on Monday, 11/27/2011

## 2011-11-27 ENCOUNTER — Telehealth: Payer: Self-pay | Admitting: *Deleted

## 2011-11-27 ENCOUNTER — Encounter: Payer: Self-pay | Admitting: Physician Assistant

## 2011-11-27 ENCOUNTER — Encounter (HOSPITAL_COMMUNITY): Payer: Self-pay | Admitting: *Deleted

## 2011-11-27 ENCOUNTER — Other Ambulatory Visit (INDEPENDENT_AMBULATORY_CARE_PROVIDER_SITE_OTHER): Payer: Medicare Other

## 2011-11-27 ENCOUNTER — Inpatient Hospital Stay (HOSPITAL_COMMUNITY)
Admission: AD | Admit: 2011-11-27 | Discharge: 2011-12-07 | DRG: 378 | Disposition: A | Discharge status: Home / Self Care | Payer: Medicare Other | Source: Ambulatory Visit | Attending: Gastroenterology | Admitting: Gastroenterology

## 2011-11-27 ENCOUNTER — Telehealth: Payer: Self-pay | Admitting: Internal Medicine

## 2011-11-27 ENCOUNTER — Ambulatory Visit (HOSPITAL_COMMUNITY): Payer: Medicare Other | Admitting: Physician Assistant

## 2011-11-27 VITALS — BP 118/64 | HR 72 | Ht 64.0 in | Wt 183.6 lb

## 2011-11-27 DIAGNOSIS — R141 Gas pain: Secondary | ICD-10-CM

## 2011-11-27 DIAGNOSIS — E876 Hypokalemia: Secondary | ICD-10-CM | POA: Diagnosis present

## 2011-11-27 DIAGNOSIS — K922 Gastrointestinal hemorrhage, unspecified: Secondary | ICD-10-CM

## 2011-11-27 DIAGNOSIS — M8448XA Pathological fracture, other site, initial encounter for fracture: Secondary | ICD-10-CM | POA: Diagnosis present

## 2011-11-27 DIAGNOSIS — E538 Deficiency of other specified B group vitamins: Secondary | ICD-10-CM

## 2011-11-27 DIAGNOSIS — M549 Dorsalgia, unspecified: Secondary | ICD-10-CM | POA: Diagnosis present

## 2011-11-27 DIAGNOSIS — A498 Other bacterial infections of unspecified site: Secondary | ICD-10-CM | POA: Diagnosis present

## 2011-11-27 DIAGNOSIS — D126 Benign neoplasm of colon, unspecified: Secondary | ICD-10-CM | POA: Diagnosis present

## 2011-11-27 DIAGNOSIS — N390 Urinary tract infection, site not specified: Secondary | ICD-10-CM | POA: Diagnosis present

## 2011-11-27 DIAGNOSIS — Z7901 Long term (current) use of anticoagulants: Secondary | ICD-10-CM

## 2011-11-27 DIAGNOSIS — R142 Eructation: Secondary | ICD-10-CM

## 2011-11-27 DIAGNOSIS — R110 Nausea: Secondary | ICD-10-CM

## 2011-11-27 DIAGNOSIS — R195 Other fecal abnormalities: Secondary | ICD-10-CM

## 2011-11-27 DIAGNOSIS — R109 Unspecified abdominal pain: Secondary | ICD-10-CM | POA: Diagnosis not present

## 2011-11-27 DIAGNOSIS — R143 Flatulence: Secondary | ICD-10-CM | POA: Diagnosis present

## 2011-11-27 DIAGNOSIS — D62 Acute posthemorrhagic anemia: Secondary | ICD-10-CM | POA: Diagnosis present

## 2011-11-27 DIAGNOSIS — I4891 Unspecified atrial fibrillation: Secondary | ICD-10-CM | POA: Diagnosis present

## 2011-11-27 LAB — COMPREHENSIVE METABOLIC PANEL
AST: 12 U/L (ref 0–37)
Alkaline Phosphatase: 63 U/L (ref 39–117)
BUN: 24 mg/dL — ABNORMAL HIGH (ref 6–23)
CO2: 25 mEq/L (ref 19–32)
Chloride: 103 mEq/L (ref 96–112)
Creatinine, Ser: 1.02 mg/dL (ref 0.50–1.10)
GFR calc non Af Amer: 48 mL/min — ABNORMAL LOW (ref >90)
Potassium: 3.3 mEq/L — ABNORMAL LOW (ref 3.5–5.1)
Total Bilirubin: 0.5 mg/dL (ref 0.3–1.2)

## 2011-11-27 LAB — CBC
HCT: 36.2 % (ref 36.0–46.0)
Hemoglobin: 12.1 g/dL (ref 12.0–15.0)
MCV: 93.8 fl (ref 78.0–100.0)
Platelets: 244 10*3/uL (ref 150.0–400.0)
RBC: 3.86 Mil/uL — ABNORMAL LOW (ref 3.87–5.11)

## 2011-11-27 MED ORDER — NEBIVOLOL HCL 5 MG PO TABS
5.0000 mg | ORAL_TABLET | Freq: Every day | ORAL | Status: DC
  Administered 2011-11-28: 5 mg via ORAL
  Filled 2011-11-27 (×2): qty 1

## 2011-11-27 MED ORDER — ACETAMINOPHEN 325 MG PO TABS: 650.0000 mg | ORAL_TABLET | Freq: Four times a day (QID) | ORAL | Status: DC | PRN

## 2011-11-27 MED ORDER — ONDANSETRON HCL 4 MG/2ML IJ SOLN
4.0000 mg | Freq: Four times a day (QID) | INTRAMUSCULAR | Status: DC | PRN
  Administered 2011-12-04: 4 mg via INTRAVENOUS
  Filled 2011-11-27: qty 2

## 2011-11-27 MED ORDER — OLMESARTAN 10 MG HALF TABLET
10.0000 mg | ORAL_TABLET | Freq: Every day | ORAL | Status: DC
  Administered 2011-11-28: 10 mg via ORAL
  Filled 2011-11-27 (×4): qty 1

## 2011-11-27 MED ORDER — GI COCKTAIL ~~LOC~~
30.0000 mL | Freq: Three times a day (TID) | ORAL | Status: DC | PRN
  Administered 2011-11-28: 30 mL via ORAL
  Filled 2011-11-27 (×2): qty 30

## 2011-11-27 MED ORDER — KCL IN DEXTROSE-NACL 20-5-0.45 MEQ/L-%-% IV SOLN
INTRAVENOUS | Status: DC
  Administered 2011-11-27 – 2011-12-04 (×12): via INTRAVENOUS
  Filled 2011-11-27 (×18): qty 1000

## 2011-11-27 MED ORDER — PANTOPRAZOLE SODIUM 40 MG IV SOLR
40.0000 mg | Freq: Two times a day (BID) | INTRAVENOUS | Status: DC
  Administered 2011-11-27 – 2011-12-04 (×14): 40 mg via INTRAVENOUS
  Filled 2011-11-27 (×15): qty 40

## 2011-11-27 MED ORDER — ACETAMINOPHEN 650 MG RE SUPP: 650.0000 mg | Freq: Four times a day (QID) | RECTAL | Status: DC | PRN

## 2011-11-27 MED ORDER — HYDROMORPHONE HCL PF 1 MG/ML IJ SOLN
0.5000 mg | INTRAMUSCULAR | Status: DC | PRN
  Administered 2011-11-28 – 2011-12-02 (×8): 0.5 mg via INTRAVENOUS
  Filled 2011-11-27 (×11): qty 1

## 2011-11-27 MED ORDER — ONDANSETRON HCL 4 MG PO TABS: 4.0000 mg | ORAL_TABLET | Freq: Four times a day (QID) | ORAL | Status: DC | PRN

## 2011-11-27 MED ORDER — DIGOXIN 125 MCG PO TABS
0.1250 mg | ORAL_TABLET | Freq: Every day | ORAL | Status: DC
  Administered 2011-11-28 – 2011-12-07 (×8): 0.125 mg via ORAL
  Filled 2011-11-27 (×11): qty 1

## 2011-11-27 NOTE — Telephone Encounter (Signed)
Amy Esterwood PA told me to call the patient and she can go to the admitting department of [facility].  They do have a room ready for the patient. The patient told me she would go to the hospital for admission.

## 2011-11-27 NOTE — Progress Notes (Addendum)
  Subjective:    Patient ID: [name], female    DOB: Mar 22, 1922, 77 y.o.   MRN: [id number]  HPI    Review of Systems     Objective:   Physical Exam        Assessment & Plan:  See Admission note- pt admitted to [facility] long today from office.  I reviewed the admission H&P and agree with the assessment and plan. Remainder of the plan per inpatient consultation

## 2011-11-27 NOTE — Telephone Encounter (Signed)
Patient walked into the office.  C/O belching and chest pain that is uncontrollable at times.  She says sitting very still in the recliner makes it better.  She reports that she is has gried gas-x and this makes her worse.  Discussed with Mike Gip PA she said they discussed this some last week when she was seen and Mike Gip PA wants to see her today.  I have set her up to see her now.

## 2011-11-27 NOTE — H&P (Signed)
Primary Care Physician:  [name], MD Primary Gastroenterologist:  Dr.Gessner  CHIEF COMPLAINT: Gas, intractable belching mid back pain and nausea  HPI: [name] is a 77 y.o. female who is known to Dr. Leone Payor with history of chronic atrial fibrillation, generally maintained on Coumadin. She was hospitalized in June of 2012 with a GI bleed. At that time she underwent upper endoscopy which was normal and then colonoscopy which was pertinent only for mild diverticulosis in the sigmoid colon. She also underwent a capsule endoscopy which was negative. She had not had any recurrent bleeding since and had resumed her Coumadin a couple of weeks later. She was seen last week in the office twice after having recurrent low-grade bleeding with dark stools with pinkish blood noted in the commode onset last Tuesday. Complained of feeling a bit fatigued but had no other symptoms other than some vague gassiness. She had not been on any aspirin or NSAIDs. Her Coumadin was held when she was seen in the office on 11/22/2011. That time her hemoglobin was 13 and her INR was 2.9. She did not want to be hospitalized. We follow her hemoglobin stent daily which remained very stable in the 12 range. She was seen again on Friday 12 7 still with very dark maroonish stool which is 1-2 bowel movements per day and her hemoglobin was stable at 12.1. She did complain of a "gas" attack which had subsided when she was seen in the office. She was asked to come back today for repeat CBC. She walked up to the third floor because she says she's been having horrible gas all weekend. She has vague nausea but has not vomited. Has had no fever or chills. She continues to have very dark stools with some reddish tinge. She denies any real abdominal pain but says she has been having "wave-like" pain in her mid back which did radiate around into her right upper quadrant yesterday. She feels that her gas and belching it is aggravated by  movement and by eating. She is has no heartburn or dysphagia or odynophagia. She is tired and says she hasn't slept all well all weekend and has been belching repeatedly.  CBC today shows; WBC of 5.7, hemoglobin 12.1 which is stable and INR is down to 1.2. She did have a CT scan of the abdomen and pelvis about 2 years ago which showed cholelithiasis.   Past Medical History  Diagnosis Date  . Abnormal liver function test   . Osteoarthritis     back and lower extremities, neck  . Atrial fibrillation   . IBS (irritable bowel syndrome)   . Pancreatic insufficiency     ? mass on imaging notborne out on follow-up  . Gastric ulcer   . Erosive gastritis 7/10  . Vitamin B12 deficiency   . Iron deficiency anemia   . Compression fracture of C-spine     Past Surgical History  Procedure Date  . Tubal ligation   . Back surgery   . Left knee surgery   . Hernia repair   . Carpal tunnel release     right  . Wrist surgery     right plate  . Bladder surgery     tact  . Ovarian cyst removal   . Esophagogastroduodenoscopy 06/2009    erosive gastritis, small ulcer, hiatal hernia (negative biopsies)  . Colonoscopy 08/2009    diverticulosis, internal hemorrhoids  . Upper gastrointestinal endoscopy 05/30/11    normal  . Colonoscopy 06/02/11  Diverticulosis  . Capsule endoscopy 06/04/11    normal    Prior to Admission medications   Medication Sig Start Date End Date Taking? Authorizing Provider  Calcium 1200-1000 MG-UNIT CHEW Take 1 by mouth once daily    Yes Historical Provider, MD  Cholecalciferol (VITAMIN D3) 400 UNITS CAPS Take 1 capsule by mouth daily.     Yes Historical Provider, MD  cycloSPORINE (RESTASIS) 0.05 % ophthalmic emulsion Place 1 drop into both eyes 2 (two) times daily.     Yes Historical Provider, MD  digoxin (LANOXIN) 0.125 MG tablet Take 125 mcg by mouth daily.     Yes Historical Provider, MD  ferrous sulfate 325 (65 FE) MG tablet Take 1 tablet (325 mg total) by mouth  daily. 07/05/11 07/04/12 Yes Iva Boop, MD  furosemide (LASIX) 20 MG tablet Take 20 mg by mouth daily.     Yes Historical Provider, MD  lansoprazole (PREVACID) 30 MG capsule TAKE 1 TABLET 30 MINUTES BEFORE BREAKFAST 09/10/11  Yes Iva Boop, MD  Lutein 20 MG TABS Take 1 tablet by mouth daily.     Yes Historical Provider, MD  nebivolol (BYSTOLIC) 5 MG tablet Take 2.5 mg by mouth daily.     Yes Historical Provider, MD  Pancrelipase, Lip-Prot-Amyl, (CREON) 24000 UNITS CPEP Take by mouth. Take 1 tablet by mouth three times a day ac meals and snacks    Yes Historical Provider, MD  valsartan-hydrochlorothiazide (DIOVAN-HCT) 80-12.5 MG per tablet Take 1 tablet by mouth daily.     Yes Historical Provider, MD    Current Outpatient Prescriptions  Medication Sig Dispense Refill  . Calcium 1200-1000 MG-UNIT CHEW Take 1 by mouth once daily       . Cholecalciferol (VITAMIN D3) 400 UNITS CAPS Take 1 capsule by mouth daily.        . cycloSPORINE (RESTASIS) 0.05 % ophthalmic emulsion Place 1 drop into both eyes 2 (two) times daily.        . digoxin (LANOXIN) 0.125 MG tablet Take 125 mcg by mouth daily.        . ferrous sulfate 325 (65 FE) MG tablet Take 1 tablet (325 mg total) by mouth daily.  30 tablet  3  . furosemide (LASIX) 20 MG tablet Take 20 mg by mouth daily.        . lansoprazole (PREVACID) 30 MG capsule TAKE 1 TABLET 30 MINUTES BEFORE BREAKFAST  30 capsule  11  . Lutein 20 MG TABS Take 1 tablet by mouth daily.        . nebivolol (BYSTOLIC) 5 MG tablet Take 2.5 mg by mouth daily.        . Pancrelipase, Lip-Prot-Amyl, (CREON) 24000 UNITS CPEP Take by mouth. Take 1 tablet by mouth three times a day ac meals and snacks       . valsartan-hydrochlorothiazide (DIOVAN-HCT) 80-12.5 MG per tablet Take 1 tablet by mouth daily.         Current Facility-Administered Medications  Medication Dose Route Frequency Provider Last Rate Last Dose  . cyanocobalamin ((VITAMIN B-12)) injection 1,000 mcg  1,000 mcg  Intramuscular Q90 days Iva Boop, MD   1,000 mcg at 09/20/11 1459    Allergies as of 11/27/2011 - Review Complete 11/27/2011  Allergen Reaction Noted  . Meperidine hcl  06/04/2007  . Morphine sulfate  06/04/2007    Family History  Problem Relation Age of Onset  . Heart disease Mother     History   Social History  .  Marital Status: Widowed    Spouse Name: N/A    Number of Children: 3  . Years of Education: N/A   Occupational History  . Retired    Social History Main Topics  . Smoking status: Never Smoker   . Smokeless tobacco: Never Used  . Alcohol Use: Yes     glass of wine occasionaly  . Drug Use: No  . Sexually Active: Not on file   Other Topics Concern  . Not on file   Social History Narrative   Lives alone, still active and drives, works at sons Human resources officer    Review of Systems: Gen: Denies any fever, chills, sweats, anorexia, fatigue, weakness, malaise, weight loss, and sleep disorder Pertinent positive and negative review of systems were noted in the above HPI section.  All other review of systems was otherwise negative. Marland Kitchen  Physical Exam: Vital signs in last 24 hours: @VSRANGES @   General:   Alert,  Well-developed, well-nourished, elderly, uncomfortable appearing pleasant and cooperative in NAD Head:  Normocephalic and atraumatic. Eyes:  Sclera clear, no icterus.   Conjunctiva pink. Mouth:  No defo,  or lesions.rmity or lesions.  Oropharynx pink & moist. Neck:  Supple; no masses or thyromegaly. Lungs:  Clear throughout to auscultation.   No wheezes, crackles, or rhonchi. No acute distress. Heart:  Regular rate and rhythm; no murmurs, clicks, rubs,  or gallops. Abdomen:  Sof tminimally tender epigastrium, no guarding No masses, hepatosplenomegaly or hernias noted. Normal bowel sounds Rectal:  Not repeated  Msk:  Symmetrical without gross deformities. Normal posture. Pulses:  Normal pulses noted. Extremities:  Without clubbing or  edema. Neurologic:  Alert and  oriented x4;  grossly normal neurologically. Skin:  Intact without significant lesions or rashes. . Psych:  Alert and cooperative. Normal mood and affect.  Intake/Output from previous day:   Intake/Output this shift: @IOTHISSHIFT @  Lab Results:  Basename 11/27/11 1340  WBC 5.7  HGB 12.1  HCT 36.2  PLT 244.0         Basename 11/27/11 1340  LABPROT 13.3*  INR 1.2*     Impression / Plan:  #23 77 year old white female who presented with low-grade presumed lower GI bleeding about 5 days ago, felt consistent with low-grade diverticular bleed exacerbated by anticoagulating. Hemoglobin has remained very stable and INR is now 1.2.  #2 Similar presentation in June 2012 with low-grade GI bleeding felt diverticular in origin. At that time with negative EGD and capsule endoscopy. Colonoscopy showing sigmoid diverticulosis. #3  four-day history of progressive and now intractable gas belching and intermittent wavelike back pain with nausea. Rule out biliary colic, cholecystitis, pancreatitis. #4 History of atrial fibrillation,  Coumadin currently on hold #5 History of IBS #6 history of pancreatic insufficiency  Plan; Patient will be admitted to [place] [facility] hospital this afternoon, for further diagnostic evaluation, Will obtain baseline labs including hepatic panel amylase and lipase Schedule for upper abdominal ultrasound this evening Further imaging pending findings GI cocktail when necessary Twice daily Protonix When necessary anti-emetics. For further details please see the orders     @RRHLOS @  Fantasy Donald  11/27/2011, 3:12 PM   Allergies  Allergen Reactions  . Meperidine Hcl     REACTION: nausea: VOMIT  . Morphine Sulfate     REACTION: nausea: "VOMIT"   Outpatient Prescriptions Prior to Visit  Medication Sig Dispense Refill  . Calcium 1200-1000 MG-UNIT CHEW Take 1 by mouth once daily       . Cholecalciferol (VITAMIN D3)  400 UNITS  CAPS Take 1 capsule by mouth daily.        . cycloSPORINE (RESTASIS) 0.05 % ophthalmic emulsion Place 1 drop into both eyes 2 (two) times daily.        . digoxin (LANOXIN) 0.125 MG tablet Take 125 mcg by mouth daily.        . ferrous sulfate 325 (65 FE) MG tablet Take 1 tablet (325 mg total) by mouth daily.  30 tablet  3  . furosemide (LASIX) 20 MG tablet Take 20 mg by mouth daily.        . lansoprazole (PREVACID) 30 MG capsule TAKE 1 TABLET 30 MINUTES BEFORE BREAKFAST  30 capsule  11  . Lutein 20 MG TABS Take 1 tablet by mouth daily.        . nebivolol (BYSTOLIC) 5 MG tablet Take 2.5 mg by mouth daily.        . Pancrelipase, Lip-Prot-Amyl, (CREON) 24000 UNITS CPEP Take by mouth. Take 1 tablet by mouth three times a day ac meals and snacks       . valsartan-hydrochlorothiazide (DIOVAN-HCT) 80-12.5 MG per tablet Take 1 tablet by mouth daily.         Facility-Administered Medications Prior to Visit  Medication Dose Route Frequency Provider Last Rate Last Dose  . cyanocobalamin ((VITAMIN B-12)) injection 1,000 mcg  1,000 mcg Intramuscular Q90 days Iva Boop, MD   1,000 mcg at 09/20/11 1459   Past Medical History  Diagnosis Date  . Abnormal liver function test   . Osteoarthritis     back and lower extremities, neck  . Atrial fibrillation   . IBS (irritable bowel syndrome)   . Pancreatic insufficiency     ? mass on imaging notborne out on follow-up  . Gastric ulcer   . Erosive gastritis 7/10  . Vitamin B12 deficiency   . Iron deficiency anemia   . Compression fracture of C-spine    Past Surgical History  Procedure Date  . Tubal ligation   . Back surgery   . Left knee surgery   . Hernia repair   . Carpal tunnel release     right  . Wrist surgery     right plate  . Bladder surgery     tact  . Ovarian cyst removal   . Esophagogastroduodenoscopy 06/2009    erosive gastritis, small ulcer, hiatal hernia (negative biopsies)  . Colonoscopy 08/2009    diverticulosis, internal  hemorrhoids  . Upper gastrointestinal endoscopy 05/30/11    normal  . Colonoscopy 06/02/11    Diverticulosis  . Capsule endoscopy 06/04/11    normal   History   Social History  . Marital Status: Widowed    Spouse Name: N/A    Number of Children: 3  . Years of Education: N/A   Occupational History  . Retired    Social History Main Topics  . Smoking status: Never Smoker   . Smokeless tobacco: Never Used  . Alcohol Use: Yes     glass of wine occasionaly  . Drug Use: No  . Sexually Active: None   Other Topics Concern  . None   Social History Narrative   Lives alone, still active and drives, works at sons Human resources officer   Family History  Problem Relation Age of Onset  . Heart disease Mother

## 2011-11-28 ENCOUNTER — Inpatient Hospital Stay (HOSPITAL_COMMUNITY): Payer: Medicare Other

## 2011-11-28 DIAGNOSIS — K921 Melena: Secondary | ICD-10-CM

## 2011-11-28 DIAGNOSIS — R1013 Epigastric pain: Secondary | ICD-10-CM

## 2011-11-28 MED ORDER — NEBIVOLOL HCL 2.5 MG PO TABS
2.5000 mg | ORAL_TABLET | Freq: Every day | ORAL | Status: DC
  Filled 2011-11-28 (×3): qty 1

## 2011-11-28 MED ORDER — IOHEXOL 300 MG/ML  SOLN
100.0000 mL | Freq: Once | INTRAMUSCULAR | Status: AC | PRN
  Administered 2011-11-28: 100 mL via INTRAVENOUS

## 2011-11-28 NOTE — Progress Notes (Signed)
Patient ID: EURA MCCAUSLIN, female   DOB: 1922-09-29, 77 y.o.   MRN: [id number] [facility] Progress Note  Subjective: Luane did well last night and then had another bad "gas" attack this morning with pain in her right upper quadrant and into her mid back, these are associated with some nausea lots of belching no vomiting. She has not had any further bloody bowel movements. Abdominal ultrasound was done this morning and is pending.  Objective:  Vital signs in last 24 hours: Temp:  [97.8 F (36.6 C)-98 F (36.7 C)] 98 F (36.7 C) (12/11 7829) Pulse Rate:  [50-72] 58  (12/11 5621) Resp:  [18-20] 20  (12/11 3086) BP: (97-118)/(50-64) 97/64 mmHg (12/11 0608) SpO2:  [95 %-97 %] 96 % (12/11 5784) Weight:  [82.7 kg (182 lb 5.1 oz)-83.28 kg (183 lb 9.6 oz)] 182 lb 5.1 oz (82.7 kg) (12/10 1840)   General:   Alert,  Well-developed, elderly  white female in NAD Heart:  Regular rate and rhythm; no murmurs Abdomen:  Soft, mildly tender in the epigastrium and right upper quadrant. Normal bowel sounds, without guarding, and without rebound.   Extremities:  Without edema. Neurologic:  Alert and  oriented x4;  grossly normal neurologically. Psych:  Alert and cooperative. Normal mood and affect.  Intake/Output from previous day:   Intake/Output this shift:    Lab Results:  Basename 11/27/11 1340  WBC 5.7  HGB 12.1  HCT 36.2  PLT 244.0   BMET  Basename 11/27/11 1927  NA 139  K 3.3*  CL 103  CO2 25  GLUCOSE 111*  BUN 24*  CREATININE 1.02  CALCIUM 9.6   LFT  Basename 11/27/11 1927  PROT 6.3  ALBUMIN 3.7  AST 12  ALT 10  ALKPHOS 63  BILITOT 0.5  BILIDIR --  IBILI --   PT/INR  Basename 11/27/11 1340  LABPROT 13.3*  INR 1.2*   Hepatitis Panel  Assessment / Plan: #86 77 year old female with onset of darker reddish stools approximately one week ago, with low-grade ongoing bleeding and stable hemoglobin in setting of chronic anticoagulation with Coumadin., Her  Coumadin has been held her INR is now normal. Patient had onset of severe gas pains, belching and mid back pain with intermittent nausea about 4 days ago. These symptoms have persisted and are worse with any by mouth intake. Etiology of her symptoms is not clear, and hesitant to give her two different separate diagnoses and wonder if her bleeding is coming from the same process as her mid back pain. Question hemobilia?? Await upper abdominal ultrasound results this morning, if this is nondiagnostic she will probably need upper endoscopy and CT scan of the abdomen and pelvis. Repeat CBC today Continue off Coumadin #2 mild hypokalemia, replacing IV #3 right atrial fibrillation #4 history of acute GI bleed June 2012 workup negative with the exception of sigmoid diverticulosis., This included capsule endoscopy. Active Problems:  * No active hospital problems. *      LOS: 1 day   Amy Esterwood  11/28/2011, 9:51 AM  Ulrasound and CT are unremarkable.  Plan EGD in am in view of limited GI bleeding. Will check u/a.

## 2011-11-28 NOTE — Progress Notes (Signed)
Patient ID: [name], female   DOB: 07/18/1922, 77 y.o.   MRN: [id number]  US shows no GB disease-Pancreas and Aorta not well seen - will get CT abd /pelvis now.

## 2011-11-29 ENCOUNTER — Encounter (HOSPITAL_COMMUNITY)
Admission: AD | Disposition: A | Discharge status: Home / Self Care | Payer: Self-pay | Source: Ambulatory Visit | Attending: Gastroenterology

## 2011-11-29 ENCOUNTER — Encounter (HOSPITAL_COMMUNITY): Payer: Self-pay | Admitting: *Deleted

## 2011-11-29 DIAGNOSIS — K921 Melena: Secondary | ICD-10-CM

## 2011-11-29 DIAGNOSIS — R1013 Epigastric pain: Secondary | ICD-10-CM

## 2011-11-29 HISTORY — PX: GIVENS CAPSULE STUDY: SHX5432

## 2011-11-29 HISTORY — PX: ESOPHAGOGASTRODUODENOSCOPY: SHX5428

## 2011-11-29 LAB — BASIC METABOLIC PANEL
BUN: 17 mg/dL (ref 6–23)
Calcium: 8.5 mg/dL (ref 8.4–10.5)
Chloride: 108 mEq/L (ref 96–112)
Creatinine, Ser: 0.92 mg/dL (ref 0.50–1.10)
GFR calc Af Amer: 63 mL/min — ABNORMAL LOW (ref >90)
GFR calc non Af Amer: 54 mL/min — ABNORMAL LOW (ref >90)

## 2011-11-29 LAB — URINALYSIS, ROUTINE W REFLEX MICROSCOPIC
Glucose, UA: NEGATIVE mg/dL
Ketones, ur: NEGATIVE mg/dL
Protein, ur: NEGATIVE mg/dL

## 2011-11-29 LAB — CBC
HCT: 27.6 % — ABNORMAL LOW (ref 36.0–46.0)
MCHC: 32.2 g/dL (ref 30.0–36.0)
MCV: 93.6 fL (ref 78.0–100.0)
Platelets: 182 10*3/uL (ref 150–400)
RDW: 14.9 % (ref 11.5–15.5)
WBC: 2.6 10*3/uL — ABNORMAL LOW (ref 4.0–10.5)

## 2011-11-29 SURGERY — EGD (ESOPHAGOGASTRODUODENOSCOPY)
Anesthesia: Moderate Sedation

## 2011-11-29 SURGERY — IMAGING PROCEDURE, GI TRACT, INTRALUMINAL, VIA CAPSULE

## 2011-11-29 MED ORDER — ACETAMINOPHEN 325 MG PO TABS
650.0000 mg | ORAL_TABLET | Freq: Once | ORAL | Status: AC
  Administered 2011-11-29: 650 mg via ORAL
  Filled 2011-11-29: qty 2

## 2011-11-29 MED ORDER — GLYCOPYRROLATE 0.2 MG/ML IJ SOLN
INTRAMUSCULAR | Status: DC | PRN
  Administered 2011-11-29: 0.2 mg via INTRAVENOUS

## 2011-11-29 MED ORDER — NEBIVOLOL HCL 2.5 MG PO TABS: 2.5000 mg | ORAL_TABLET | Freq: Every day | ORAL | Status: DC

## 2011-11-29 MED ORDER — PANTOPRAZOLE SODIUM 40 MG PO TBEC: 40.0000 mg | DELAYED_RELEASE_TABLET | Freq: Every day | ORAL | Status: DC

## 2011-11-29 MED ORDER — MIDAZOLAM HCL 5 MG/5ML IJ SOLN
INTRAMUSCULAR | Status: DC | PRN
  Administered 2011-11-29 (×3): 1 mg via INTRAVENOUS
  Administered 2011-11-29: 2 mg via INTRAVENOUS

## 2011-11-29 MED ORDER — CYCLOSPORINE 0.05 % OP EMUL
1.0000 [drp] | Freq: Two times a day (BID) | OPHTHALMIC | Status: DC
  Administered 2011-11-30 – 2011-12-03 (×7): 1 [drp] via OPHTHALMIC
  Filled 2011-11-29 (×11): qty 1

## 2011-11-29 MED ORDER — DIGOXIN 125 MCG PO TABS: 125.0000 ug | ORAL_TABLET | Freq: Every day | ORAL | Status: DC

## 2011-11-29 MED ORDER — FENTANYL NICU IV SYRINGE 50 MCG/ML
INJECTION | INTRAMUSCULAR | Status: DC | PRN
  Administered 2011-11-29 (×3): 25 ug via INTRAVENOUS

## 2011-11-29 MED ORDER — BUTAMBEN-TETRACAINE-BENZOCAINE 2-2-14 % EX AERO
INHALATION_SPRAY | CUTANEOUS | Status: DC | PRN
  Administered 2011-11-29: 2 via TOPICAL

## 2011-11-29 NOTE — H&P (View-Only) (Signed)
Patient ID: [name], female   DOB: 06/25/1922, 77 y.o.   MRN: [id number]  US shows no GB disease-Pancreas and Aorta not well seen - will get CT abd /pelvis now. 

## 2011-11-29 NOTE — Op Note (Signed)
[facility] [address] [place], [place]  [address]  ENDOSCOPY PROCEDURE REPORT  PATIENT:  [name], [name]  MR#:  [id number] BIRTHDATE:  02-06-1922, 89 yrs. old  GENDER:  female  ENDOSCOPIST:  Barbette Hair. Arlyce Dice, MD Referred by:  Butch Penny, M.D.  PROCEDURE DATE:  11/29/2011 PROCEDURE:  EGD, diagnostic 43235 ASA CLASS:  Class II INDICATIONS:  melena  MEDICATIONS:   These medications were titrated to patient response per physician's verbal order, Fentanyl 75 mcg IV, Versed 5 mg IV, glycopyrrolate (Robinal) 0.2 mg IV TOPICAL ANESTHETIC:  Cetacaine Spray  DESCRIPTION OF PROCEDURE:   After the risks and benefits of the procedure were explained, informed consent was obtained.  The endoscope was introduced through the mouth and advanced to the third portion of the duodenum.  The instrument was slowly withdrawn as the mucosa was fully examined. <<PROCEDUREIMAGES>>  normal otherwise (see image2, image3, image4, image5, image7, image9, and image1).    Retroflexed views revealed no abnormalities.    The scope was then withdrawn from the patient and the procedure completed.  COMPLICATIONS:  None  ENDOSCOPIC IMPRESSION: 1) Normal RECOMMENDATIONS: 1) repeat capsule endoscopy  ______________________________ Barbette Hair. Arlyce Dice, MD  CC:  Stan Head, MD  n. Rosalie Doctor:   Barbette Hair. Willian Donson at 11/29/2011 10:24 AM  [place], [phone]

## 2011-11-29 NOTE — Progress Notes (Signed)
Upper endoscopy was entirely normal. Patient continues to have non-and now has had a drop in hemoglobin. Note leukopenia.  Plan repeat capsule endoscopy in the face of active GI bleeding. Repeat CBC in the a.m.

## 2011-11-29 NOTE — Progress Notes (Signed)
Patient had a large watery dark red stool on 12/11. Vitals remain stable and I called the MD on call and he increased her IV fluids.

## 2011-11-29 NOTE — Interval H&P Note (Signed)
History and Physical Interval Note:  11/29/2011 9:59 AM  [name]  has presented today for surgery, with the diagnosis of epigastric pain, nausea, gi bleeding  The various methods of treatment have been discussed with the patient and family. After consideration of risks, benefits and other options for treatment, the patient has consented to  Procedure(s): ESOPHAGOGASTRODUODENOSCOPY (EGD) as a surgical intervention .  The patients' history has been reviewed, patient examined, no change in status, stable for surgery.  I have reviewed the patients' chart and labs.  Questions were answered to the patient's satisfaction.     Melvia Heaps

## 2011-11-30 ENCOUNTER — Encounter (HOSPITAL_COMMUNITY): Payer: Self-pay

## 2011-11-30 ENCOUNTER — Inpatient Hospital Stay (HOSPITAL_COMMUNITY): Payer: Medicare Other

## 2011-11-30 ENCOUNTER — Other Ambulatory Visit: Payer: Self-pay

## 2011-11-30 ENCOUNTER — Encounter (HOSPITAL_COMMUNITY): Payer: Self-pay | Admitting: Gastroenterology

## 2011-11-30 DIAGNOSIS — K921 Melena: Secondary | ICD-10-CM

## 2011-11-30 DIAGNOSIS — R1013 Epigastric pain: Secondary | ICD-10-CM

## 2011-11-30 LAB — CBC
HCT: 31.1 % — ABNORMAL LOW (ref 36.0–46.0)
Hemoglobin: 10.2 g/dL — ABNORMAL LOW (ref 12.0–15.0)
MCV: 93.4 fL (ref 78.0–100.0)
RBC: 3.33 MIL/uL — ABNORMAL LOW (ref 3.87–5.11)
WBC: 3.1 10*3/uL — ABNORMAL LOW (ref 4.0–10.5)

## 2011-11-30 LAB — D-DIMER, QUANTITATIVE: D-Dimer, Quant: 0.52 ug/mL-FEU — ABNORMAL HIGH (ref 0.00–0.48)

## 2011-11-30 LAB — CARDIAC PANEL(CRET KIN+CKTOT+MB+TROPI): Relative Index: INVALID (ref 0.0–2.5)

## 2011-11-30 MED ORDER — NEBIVOLOL HCL 2.5 MG PO TABS
2.5000 mg | ORAL_TABLET | Freq: Every day | ORAL | Status: DC
  Administered 2011-12-01 – 2011-12-07 (×6): 2.5 mg via ORAL
  Filled 2011-11-30 (×7): qty 1

## 2011-11-30 MED ORDER — IOHEXOL 300 MG/ML  SOLN
100.0000 mL | Freq: Once | INTRAMUSCULAR | Status: AC | PRN
  Administered 2011-11-30: 100 mL via INTRAVENOUS

## 2011-11-30 NOTE — Progress Notes (Signed)
Subjective: *The patient had 2 episodes of severe right flank pain that radiated across her lower back similar to what she previously described. This was accompanied by excessive belching. She also continues to small amounts of reddish stool.*Capsule endoscopy was done yesterday.  Objective: Vital signs in last 24 hours: Temp:  [97.4 F (36.3 C)-98.2 F (36.8 C)] 97.8 F (36.6 C) (12/13 0649) Pulse Rate:  [49-98] 68  (12/13 0649) Resp:  [13-49] 18  (12/13 0649) BP: (75-122)/(40-61) 100/56 mmHg (12/13 0649) SpO2:  [93 %-100 %] 96 % (12/12 2200) Last BM Date: 11/29/11 General:   Alert,  Well-developed, well-nourished, pleasant and cooperative in NAD Head:  Normocephalic and atraumatic. Eyes:  Sclera clear, no icterus.   Conjunctiva pink. Mouth:  No deformity or lesions, dentition normal. Neck:  Supple; no masses or thyromegaly. Heart:  Regular rate and rhythm; no murmurs, clicks, rubs,  or gallops. Abdomen:  Soft, nontender and nondistended. No masses, hepatosplenomegaly or hernias noted. Normal bowel sounds, without guarding, and without rebound. There is no CVA tenderness  Msk:  Symmetrical without gross deformities. Normal posture. Pulses:  Normal pulses noted. Extremities:  Without clubbing or edema. Neurologic:  Alert and  oriented x4;  grossly normal neurologically. Skin:  Intact without significant lesions or rashes. Cervical Nodes:  No significant cervical adenopathy. Psych:  Alert and cooperative. Normal mood and affect.  Intake/Output from previous day: 12/12 0701 - 12/13 0700 In: 2855.4 [P.O.:120; I.V.:2735.4] Out: -  Intake/Output this shift:    Lab Results:  Basename 11/30/11 0505 11/29/11 0447 11/27/11 1340  WBC 3.1* 2.6* 5.7  HGB 10.2* 8.9* 12.1  HCT 31.1* 27.6* 36.2  PLT 179 182 244.0   BMET  Basename 11/29/11 0447 11/27/11 1927  NA 139 139  K 3.9 3.3*  CL 108 103  CO2 24 25  GLUCOSE 112* 111*  BUN 17 24*  CREATININE 0.92 1.02  CALCIUM 8.5 9.6    LFT  Basename 11/27/11 1927  PROT 6.3  ALBUMIN 3.7  AST 12  ALT 10  ALKPHOS 63  BILITOT 0.5  BILIDIR --  IBILI --   PT/INR  Basename 11/27/11 1340  LABPROT 13.3*  INR 1.2*   Hepatitis Panel No results found for this basename: HEPBSAG,HCVAB,HEPAIGM,HEPBIGM in the last 72 hours   Studies/Results: US Abdomen Complete  11/28/2011  *RADIOLOGY REPORT*  Clinical Data:  Nausea, back pain, and belching. Abnormal liver function tests.  ABDOMINAL ULTRASOUND COMPLETE  Comparison:  MRI abdomen 07/24/2007 and CT abdomen pelvis 06/06/2007  Findings:  Gallbladder:  No gallstones, gallbladder wall thickening, or pericholecystic fluid.  Common Bile Duct:  Within normal limits in caliber. Measures 3.8 mm.  Liver: No focal mass lesion identified.  Within normal limits in parenchymal echogenicity. A few small cysts are seen, the largest measuring 1.2 x 0.8 x 1.0 cm.  IVC:  Appears normal.  Pancreas:  Although the pancreas is difficult to visualize in its entirety, no focal pancreatic abnormality is identified. Marland Kitchen  Spleen:  Within normal limits in size and echotexture.  Right kidney:  Normal in size and parenchymal echogenicity. No evidence of mass or hydronephrosis.  Left kidney:  Normal in size and parenchymal echogenicity. A duplicated left renal collecting system, a normal variant is noted. No evidence of mass or hydronephrosis.  Abdominal Aorta:  The abdominal aorta is not adequately visualized, due to overlying bowel gas.  IMPRESSION:  1. No acute findings in the abdomen.  No definite explanation for elevated liver function tests. 2.  A few  small hepatic cysts, as seen on prior abdominal imaging.  Original Report Authenticated By: Britta Mccreedy, M.D.   Ct Abdomen Pelvis W Contrast  11/28/2011  *RADIOLOGY REPORT*  Clinical Data: Abdominal pain, mid back pain.  CT ABDOMEN AND PELVIS WITH CONTRAST  Technique:  Multidetector CT imaging of the abdomen and pelvis was performed following the standard  protocol during bolus administration of intravenous contrast.  Contrast: OMNIPAQUE IOHEXOL 300 MG/ML IV SOLN  Comparison: 06/06/2007.  Findings: Scarring in the lung bases.  Heart is mildly enlarged.  Scattered low density areas in the liver, stable since prior study compatible with cysts.  Gallbladder, spleen, pancreas, adrenals are unremarkable.  Kidneys enhance symmetrically.  No focal abnormality or hydronephrosis.  Large and small bowel are grossly unremarkable.  No free fluid, free air or adenopathy.  Uterus and adnexa as well as urinary bladder are grossly unremarkable.  Severe leftward scoliosis and degenerative changes.  Severe tortuosity in the aorta and iliac vessels.  No aneurysm.  No acute bony abnormality.  IMPRESSION: No acute findings in the abdomen or pelvis.  Original Report Authenticated By: Cyndie Chime, M.D.    Assessment: *Persistent episodes of mid back pain associated with belching along with passage of reddish stools. I am concerned that she may have a small bowel lesion that's bleeding and, perhaps, is causing intermittent obstructive symptoms.  Recommendations #1 review capsule study today, if possible #2 if capsule study is unrevealing proceed with CT enterographyMolly Maduro D. Arlyce Dice, MD, [facility] [phone]   Melvia Heaps  11/30/2011, 8:37 AM

## 2011-11-30 NOTE — Consult Note (Signed)
Reason for Consult: Patient's Request for BP control Referring Physician: GI  [name] is an 77 y.o. female.  HPI:  The patient is an 77 Yo caucasian female with a history of chronic AFIb for which she is on coumadin therapy.  She also has a history of LBBB, GIB(June 2012), nonischemic cardiomyopathy.  She wore a cardiac event monitor in July of this year which revealed a HR as low as 43 and as high as 110 BPM.  She usually maintained a heart between 60-90.  Her last 2D echo was in April 2012 and revealed an EF of 55%, mild asymmetric LVH, faint patent foramen ovale, moderate LA dilation.   She presented with anemia, melanic stools and hematochezia.  Workup has included EGD, capsule endoscopy, abdominal US.  EKG shows LBBB.  She complains of DOE, right flank pain, and some LEE.   She denies CP, palpitations, Orthopnea, PND.  She has received one unit PRBCs.  Past Medical History  Diagnosis Date  . Abnormal liver function test   . Osteoarthritis     back and lower extremities, neck  . Atrial fibrillation   . IBS (irritable bowel syndrome)   . Pancreatic insufficiency     ? mass on imaging notborne out on follow-up  . Gastric ulcer   . Erosive gastritis 7/10  . Vitamin B12 deficiency   . Iron deficiency anemia   . Compression fracture of C-spine     Past Surgical History  Procedure Date  . Tubal ligation   . Back surgery   . Left knee surgery   . Hernia repair   . Carpal tunnel release     right  . Wrist surgery     right plate  . Bladder surgery     tact  . Ovarian cyst removal   . Esophagogastroduodenoscopy 06/2009    erosive gastritis, small ulcer, hiatal hernia (negative biopsies)  . Colonoscopy 08/2009    diverticulosis, internal hemorrhoids  . Upper gastrointestinal endoscopy 05/30/11    normal  . Colonoscopy 06/02/11    Diverticulosis  . Capsule endoscopy 06/04/11    normal  . Esophagogastroduodenoscopy 11/29/2011    Procedure: ESOPHAGOGASTRODUODENOSCOPY (EGD);   Surgeon: Louis Meckel, MD;  Location: [facility];  Service: Endoscopy;  Laterality: N/A;  . Givens capsule study 11/29/2011    Procedure: GIVENS CAPSULE STUDY;  Surgeon: Louis Meckel, MD;  Location: [facility];  Service: Endoscopy;  Laterality: N/A;    Family History  Problem Relation Age of Onset  . Heart disease Mother   . Anesthesia problems Neg Hx   . Malignant hyperthermia Neg Hx     Social History:  reports that she has never smoked. She has never used smokeless tobacco. She reports that she drinks alcohol. She reports that she does not use illicit drugs.  Allergies:  Allergies  Allergen Reactions  . Meperidine Hcl     REACTION: nausea: VOMIT  . Morphine Sulfate     REACTION: nausea: "VOMIT"    Medications:   . cycloSPORINE  1 drop Both Eyes BID  . digoxin  0.125 mg Oral Daily  . nebivolol  2.5 mg Oral Daily  . pantoprazole (PROTONIX) IV  40 mg Intravenous Q12H  . DISCONTD: digoxin  125 mcg Oral Daily  . DISCONTD: nebivolol  2.5 mg Oral Daily  . DISCONTD: olmesartan  10 mg Oral Daily  . DISCONTD: pantoprazole  40 mg Oral Daily   Results for orders placed during the hospital encounter of  11/27/11 (from the past 48 hour(s))  CBC     Status: Abnormal   Collection Time   11/29/11  4:47 AM      Component Value Range Comment   WBC 2.6 (*) 4.0 - 10.5 (K/uL)    RBC 2.95 (*) 3.87 - 5.11 (MIL/uL)    Hemoglobin 8.9 (*) 12.0 - 15.0 (g/dL)    HCT 60.4 (*) 54.0 - 46.0 (%)    MCV 93.6  78.0 - 100.0 (fL)    MCH 30.2  26.0 - 34.0 (pg)    MCHC 32.2  30.0 - 36.0 (g/dL)    RDW 98.1  19.1 - 47.8 (%)    Platelets 182  150 - 400 (K/uL)   BASIC METABOLIC PANEL     Status: Abnormal   Collection Time   11/29/11  4:47 AM      Component Value Range Comment   Sodium 139  135 - 145 (mEq/L)    Potassium 3.9  3.5 - 5.1 (mEq/L)    Chloride 108  96 - 112 (mEq/L)    CO2 24  19 - 32 (mEq/L)    Glucose, Bld 112 (*) 70 - 99 (mg/dL)    BUN 17  6 - 23 (mg/dL)    Creatinine, Ser 2.95   0.50 - 1.10 (mg/dL)    Calcium 8.5  8.4 - 10.5 (mg/dL)    GFR calc non Af Amer 54 (*) >90 (mL/min)    GFR calc Af Amer 63 (*) >90 (mL/min)   URINALYSIS, ROUTINE W REFLEX MICROSCOPIC     Status: Abnormal   Collection Time   11/29/11  6:00 AM      Component Value Range Comment   Color, Urine YELLOW  YELLOW     APPearance CLEAR  CLEAR     Specific Gravity, Urine 1.017  1.005 - 1.030     pH 5.5  5.0 - 8.0     Glucose, UA NEGATIVE  NEGATIVE (mg/dL)    Hgb urine dipstick NEGATIVE  NEGATIVE     Bilirubin Urine NEGATIVE  NEGATIVE     Ketones, ur NEGATIVE  NEGATIVE (mg/dL)    Protein, ur NEGATIVE  NEGATIVE (mg/dL)    Urobilinogen, UA 0.2  0.0 - 1.0 (mg/dL)    Nitrite NEGATIVE  NEGATIVE     Leukocytes, UA MODERATE (*) NEGATIVE    URINE MICROSCOPIC-ADD ON     Status: Abnormal   Collection Time   11/29/11  6:00 AM      Component Value Range Comment   Squamous Epithelial / LPF RARE  RARE     WBC, UA 7-10  <3 (WBC/hpf) WITH CLUMPS   Bacteria, UA MANY (*) RARE    PREPARE RBC (CROSSMATCH)     Status: Normal   Collection Time   11/29/11 11:30 AM      Component Value Range Comment   Order Confirmation ORDER PROCESSED BY BLOOD BANK     TYPE AND SCREEN     Status: Normal   Collection Time   11/29/11 11:39 AM      Component Value Range Comment   ABO/RH(D) B POS      Antibody Screen NEG      Sample Expiration 12/02/2011      Unit Number [id number]      Blood Component Type RED CELLS,LR      Unit division 00      Status of Unit ISSUED,FINAL      Transfusion Status OK TO TRANSFUSE  Crossmatch Result Compatible     CBC     Status: Abnormal   Collection Time   11/30/11  5:05 AM      Component Value Range Comment   WBC 3.1 (*) 4.0 - 10.5 (K/uL)    RBC 3.33 (*) 3.87 - 5.11 (MIL/uL)    Hemoglobin 10.2 (*) 12.0 - 15.0 (g/dL)    HCT 16.1 (*) 09.6 - 46.0 (%)    MCV 93.4  78.0 - 100.0 (fL)    MCH 30.6  26.0 - 34.0 (pg)    MCHC 32.8  30.0 - 36.0 (g/dL)    RDW 04.5 (*) 40.9 - 15.5 (%)     Platelets 179  150 - 400 (K/uL)   D-DIMER, QUANTITATIVE     Status: Abnormal   Collection Time   11/30/11  1:55 PM      Component Value Range Comment   D-Dimer, Quant 0.52 (*) 0.00 - 0.48 (ug/mL-FEU)   CARDIAC PANEL(CRET KIN+CKTOT+MB+TROPI)     Status: Normal   Collection Time   11/30/11  1:55 PM      Component Value Range Comment   Total CK 68  7 - 177 (U/L)    CK, MB 2.4  0.3 - 4.0 (ng/mL)    Troponin I <0.30  <0.30 (ng/mL)    Relative Index RELATIVE INDEX IS INVALID  0.0 - 2.5     Procedures/Imaging  ABDOMINAL ULTRASOUND COMPLETE   Comparison:  MRI abdomen 07/24/2007 and CT abdomen pelvis 06/06/2007   Findings:   Gallbladder:  No gallstones, gallbladder wall thickening, or pericholecystic fluid.   Common Bile Duct:  Within normal limits in caliber. Measures 3.8 mm.   Liver: No focal mass lesion identified.  Within normal limits in parenchymal echogenicity. A few small cysts are seen, the largest measuring 1.2 x 0.8 x 1.0 cm.   IVC:  Appears normal.   Pancreas:  Although the pancreas is difficult to visualize in its entirety, no focal pancreatic abnormality is identified. Marland Kitchen   Spleen:  Within normal limits in size and echotexture.   Right kidney:  Normal in size and parenchymal echogenicity. No evidence of mass or hydronephrosis.   Left kidney:  Normal in size and parenchymal echogenicity. A duplicated left renal collecting system, a normal variant is noted. No evidence of mass or hydronephrosis.   Abdominal Aorta:  The abdominal aorta is not adequately visualized, due to overlying bowel gas.   IMPRESSION:   1. No acute findings in the abdomen.  No definite explanation for elevated liver function tests. 2.  A few small hepatic cysts, as seen on prior abdominal imaging.  CT ABDOMEN AND PELVIS WITH CONTRAST   Technique:  Multidetector CT imaging of the abdomen and pelvis was performed following the standard protocol during bolus administration of  intravenous contrast.   Contrast: OMNIPAQUE IOHEXOL 300 MG/ML IV SOLN   Comparison: 06/06/2007.   Findings: Scarring in the lung bases.  Heart is mildly enlarged.   Scattered low density areas in the liver, stable since prior study compatible with cysts.  Gallbladder, spleen, pancreas, adrenals are unremarkable.  Kidneys enhance symmetrically.  No focal abnormality or hydronephrosis.   Large and small bowel are grossly unremarkable.  No free fluid, free air or adenopathy.  Uterus and adnexa as well as urinary bladder are grossly unremarkable.   Severe leftward scoliosis and degenerative changes.  Severe tortuosity in the aorta and iliac vessels.  No aneurysm.  No acute bony abnormality.   IMPRESSION: No acute  findings in the abdomen or pelvis.  Review of Systems  Constitutional: Negative for fever and diaphoresis.  HENT: Negative for congestion.   Eyes: Positive for blurred vision.  Respiratory: Positive for shortness of breath. Negative for wheezing.        Dyspnea on exertion.  Cardiovascular: Negative for chest pain, palpitations, orthopnea, leg swelling and PND.  Gastrointestinal: Negative for nausea, vomiting, abdominal pain, blood in stool and melena.  Musculoskeletal: Positive for back pain.       Right flank pain that comes and goes.  Neurological: Negative for dizziness and weakness.  All other systems reviewed and are negative.   Blood pressure 114/57, pulse 58, temperature 98.6 F (37 C), temperature source Oral, resp. rate 18, height 5\' 6"  (1.676 m), weight 82.7 kg (182 lb 5.1 oz), SpO2 97.00%. Physical Exam  Constitutional: She is oriented to person, place, and time. No distress.  HENT:  Head: Normocephalic and atraumatic.  Eyes: EOM are normal. Pupils are equal, round, and reactive to light. No scleral icterus.  Neck: Neck supple. No JVD present.  Cardiovascular: Intact distal pulses.  An irregularly irregular rhythm present.  No murmur heard.       Negative carotid or femoral bruits.  Respiratory: Effort normal and breath sounds normal. She has no wheezes. She has no rales.  GI: Soft. Bowel sounds are normal. There is no tenderness.  Musculoskeletal: She exhibits no edema.  Lymphadenopathy:    She has no cervical adenopathy.  Neurological: She is alert and oriented to person, place, and time. She exhibits normal muscle tone.  Skin: Skin is warm and dry.    Assessment/Plan:  1.  Chronic Atrial fib, rate controlled 2.  Coumadin therapy:  Discontinued 3.  GI Bleed:  S/P EGD and capsule endo. 4.  Bradycardia 5.  LBBB, Chronic 6.  History of nonischemic CM with improved EF of 55%  7. H/O lower extremity edema  Plan:   Patient has a history of bradycardia seen on event monitor in July 2012.   Recommend setting  parameters for BP medications.  Agree with holding benicar for low BP.  Patient schedule for  HIDA scan.   HAGER,BRYAN W 11/30/2011, 5:07 PM        Patient seen and examined. Agree with assessment and plan.  Pt well known to me.  She has h/o chronic LBBB, dypnea with activity and remote h/o nonischemic cardiomyopathy with EF 40% improving to 55%.  She now presents with GI bleed, source beig investigated. Agree with DC coumadin.  Continue low dose Bystolic if BP allows for rate control. Now off ARB secondary to transient low BP, consider resuming at low dose once stable with PRN diuretic if edema recurs.   Lennette Bihari, MD, [facility] 11/30/2011 5:52 PM

## 2011-11-30 NOTE — Progress Notes (Signed)
Patient ID: [name], female   DOB: 1922/04/30, 77 y.o.   MRN: [id number]  Capsule Endoscopy is negative.  Will proceed with ct enterography, HIDA  scan in am. Pt has not had any BM's today.   Her Bp is low 95/40's  She wants to be seen by Dr Tresa Endo, as we have stopped her coumadin as well- will notify Cardiology. Will hold the Benicar for now.

## 2011-12-01 ENCOUNTER — Inpatient Hospital Stay (HOSPITAL_COMMUNITY): Payer: Medicare Other

## 2011-12-01 ENCOUNTER — Encounter (HOSPITAL_COMMUNITY): Payer: Self-pay

## 2011-12-01 DIAGNOSIS — R1013 Epigastric pain: Secondary | ICD-10-CM

## 2011-12-01 DIAGNOSIS — K921 Melena: Secondary | ICD-10-CM

## 2011-12-01 LAB — C-REACTIVE PROTEIN: CRP: 0.04 mg/dL — ABNORMAL LOW (ref <0.60)

## 2011-12-01 LAB — LIPASE, BLOOD: Lipase: 24 U/L (ref 11–59)

## 2011-12-01 LAB — CBC
HCT: 30 % — ABNORMAL LOW (ref 36.0–46.0)
MCH: 30.8 pg (ref 26.0–34.0)
MCHC: 32.7 g/dL (ref 30.0–36.0)
RDW: 15.4 % (ref 11.5–15.5)

## 2011-12-01 MED ORDER — TECHNETIUM TC 99M MEBROFENIN IV KIT
5.1000 | PACK | Freq: Once | INTRAVENOUS | Status: AC | PRN
  Administered 2011-12-01: 5.1 via INTRAVENOUS

## 2011-12-01 MED ORDER — SINCALIDE 5 MCG IJ SOLR: 0.0200 ug/kg | Freq: Once | INTRAMUSCULAR | Status: DC

## 2011-12-01 NOTE — Progress Notes (Signed)
Subjective: *The patient is complaining of moderately severe pain in her back in the midportion with excessive belching. This is typical of her episodes of pain. Capsule endoscopy and CT enterography are unremarkable. No further bleeding.**  Objective: Vital signs in last 24 hours: Temp:  [98 F (36.7 C)-98.6 F (37 C)] 98 F (36.7 C) (12/14 0530) Pulse Rate:  [58-65] 65  (12/14 0530) Resp:  [18] 18  (12/14 0530) BP: (102-114)/(45-57) 102/45 mmHg (12/14 0530) SpO2:  [96 %-97 %] 96 % (12/14 0530) Last BM Date: 11/30/11 (bloody) General:   Alert,  Well-developed, well-nourished, pleasant and cooperative in NAD Head:  Normocephalic and atraumatic. Eyes:  Sclera clear, no icterus.   Conjunctiva pink. Mouth:  No deformity or lesions, dentition normal. Neck:  Supple; no masses or thyromegaly. Heart:  Regular rate and rhythm; no murmurs, clicks, rubs,  or gallops. Abdomen:  Soft, nontender and nondistended. No masses, hepatosplenomegaly or hernias noted. Normal bowel sounds, without guarding, and without rebound.   Msk:  Symmetrical without gross deformities. Normal posture. Pulses:  Normal pulses noted. Extremities:  Without clubbing or edema. Neurologic:  Alert and  oriented x4;  grossly normal neurologically. Skin:  Intact without significant lesions or rashes. Cervical Nodes:  No significant cervical adenopathy. Psych:  Alert and cooperative. Normal mood and affect.  Intake/Output from previous day: 12/13 0701 - 12/14 0700 In: 1452.1 [I.V.:1452.1] Out: -  Intake/Output this shift:    Lab Results:  Basename 12/01/11 0434 11/30/11 0505 11/29/11 0447  WBC 3.5* 3.1* 2.6*  HGB 9.8* 10.2* 8.9*  HCT 30.0* 31.1* 27.6*  PLT 184 179 182   BMET  Basename 11/29/11 0447  NA 139  K 3.9  CL 108  CO2 24  GLUCOSE 112*  BUN 17  CREATININE 0.92  CALCIUM 8.5   LFT No results found for this basename: PROT,ALBUMIN,AST,ALT,ALKPHOS,BILITOT,BILIDIR,IBILI in the last 72 hours PT/INR No  results found for this basename: LABPROT:2,INR:2 in the last 72 hours Hepatitis Panel No results found for this basename: HEPBSAG,HCVAB,HEPAIGM,HEPBIGM in the last 72 hours   Studies/Results: Ct Entero Abd/pelvis W/cm  11/30/2011  *RADIOLOGY REPORT*  Clinical Data:  Abdominal pain with occult bleeding.  CT ABDOMEN AND PELVIS WITH CONTRAST (CT ENTEROGRAPHY)  Technique:  Multidetector CT of the abdomen and pelvis during bolus administration of intravenous contrast. Negative oral contrast VoLumen was given.  Contrast: OMNIPAQUE IOHEXOL 300 MG/ML IV SOLN  Comparison:  11/28/2011  Findings: No abnormal mural enhancement in the stomach.  No abnormal mucosal or transmural enhancement in the small bowel.  The colon cannot be evaluated secondary to the presence of positive contrast agent within the colonic lumen to residual from the CT scan of 2 days ago.  No abnormal small bowel wall thickening.  No small bowel mass lesion is evident.  Low density lesions in the left liver were present on a CT scan from 2008.  Spleen is unremarkable.  The patient does have a 12 mm splenic artery aneurysm in the region of the hilum.  The pancreas, gallbladder, and adrenal glands are unremarkable.  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  Right kidney is normal.  No abdominal aortic aneurysm.  There is no evidence for free fluid or lymphadenopathy in the abdomen.  Imaging through the pelvis shows no free fluid.  Bladder is unremarkable.  Uterus is normal.  There is no adnexal mass.  No colonic diverticulitis.  The terminal ileum is  normal. The appendix is not visualized, but there is no edema or inflammation in the region of the cecum.  Thoracolumbar scoliosis is evident with degenerative changes in the lumbar spine.  IMPRESSION: No CT findings to explain the patient's history of occult GI bleeding.  Splenic artery aneurysm.  Original Report  Authenticated By: ERIC A. MANSELL, M.D.    Assessment: **#1 intermittent GI bleeding of unknown origin.   Work up including endoscopy, capsule endoscopy and CT enterography were negative. Prior colonoscopy was negative. #2 abdominal pain. Etiology has not been determined. Pain from chronic cholecystitis is a possibility although somewhat atypical in presentation.  At this point it is unclear whether the patient has 2 separate processes, namely, intermittent GI bleeding and abdominal pain, or the 2 are linked. She is going to undergo a HIDA scan today. This could explain her abdominal pain but not her bleeding.         Barbette Hair. Arlyce Dice, MD, [facility] [phone]   Melvia Heaps  12/01/2011, 8:24 AM

## 2011-12-01 NOTE — Procedures (Signed)
Capsule Endoscopy;  This was a complete study, good prep. Normal appearing small bowel with no findings to explain bleeding or abdominal pain. There does appear to be some melenic stool in the area of the cecum.

## 2011-12-02 ENCOUNTER — Encounter (HOSPITAL_COMMUNITY)
Admission: AD | Disposition: A | Discharge status: Home / Self Care | Payer: Self-pay | Source: Ambulatory Visit | Attending: Gastroenterology

## 2011-12-02 ENCOUNTER — Encounter (HOSPITAL_COMMUNITY): Payer: Self-pay

## 2011-12-02 ENCOUNTER — Inpatient Hospital Stay (HOSPITAL_COMMUNITY): Payer: Medicare Other

## 2011-12-02 ENCOUNTER — Other Ambulatory Visit (HOSPITAL_COMMUNITY): Payer: Medicare Other

## 2011-12-02 DIAGNOSIS — R1013 Epigastric pain: Secondary | ICD-10-CM

## 2011-12-02 DIAGNOSIS — K921 Melena: Secondary | ICD-10-CM

## 2011-12-02 HISTORY — PX: ENTEROSCOPY: SHX5533

## 2011-12-02 LAB — URINE CULTURE
Colony Count: >=100000
Culture  Setup Time: 201212140145

## 2011-12-02 LAB — COMPREHENSIVE METABOLIC PANEL
AST: 11 U/L (ref 0–37)
BUN: 10 mg/dL (ref 6–23)
CO2: 22 mEq/L (ref 19–32)
Calcium: 9.1 mg/dL (ref 8.4–10.5)
Creatinine, Ser: 0.98 mg/dL (ref 0.50–1.10)
GFR calc Af Amer: 58 mL/min — ABNORMAL LOW (ref >90)
GFR calc non Af Amer: 50 mL/min — ABNORMAL LOW (ref >90)
Glucose, Bld: 134 mg/dL — ABNORMAL HIGH (ref 70–99)

## 2011-12-02 LAB — CBC
MCH: 30.6 pg (ref 26.0–34.0)
Platelets: 173 10*3/uL (ref 150–400)
RBC: 3.1 MIL/uL — ABNORMAL LOW (ref 3.87–5.11)

## 2011-12-02 LAB — D-DIMER, QUANTITATIVE: D-Dimer, Quant: 1.24 ug/mL-FEU — ABNORMAL HIGH (ref 0.00–0.48)

## 2011-12-02 LAB — PREPARE RBC (CROSSMATCH)

## 2011-12-02 SURGERY — ENTEROSCOPY
Anesthesia: Moderate Sedation

## 2011-12-02 MED ORDER — FENTANYL CITRATE 0.05 MG/ML IJ SOLN
INTRAMUSCULAR | Status: DC | PRN
  Administered 2011-12-02 (×3): 25 ug via INTRAVENOUS

## 2011-12-02 MED ORDER — GLYCOPYRROLATE 0.2 MG/ML IJ SOLN
INTRAMUSCULAR | Status: DC | PRN
  Administered 2011-12-02: 0.2 mg via INTRAVENOUS

## 2011-12-02 MED ORDER — PEG-KCL-NACL-NASULF-NA ASC-C 100 G PO SOLR
1.0000 | Freq: Once | ORAL | Status: DC
  Filled 2011-12-02: qty 1

## 2011-12-02 MED ORDER — IOHEXOL 300 MG/ML  SOLN
100.0000 mL | Freq: Once | INTRAMUSCULAR | Status: AC | PRN
  Administered 2011-12-02: 100 mL via INTRAVENOUS

## 2011-12-02 MED ORDER — PEG-KCL-NACL-NASULF-NA ASC-C 100 G PO SOLR
1.0000 | Freq: Once | ORAL | Status: AC
  Administered 2011-12-02: 100 g via ORAL
  Filled 2011-12-02: qty 1

## 2011-12-02 MED ORDER — FUROSEMIDE 10 MG/ML IJ SOLN
20.0000 mg | Freq: Once | INTRAMUSCULAR | Status: AC
  Administered 2011-12-02: 20 mg via INTRAVENOUS
  Filled 2011-12-02: qty 2

## 2011-12-02 MED ORDER — MIDAZOLAM HCL 10 MG/2ML IJ SOLN
INTRAMUSCULAR | Status: DC | PRN
  Administered 2011-12-02 (×3): 2 mg via INTRAVENOUS

## 2011-12-02 MED ORDER — HYDROMORPHONE HCL PF 1 MG/ML IJ SOLN
0.5000 mg | INTRAMUSCULAR | Status: DC | PRN
  Administered 2011-12-03 – 2011-12-06 (×10): 1 mg via INTRAVENOUS
  Filled 2011-12-02 (×11): qty 1

## 2011-12-02 NOTE — Progress Notes (Signed)
Patient ID: [name], female   DOB: 26-Jan-1922, 77 y.o.   MRN: [id number] [facility] Progress Note  Subjective: [name]  Does not feel well this am. She has had two dark tarry bloody bm's this am. She is also having an episode of right posterior mid back /lower chest pain with belching, pain is sharp-using Dliaudid. Son at bedside and updated. Objective:  Vital signs in last 24 hours: Temp:  [97.8 F (36.6 C)-98.4 F (36.9 C)] 97.8 F (36.6 C) (12/15 0515) Pulse Rate:  [57-63] 59  (12/15 0515) Resp:  [18] 18  (12/15 0515) BP: (92-97)/(46-60) 93/59 mmHg (12/15 0515) SpO2:  [96 %-98 %] 96 % (12/15 0515) Last BM Date: 12/02/11 General:   Alert,  Well-developed,elderly    in NAD,uncomfortable appearing Heart: irr Regular rate and rhythm; no murmurs Pulm;clear, Abdomen:  Soft, tender right upper lateral  And in to right back with deep palpation , nondistended. Normal bowel sounds, without guarding, Extremities:  Without edema. Neurologic:  Alert and  oriented x4;  grossly normal neurologically. Psych:  Alert and cooperative. Normal mood and affect.  Intake/Output from previous day: 12/14 0701 - 12/15 0700 In: 240 [P.O.:240] Out: -  Intake/Output this shift:    Lab Results:  Basename 12/01/11 0434 11/30/11 0505  WBC 3.5* 3.1*  HGB 9.8* 10.2*  HCT 30.0* 31.1*  PLT 184 179      Assessment / Plan:  77 yo female with persistent slow Gi bleeding of unclear etiology. Extensive workup unrevealing thus far. Will proceed with enteroscopy today, if negative probable colonoscopy Stat hgb, transfuse as needed.  Right mid back/chest pain with episodic intense pain -unclear if related to bleeding or separate process Will check Ct angio chest, r/o PE, other inflammatory process.  Active Problems:  * No active hospital problems. *      LOS: 5 days   [name]  12/02/2011, 9:24 AM

## 2011-12-03 ENCOUNTER — Inpatient Hospital Stay (HOSPITAL_COMMUNITY): Payer: Medicare Other

## 2011-12-03 ENCOUNTER — Encounter (HOSPITAL_COMMUNITY): Payer: Self-pay

## 2011-12-03 ENCOUNTER — Encounter (HOSPITAL_COMMUNITY)
Admission: AD | Disposition: A | Discharge status: Home / Self Care | Payer: Self-pay | Source: Ambulatory Visit | Attending: Gastroenterology

## 2011-12-03 DIAGNOSIS — K921 Melena: Secondary | ICD-10-CM

## 2011-12-03 DIAGNOSIS — R1013 Epigastric pain: Secondary | ICD-10-CM

## 2011-12-03 HISTORY — PX: COLONOSCOPY: SHX5424

## 2011-12-03 LAB — CBC
HCT: 27.9 % — ABNORMAL LOW (ref 36.0–46.0)
HCT: 26.2 % — ABNORMAL LOW (ref 36.0–46.0)
Hemoglobin: 8.5 g/dL — ABNORMAL LOW (ref 12.0–15.0)
MCV: 93.2 fL (ref 78.0–100.0)
Platelets: 173 10*3/uL (ref 150–400)
RBC: 2.97 MIL/uL — ABNORMAL LOW (ref 3.87–5.11)
RDW: 15.2 % (ref 11.5–15.5)
WBC: 3.9 10*3/uL — ABNORMAL LOW (ref 4.0–10.5)
WBC: 4.3 10*3/uL (ref 4.0–10.5)

## 2011-12-03 LAB — TYPE AND SCREEN
ABO/RH(D): B POS
Antibody Screen: NEGATIVE
Unit division: 0

## 2011-12-03 SURGERY — COLONOSCOPY
Anesthesia: Moderate Sedation

## 2011-12-03 MED ORDER — MIDAZOLAM HCL 2 MG/2ML IJ SOLN
INTRAMUSCULAR | Status: AC
Start: 2011-12-03 — End: 2011-12-04
  Filled 2011-12-03: qty 2

## 2011-12-03 MED ORDER — MIDAZOLAM HCL 5 MG/5ML IJ SOLN
INTRAMUSCULAR | Status: AC | PRN
  Administered 2011-12-03 (×3): 1 mg via INTRAVENOUS

## 2011-12-03 MED ORDER — LIDOCAINE-EPINEPHRINE 2 %-1:100000 IJ SOLN
INTRAMUSCULAR | Status: AC
  Filled 2011-12-03: qty 1

## 2011-12-03 MED ORDER — FENTANYL CITRATE 0.05 MG/ML IJ SOLN
INTRAMUSCULAR | Status: DC | PRN
  Administered 2011-12-03 (×3): 25 ug via INTRAVENOUS

## 2011-12-03 MED ORDER — TECHNETIUM TC 99M-LABELED RED BLOOD CELLS IV KIT
20.9000 | PACK | Freq: Once | INTRAVENOUS | Status: AC | PRN
  Administered 2011-12-03: 20.9 via INTRAVENOUS

## 2011-12-03 MED ORDER — FENTANYL CITRATE 0.05 MG/ML IJ SOLN
INTRAMUSCULAR | Status: AC
  Filled 2011-12-03: qty 2

## 2011-12-03 MED ORDER — FENTANYL CITRATE 0.05 MG/ML IJ SOLN
INTRAMUSCULAR | Status: AC | PRN
  Administered 2011-12-03 (×3): 50 ug via INTRAVENOUS

## 2011-12-03 MED ORDER — IOHEXOL 300 MG/ML  SOLN
150.0000 mL | Freq: Once | INTRAMUSCULAR | Status: AC | PRN
  Administered 2011-12-03: 135 mL via INTRAVENOUS

## 2011-12-03 MED ORDER — LIDOCAINE HCL 1 % IJ SOLN
INTRAMUSCULAR | Status: AC
  Filled 2011-12-03: qty 20

## 2011-12-03 MED ORDER — MIDAZOLAM HCL 10 MG/2ML IJ SOLN
INTRAMUSCULAR | Status: DC | PRN
  Administered 2011-12-03 (×3): 2 mg via INTRAVENOUS

## 2011-12-03 NOTE — Procedures (Signed)
Mesenteric arteriogram demonstrates a tiny area of active bleeding with the hepatic flexure of the colon arising from a branch of the SMA.  Despite subselective injections of multiple branches of the SMA, the area of bleeding was not seen after the initial SMA arteriogram and thus embolization was not performed.   Right groin access site controlled with manual compression. No immediate complications.

## 2011-12-03 NOTE — Progress Notes (Signed)
Colonoscopy demonstrated blood throughout the colon and the terminal ileum. Source could not be determined although I am suspicious that it is proximal to the terminal ileum.  Plan-nuclear medicine bleeding scan

## 2011-12-03 NOTE — ED Notes (Signed)
 n.s.s. Added to her saline lock right arm just prior to procedure, and it is through this iv that her sedation meds were given.

## 2011-12-03 NOTE — ED Notes (Signed)
She continues to ask questions regarding the source of her bleeding.  Dr. Grace Isaac keeps her informed of our methods/progress during the procedure, for which the pt. Is grateful.

## 2011-12-03 NOTE — ED Notes (Signed)
 of nitroglycerine given intaarterially by our radiologist at this time.

## 2011-12-03 NOTE — ED Notes (Signed)
She is following the physician's instructions quite nicely.  She tells me she is now comfortable.

## 2011-12-03 NOTE — ED Notes (Signed)
I am giving her a 500 cc ivf fluid bolus at this time

## 2011-12-03 NOTE — Progress Notes (Signed)
Patient ID: [name], female   DOB: [birth year], 77 y.o.   MRN: [id number] [facility] Progress Note  Subjective: [name] continues to bleed , passes dark red blood with bowel prep. No c/o abdominal pain. Continues with right back pain, belching intermittently. Feels weak, no dizziness.  Objective:  Vital signs in last 24 hours: Temp:  [97.3 F (36.3 C)-98 F (36.7 C)] 98 F (36.7 C) (12/16 0607) Pulse Rate:  [60-71] 71  (12/16 0903) Resp:  [12-18] 16  (12/16 0607) BP: (80-139)/(50-95) 110/53 mmHg (12/16 0607) SpO2:  [90 %-100 %] 95 % (12/16 0607) Last BM Date: 12/03/11 General:   Alert,  Well-developed,    in NAD,pale Heart: irr Regular rate and rhythm; no murmurs Pulm;clear Abdomen:  Soft, nontender and nondistended. Normal bowel sounds, without guarding, and without rebound.   Extremities:  Without edema. Neurologic:  Alert and  oriented x4;  grossly normal neurologically. Psych:  Alert and cooperative. Normal mood and affect.  Intake/Output from previous day: 12/15 0701 - 12/16 0700 In: 1071.3 [P.O.:240; I.V.:831.3] Out: 610 [Urine:600; Stool:10] Intake/Output this shift:    Lab Results:  Basename 12/03/11 0602 12/02/11 1130 12/01/11 0434  WBC 3.9* 4.1 3.5*  HGB 9.1* 9.5* 9.8*  HCT 27.9* 29.4* 30.0*  PLT 173 173 184   BMET  Basename 12/02/11 1001  NA 139  K 4.2  CL 109  CO2 22  GLUCOSE 134*  BUN 10  CREATININE 0.98  CALCIUM 9.1   LFT  Basename 12/02/11 1001  PROT 5.3*  ALBUMIN 3.1*  AST 11  ALT 8  ALKPHOS 55  BILITOT 0.5  BILIDIR --  IBILI --     Assessment / Plan: #1 77 yo female with persistent slow GI bleed X one week,Extensive workup unrevealing thus far. For colonoscopy this am. She had colon 6/12 which showed mild diverticulosis #2 Anemia-secondary to blood loss- will transfuse for hgb less than 9.0 #3 Atrial Fib- off coumadin #4 Acute right back pain, eructation-  I think her pain is from thoracic compression  frx.     LOS: 6 days   [name]  12/03/2011, 9:07 AM

## 2011-12-03 NOTE — Op Note (Signed)
[facility] [address] [place], [place]  [address]  COLONOSCOPY PROCEDURE REPORT  PATIENT:  Christine, Matthews  MR#:  [id number] BIRTHDATE:  Apr 19, 1922, 89 yrs. old  GENDER:  female ENDOSCOPIST:  Barbette Hair. Arlyce Dice, MD REF. BY:  Butch Penny, M.D. PROCEDURE DATE:  12/03/2011 PROCEDURE:  Diagnostic Colonoscopy ASA CLASS:  Class III INDICATIONS:  Gastrointestinal hemorrhage MEDICATIONS:   These medications were titrated to patient response per physician's verbal order, Fentanyl 75 mcg IV, Versed 6 mg IV  DESCRIPTION OF PROCEDURE:   After the risks benefits and alternatives of the procedure were thoroughly explained, informed consent was obtained.  Digital rectal exam was performed and revealed no abnormalities.   The Pentax Colonoscope [id number] endoscope was introduced through the anus and advanced to the ileum, without limitations.  The quality of the prep was good, using MoviPrep.  The instrument was then slowly withdrawn as the colon was fully examined. <<PROCEDUREIMAGES>>  FINDINGS:  Blood was found (see image5, image8, image4, and image10). There was blood throughout the colon and the terminal ileum  Diverticula were found in the sigmoid colon. Few diverticula (see image12).  This was otherwise a normal examination of the colon (see image15).   Retroflexed views in the rectum revealed no abnormalities.    The time to cecum =  minutes. The scope was then withdrawn in  1) 25  minutes from the cecum and the procedure completed. COMPLICATIONS:  None ENDOSCOPIC IMPRESSION: 1) Blood throughout the colon and terminal ileum 2) Diverticula in the sigmoid colon 3) Otherwise normal examination RECOMMENDATIONS: Stat bleeding scan  REPEAT EXAM:  No  ______________________________ Barbette Hair. Arlyce Dice, MD  cc: Dr. Stan Head  CC:  n. eSIGNED:   Barbette Hair. Kaplan at 12/03/2011 10:20 AM  [place] Maus, [phone]

## 2011-12-03 NOTE — ED Notes (Signed)
Note:  All three clinicians mentioned herein were present in x-ray at 1610 hours; and remained continuously during procedure.

## 2011-12-03 NOTE — ED Notes (Signed)
Shortly after sedation began, we changed o2 to 4 l.p.m.

## 2011-12-03 NOTE — ED Notes (Signed)
She is sleepy, and is awake, and has just asked the physician a coherent question.  She is in no distress.  She does c/o some mild chronic back pain.

## 2011-12-03 NOTE — ED Notes (Signed)
 of nitroglycerine just given intraarterially by the radiologist at this time.

## 2011-12-04 ENCOUNTER — Inpatient Hospital Stay (HOSPITAL_COMMUNITY): Payer: Medicare Other

## 2011-12-04 ENCOUNTER — Encounter: Payer: Self-pay | Admitting: Gastroenterology

## 2011-12-04 ENCOUNTER — Encounter (HOSPITAL_COMMUNITY)
Admission: AD | Disposition: A | Discharge status: Home / Self Care | Payer: Self-pay | Source: Ambulatory Visit | Attending: Gastroenterology

## 2011-12-04 ENCOUNTER — Encounter (HOSPITAL_COMMUNITY): Payer: Self-pay | Admitting: Gastroenterology

## 2011-12-04 DIAGNOSIS — R1013 Epigastric pain: Secondary | ICD-10-CM

## 2011-12-04 DIAGNOSIS — K921 Melena: Secondary | ICD-10-CM

## 2011-12-04 HISTORY — PX: COLONOSCOPY: SHX5424

## 2011-12-04 LAB — CBC
HCT: 31.4 % — ABNORMAL LOW (ref 36.0–46.0)
Hemoglobin: 9.3 g/dL — ABNORMAL LOW (ref 12.0–15.0)
Hemoglobin: 10.2 g/dL — ABNORMAL LOW (ref 12.0–15.0)
MCH: 29.6 pg (ref 26.0–34.0)
MCH: 29.4 pg (ref 26.0–34.0)
MCHC: 32.4 g/dL (ref 30.0–36.0)
MCV: 90.5 fL (ref 78.0–100.0)
RBC: 3.47 MIL/uL — ABNORMAL LOW (ref 3.87–5.11)

## 2011-12-04 LAB — HEMOGLOBIN AND HEMATOCRIT, BLOOD: HCT: 31.1 % — ABNORMAL LOW (ref 36.0–46.0)

## 2011-12-04 SURGERY — COLONOSCOPY
Anesthesia: Moderate Sedation

## 2011-12-04 MED ORDER — CIPROFLOXACIN HCL 500 MG PO TABS
500.0000 mg | ORAL_TABLET | ORAL | Status: DC
  Administered 2011-12-04 – 2011-12-07 (×4): 500 mg via ORAL
  Filled 2011-12-04 (×5): qty 1

## 2011-12-04 MED ORDER — GADOBENATE DIMEGLUMINE 529 MG/ML IV SOLN
20.0000 mL | Freq: Once | INTRAVENOUS | Status: AC | PRN
  Administered 2011-12-04: 17 mL via INTRAVENOUS

## 2011-12-04 MED ORDER — POLYETHYLENE GLYCOL 3350 17 GM/SCOOP PO POWD
0.5000 | Freq: Once | ORAL | Status: AC
  Administered 2011-12-04: 0.5 via ORAL
  Filled 2011-12-04: qty 255

## 2011-12-04 MED ORDER — SODIUM CHLORIDE 0.45 % IV SOLN: Freq: Once | INTRAVENOUS | Status: DC

## 2011-12-04 MED ORDER — FENTANYL CITRATE 0.05 MG/ML IJ SOLN
INTRAMUSCULAR | Status: DC | PRN
  Administered 2011-12-04 (×4): 25 ug via INTRAVENOUS

## 2011-12-04 MED ORDER — MIDAZOLAM HCL 5 MG/5ML IJ SOLN
INTRAMUSCULAR | Status: DC | PRN
  Administered 2011-12-04: .6 mg via INTRAVENOUS
  Administered 2011-12-04: 1 mg via INTRAVENOUS

## 2011-12-04 MED ORDER — PANTOPRAZOLE SODIUM 40 MG PO TBEC
40.0000 mg | DELAYED_RELEASE_TABLET | Freq: Every day | ORAL | Status: DC
  Filled 2011-12-04 (×2): qty 1

## 2011-12-04 NOTE — Op Note (Signed)
[facility] [address] [place], [place]  [address]  COLONOSCOPY PROCEDURE REPORT  PATIENT:  [name], [name]  MR#:  [id number] BIRTHDATE:  09-12-1922, 89 yrs. old  GENDER:  female ENDOSCOPIST:  Rachael Fee, MD PROCEDURE DATE:  12/04/2011 PROCEDURE:  Colonoscopy for control of bleeding ASA CLASS:  Class III INDICATIONS:  recurrent GI bleeding; has had 2 EGDs, 2 colonoscopies, 2 capsule endoscopie, yesterday A small area of active extravasation was noted from a distal branch vessel of the right colic within the right upper abdominal quadrant correlating with the area of active GI bleed seen on the preprocedural tag red blood cell study MEDICATIONS:  Fentanyl 100 mcg IV, Versed 8 mg IV  DESCRIPTION OF PROCEDURE:   After the risks benefits and alternatives of the procedure were thoroughly explained, informed consent was obtained.  Digital rectal exam was performed and revealed no rectal masses.   The Pentax Colonoscope [id number] endoscope was introduced through the anus and advanced to the cecum, which was identified by both the appendix and ileocecal valve, without limitations.  The quality of the prep was good.. The instrument was then slowly withdrawn as the colon was fully examined.<<PROCEDUREIMAGES>> FINDINGS:  There was fresh red blood throughout colon. I examined and flushed the right colon, hepatic flexure region extensively and noted a pinpoint area of normal appearing colon mucosa that was actively oozing blood. This was treated with placement of 4 endoclips achieving good hemostasis (see image8, image9, image13, image16, and image19).  There was a small sessile polyp in ascending colon that was not removed.  This was otherwise a normal examination of the colon (see image3).   Retroflexed views in the rectum revealed not done. COMPLICATIONS:  None  ENDOSCOPIC IMPRESSION: 1) Active bleeding from a pinpoint area of normal appearing mucosa in the hepatic  flexure region of colon. This was treated with placement of several endoclips.  I suspect this is a Dieulafoy's lesion. 2) Small ascending colon polyp that was not removed. 2) Otherwise normal examination  RECOMMENDATIONS: Observe clinically for rebleeding, transfuses blood products as needed. If she has significant rebleeding, segmental resection of the region of colon containing the four endoclips (hepatic flexure region of colon) will be recommended.  ______________________________ Rachael Fee, MD  cc: Melvia Heaps, MD; Stan Head, MD  n. eSIGNED:   Rachael Fee at 12/04/2011 03:20 PM  [place], [phone]

## 2011-12-04 NOTE — Progress Notes (Signed)
[facility] Progress Note  Subjective: Feels okay this evening. No black stools / rectal bleeding this am.  Objective:  Vital signs in last 24 hours: Temp:  [97.2 F (36.2 C)-98.4 F (36.9 C)] 98 F (36.7 C) (12/17 0530) Pulse Rate:  [54-98] 62  (12/17 0530) Resp:  [13-21] 18  (12/17 0530) BP: (89-117)/(40-70) 115/70 mmHg (12/17 0530) SpO2:  [92 %-99 %] 95 % (12/17 0530) Last BM Date: 12/03/11 General:   Well-developed,  white female in NAD Heart:  Regular rate and rhythm Abdomen:  Soft, nontender and nondistended. Normal bowel sounds, without guarding, and without rebound.   Extremities:  Without edema. Neurologic:  Alert and  oriented x4;  grossly normal neurologically. Psych:  cooperative. Normal mood and affect.   Lab Results:  Basename 12/04/11 0810 12/04/11 0500 12/03/11 1415 12/03/11 0602  WBC -- 4.0 4.3 3.9*  HGB 10.4* 9.3* 8.5* --  HCT 31.1* 28.7* 26.2* --  PLT -- 145* 170 173   BMET  Basename 12/02/11 1001  NA 139  K 4.2  CL 109  CO2 22  GLUCOSE 134*  BUN 10  CREATININE 0.98  CALCIUM 9.1   LFT  Basename 12/02/11 1001  PROT 5.3*  ALBUMIN 3.1*  AST 11  ALT 8  ALKPHOS 55  BILITOT 0.5  BILIDIR --  IBILI --     Assessment / Plan: 61. 77 year old female with lower GI bleed.  Colonoscopy revealed blood throughout the colon and terminal ileum. RBC tagged scan positive for activity in area of hepatic flexure. Unfortunately her arteriogram negative for bleeding so no intervention was done. Will reprep patient this am for colonoscopy to try again to localize area of bleeding.   2. Anemia-secondary to blood loss, s/p 2 untis PRBC early this am. Hemoglobin now at 10.4. 3. Atrial Fib- off coumadin  4. UTI - culture positive for E. Coli.Will begin oral Cipro..  .   LOS: 7 days   Willette Cluster  12/04/2011, 11:39 AM

## 2011-12-04 NOTE — Progress Notes (Signed)
   We discussed her situation this AM at Monday sign out rounds, all agreed that repeat colonoscopy with particular attention to hepatic flexure was best next step.

## 2011-12-04 NOTE — Interval H&P Note (Signed)
History and Physical Interval Note:  12/04/2011 2:37 PM  [name]  has presented today for surgery, with the diagnosis of lower gastrointestinal hemorrhage  The various methods of treatment have been discussed with the patient and family. After consideration of risks, benefits and other options for treatment, the patient has consented to  Procedure(s): COLONOSCOPY as a surgical intervention .  The patients' history has been reviewed, patient examined, no change in status, stable for surgery.  I have reviewed the patients' chart and labs.  Questions were answered to the patient's satisfaction.     Rob Bunting

## 2011-12-05 ENCOUNTER — Encounter (HOSPITAL_COMMUNITY): Payer: Self-pay | Admitting: Gastroenterology

## 2011-12-05 MED ORDER — PANTOPRAZOLE SODIUM 40 MG PO TBEC
40.0000 mg | DELAYED_RELEASE_TABLET | Freq: Two times a day (BID) | ORAL | Status: DC
  Administered 2011-12-05 – 2011-12-07 (×4): 40 mg via ORAL
  Filled 2011-12-05 (×4): qty 1

## 2011-12-05 NOTE — Progress Notes (Signed)
Subjective: Colonoscopy yesterday, results in chart.  Hb has been stable for past 2 days.   She's had no further overt bleeding.  She is very bothered by "gas attacks," belching.     Objective: Vital signs in last 24 hours: Temp:  [97.7 F (36.5 C)-98.5 F (36.9 C)] 97.7 F (36.5 C) (12/18 0600) Pulse Rate:  [60-81] 60  (12/18 0600) Resp:  [9-38] 18  (12/18 0600) BP: (89-178)/(22-117) 101/48 mmHg (12/18 0600) SpO2:  [89 %-100 %] 92 % (12/18 0600) Last BM Date: 12/04/11 General: alert and oriented times 3 Heart: regular rate and rythm Abdomen: soft, non-tender, non-distended, normal bowel sounds    Lab Results:  Basename 12/05/11 0015 12/04/11 1357 12/04/11 0810 12/04/11 0500 12/03/11 1415  WBC -- 5.0 -- 4.0 4.3  HGB 10.8* 10.2* 10.4* -- --  PLT -- 162 -- 145* 170  MCV -- 90.5 -- 91.4 93.2   BMET  Basename 12/02/11 1001  NA 139  K 4.2  CL 109  CO2 22  GLUCOSE 134*  BUN 10  CREATININE 0.98  CALCIUM 9.1    Assessment/Plan: 77 y.o. female with lower GI bleeding, dyspepsia  Clips placed at hepatic flexure bleeding site, hopefully it will not rebleed.  Will observe another 24 hours in hosp.   She is very dyspeptic, belching a lot.  I am going to increase her PPI to twice daily, perhaps this is reflux related.  She was also found to have a UTI and cipro was started.  Will advance diet, CBC in AM (will d/c the every 8 hour checks).     Rob Bunting, MD  12/05/2011, 7:40 AM [facility] Pager [phone]

## 2011-12-06 DIAGNOSIS — R1013 Epigastric pain: Secondary | ICD-10-CM

## 2011-12-06 DIAGNOSIS — K921 Melena: Secondary | ICD-10-CM

## 2011-12-06 DIAGNOSIS — K3189 Other diseases of stomach and duodenum: Secondary | ICD-10-CM

## 2011-12-06 LAB — CBC
HCT: 26.3 % — ABNORMAL LOW (ref 36.0–46.0)
Hemoglobin: 8.5 g/dL — ABNORMAL LOW (ref 12.0–15.0)
MCH: 29.8 pg (ref 26.0–34.0)
MCV: 92.3 fL (ref 78.0–100.0)
RBC: 2.85 MIL/uL — ABNORMAL LOW (ref 3.87–5.11)

## 2011-12-06 MED ORDER — RIFAXIMIN 550 MG PO TABS
550.0000 mg | ORAL_TABLET | Freq: Three times a day (TID) | ORAL | Status: DC
  Administered 2011-12-06 – 2011-12-07 (×4): 550 mg via ORAL
  Filled 2011-12-06 (×6): qty 1

## 2011-12-06 NOTE — Progress Notes (Signed)
[facility] Progress Note  SUBJECTIVE: Weak, just doesn't feel well. No further bleeding. Continues with frequent belching which is causing back discomfort  OBJECTIVE:  Vital signs in last 24 hours: Temp:  [98.1 F (36.7 C)-98.3 F (36.8 C)] 98.3 F (36.8 C) (12/19 0555) Pulse Rate:  [60-61] 61  (12/19 0555) Resp:  [18-20] 19  (12/19 0555) BP: (95-108)/(42-65) 95/58 mmHg (12/19 0555) SpO2:  [92 %] 92 % (12/19 0555) Last BM Date: 12/05/11 General:   Well-developed,  white female in NAD Heart:  Regular rate and rhythm; no murmurs Lungs: CTA bilaterally Abdomen:  Soft, nontender and nondistended. Normal bowel sounds. Extremities:  Without edema. Neurologic:  Alert and  oriented x4;  grossly normal neurologically. Psych:  Cooperative. Normal mood and affect.  Lab Results:  Basename 12/06/11 0520 12/05/11 0015 12/04/11 1357 12/04/11 0500  WBC 3.4* -- 5.0 4.0  HGB 8.5* 10.8* 10.2* --  HCT 26.3* 31.7* 31.4* --  PLT 121* -- 162 145*   ASSESSMENT / PLAN: 74.  77 year old female with lower GI bleed,s/p colonoscopy endoclipping of ?dieulafoy lesion at hepatic flexure. No further bleeding.  2. Anemia-secondary to blood loss, s/p 2 untis PRBC yesterday with hemoglobin rising to 10.4. After additional bleeding yesterday her hemoglobin fell to 8.5. Patient is weak, slightly SOB. Will transfuse additional unit.   3. Atrial Fib- off coumadin  4. UTI - culture positive for E. Coli. On day #2 of Cipro. 5. Persistent belching, etiology unclear. On bid PPI. Will treat empirically for small bowel bacterial overgrowth.  Active Problems:  * No active hospital problems. *      LOS: 9 days   Willette Cluster  12/06/2011, 9:45 AM   ________________________________________________________________________  [facility] MD note:  I personally examined the patient, reviewed the data and agree with the assessment and plan described above.  She has had no further overt GI bleeding but  could use another 1 unit blood today (likely re-equilibration).  She is still very dyspeptic, this is a chronic problem for her but worse lately.  We doubled her PPI yesterday and added empiric abx today for potential SBBO.  If she responds well to the transfusion and no recurrent gi bleeding, the likely d/c tomorrow.  She requested regular diet rather than low salt and so I made that change.   Rob Bunting, MD [facility] Pager [phone]

## 2011-12-07 LAB — TYPE AND SCREEN
ABO/RH(D): B POS
Antibody Screen: NEGATIVE
Unit division: 0
Unit division: 0

## 2011-12-07 LAB — CBC
HCT: 30.9 % — ABNORMAL LOW (ref 36.0–46.0)
MCHC: 32 g/dL (ref 30.0–36.0)
MCV: 91.2 fL (ref 78.0–100.0)
RDW: 16.4 % — ABNORMAL HIGH (ref 11.5–15.5)

## 2011-12-07 MED ORDER — CIPROFLOXACIN HCL 500 MG PO TABS: 500.0000 mg | ORAL_TABLET | Freq: Every day | ORAL | Status: AC

## 2011-12-07 MED ORDER — CIPROFLOXACIN HCL 500 MG PO TABS: 500.0000 mg | ORAL_TABLET | ORAL | Status: DC

## 2011-12-07 MED ORDER — RIFAXIMIN 550 MG PO TABS: 550.0000 mg | ORAL_TABLET | Freq: Three times a day (TID) | ORAL | Status: AC

## 2011-12-07 NOTE — Progress Notes (Signed)
Pt d/c'd  Home today. No changes since am assessment. Pt verbalized understanding of d/c meds and instructions.   [name] 12/07/2011

## 2011-12-07 NOTE — Progress Notes (Signed)
Subjective: Received one unit PRBC yesterday.  Started xifaxin for possible SIBO.    Overnight she slept very well.  EAting well.  No overt bleeding (just some thin greenish stool once).  No abd pains.  Still a bit dyspeptic but that seems to be improving as well.   Objective: Vital signs in last 24 hours: Temp:  [98 F (36.7 C)-98.8 F (37.1 C)] 98 F (36.7 C) (12/20 0555) Pulse Rate:  [57-69] 64  (12/20 0555) Resp:  [18-20] 18  (12/20 0555) BP: (91-109)/(45-64) 91/55 mmHg (12/20 0555) SpO2:  [93 %-95 %] 93 % (12/20 0555) Last BM Date: 12/05/11 General: alert and oriented times 3 Heart: regular rate and rythm Abdomen: soft, non-tender, non-distended, normal bowel sounds   Lab Results:  Basename 12/06/11 0520 12/05/11 0015 12/04/11 1357  WBC 3.4* -- 5.0  HGB 8.5* 10.8* 10.2*  PLT 121* -- 162  MCV 92.3 -- 90.5     Assessment/Plan: 77 y.o. female lower GI bleeding, resolved  CBC this AM and if that looks appropriate we will d/c her home.  She will stay on bid PPI, xifaxin for 5 more days for possible sibo.  She asked for a prescription for vicodin because the IV pain meds have helped with her dyspepsia and that seems reasonable (25 pills).      Rob Bunting, MD  12/07/2011, 9:06 AM [facility] Pager [phone]

## 2011-12-08 ENCOUNTER — Other Ambulatory Visit: Payer: Self-pay | Admitting: Gastroenterology

## 2011-12-08 MED ORDER — HYDROCODONE-ACETAMINOPHEN 5-500 MG PO TABS: ORAL_TABLET | ORAL | Status: DC

## 2011-12-08 NOTE — Telephone Encounter (Signed)
Left message for patient to call back  

## 2011-12-08 NOTE — Telephone Encounter (Signed)
Per progress note from 12/07/11 Dr Christella Hartigan was going to prescribe 25 vicodin.  I can't see where the rx was sent.  Discussed with Dr Leone Payor we will send rx to her pharmacy.  I have left a message for the patient that rx has been sent in.

## 2011-12-15 NOTE — Discharge Summary (Signed)
[facility] Discharge Summary  Name: [name] MRN: [id number] DOB: November 01, 1922 77 y.o. PCP:  [name], MD  Date of Admission: 11/27/2011  6:17 PM Date of Discharge: 12/07/2011 Attending Physician: Melvia Heaps, MD                                      Primary GI:  Stan Head, MD  Discharge Diagnosis: 1. 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   2. Anemia of acute blood loss, s/p total of 3 units of PRBCs this admission.  3. Gas, belching, mid back pain of unclear etiology, improved but not resolved. EGD was normal. Gallbladder unremarkable on ultrasound, normal HIDA scan.  Given trial of Xifaxan, symptoms had improved by time of discharge. 4  History of atrial fibrillation, Coumadin on hold  5. History of IBS  6. history of pancreatic insufficiency 7 Lower thoracic spine compression fracture of indeterminant age   Consultations:  cardiology for management of blood pressure problems.   Procedures Performed:  Ct Angio Chest W/cm &/or Wo Cm  12/02/2011  *RADIOLOGY REPORT*  Clinical Data:  Right posterior chest pain  CT ANGIOGRAPHY CHEST WITH CONTRAST  Technique:  Multidetector CT imaging of the chest was performed using the standard protocol during bolus administration of intravenous contrast.  Multiplanar CT image reconstructions including MIPs were obtained to evaluate the vascular anatomy.  Contrast: OMNIPAQUE IOHEXOL 300 MG/ML IV SOLN  Comparison:  None  Findings:  No enlarged axillary or supraclavicular lymph nodes. Low density nodule arising from the isthmus of the thyroid gland measures 1.9 cm.  No enlarged mediastinal or hilar lymph nodes.  No pericardial effusions.  Small left pleural effusion is identified.  Pulmonary arteries are increased in caliber.  The main pulmonary artery measures 3.3 cm.  No abnormal filling defects are identified within the main pulmonary artery or its branches to suggest an  acute pulmonary embolus.  Atelectasis is identified within both lung bases.  Mild interlobular septal thickening is noted suggesting mild interstitial edema.  No airspace consolidation identified.  Chronic bronchial wall thickening is noted bilaterally.  There is mild bronchiectasis in the left base.  Faint nodule in the right middle lobe measures 5.3 mm, image 67.  Left upper lobe nodule is identified measuring 5.1 mm, image 23.  Within the left upper lobe there is a tiny nodule measuring 3 mm.  The trachea appears patent and is midline.  There is a scoliosis deformity involving the thoracic spine with multilevel degenerative disc disease.  Age indeterminate compression deformity is noted within the lower thoracic spine with loss of approximately 50% of the vertebral body height.  Review of the MIP images confirms the above findings.  IMPRESSION:  1.  No evidence for acute pulmonary embolus.  2.  Suspect mild CHF. 3.  Small nonspecific pulmonary nodules which measure up to 5.1 mm. If the patient is at high risk for bronchogenic carcinoma, follow- up chest CT at 6-12 months is recommended.  If the patient is at low risk for bronchogenic carcinoma, follow-up chest CT at 12 months is recommended.  This recommendation follows the consensus statement: Guidelines for Management of Small Pulmonary Nodules Detected on CT Scans: A Statement from the Fleischner Society as published in Radiology 2005; 237:395-400. Online at: DietDisorder.cz.  4.  Scoliosis and spondylosis within the thoracic spine.  There is an age indeterminant  the lower thoracic spine compression fracture. Per CMS PQRS reporting requirements (PQRS Measure 24): Given the patient's age of greater than 50 and the fracture site (hip, distal radius, or spine), the patient should be tested for osteoporosis using DXA, and the appropriate treatment considered based on the DXA results.  Original Report Authenticated By: Rosealee Albee, M.D.   Nm Gi Blood Loss  12/03/2011  *RADIOLOGY REPORT*  Clinical Data: GI bleeding.  Dark red stools for several days. Status post colonoscopy.  NUCLEAR MEDICINE GASTROINTESTINAL BLEEDING STUDY  Technique:  Sequential abdominal images were obtained following intravenous administration of Tc-40m labeled red blood cells.  Radiopharmaceutical: 20.9 mCi of technetium 34m pertechnetate labeled RBCs.  Comparison: CT 11/30/2011  Findings: Following injection, radiotracer is identified in the right upper quadrant at 30 minutes.  This is persistent and does show antegrade flow during the second hour, suggesting activity within the hepatic flexure of the colon.  No other sites of activity are identified.  IMPRESSION:  1.  Positive nuclear medicine bleeding scan. 2.  Point of bleeding is suspected to be within the hepatic flexure of the colon.  Original Report Authenticated By: Patterson Hammersmith, M.D.   US Abdomen Complete  11/28/2011  *RADIOLOGY REPORT*  Clinical Data:  Nausea, back pain, and belching. Abnormal liver function tests.  ABDOMINAL ULTRASOUND COMPLETE  Comparison:  MRI abdomen 07/24/2007 and CT abdomen pelvis 06/06/2007  Findings:  Gallbladder:  No gallstones, gallbladder wall thickening, or pericholecystic fluid.  Common Bile Duct:  Within normal limits in caliber. Measures 3.8 mm.  Liver: No focal mass lesion identified.  Within normal limits in parenchymal echogenicity. A few small cysts are seen, the largest measuring 1.2 x 0.8 x 1.0 cm.  IVC:  Appears normal.  Pancreas:  Although the pancreas is difficult to visualize in its entirety, no focal pancreatic abnormality is identified. Marland Kitchen  Spleen:  Within normal limits in size and echotexture.  Right kidney:  Normal in size and parenchymal echogenicity. No evidence of mass or hydronephrosis.  Left kidney:  Normal in size and parenchymal echogenicity. A duplicated left renal collecting system, a normal variant is noted. No evidence of mass or  hydronephrosis.  Abdominal Aorta:  The abdominal aorta is not adequately visualized, due to overlying bowel gas.  IMPRESSION:  1. No acute findings in the abdomen.  No definite explanation for elevated liver function tests. 2.  A few small hepatic cysts, as seen on prior abdominal imaging.  Original Report Authenticated By: Britta Mccreedy, M.D.   Ct Abdomen Pelvis W Contrast  11/28/2011  *RADIOLOGY REPORT*  Clinical Data: Abdominal pain, mid back pain.  CT ABDOMEN AND PELVIS WITH CONTRAST  Technique:  Multidetector CT imaging of the abdomen and pelvis was performed following the standard protocol during bolus administration of intravenous contrast.  Contrast: OMNIPAQUE IOHEXOL 300 MG/ML IV SOLN  Comparison: 06/06/2007.  Findings: Scarring in the lung bases.  Heart is mildly enlarged.  Scattered low density areas in the liver, stable since prior study compatible with cysts.  Gallbladder, spleen, pancreas, adrenals are unremarkable.  Kidneys enhance symmetrically.  No focal abnormality or hydronephrosis.  Large and small bowel are grossly unremarkable.  No free fluid, free air or adenopathy.  Uterus and adnexa as well as urinary bladder are grossly unremarkable.  Severe leftward scoliosis and degenerative changes.  Severe tortuosity in the aorta and iliac vessels.  No aneurysm.  No acute bony abnormality.  IMPRESSION: No acute findings in the abdomen or pelvis.  Original Report Authenticated By: Cyndie Chime, M.D.   Ir Angiogram Visceral Selective  12/03/2011  *RADIOLOGY REPORT*  Indication: Acute lower GI bleed.  MESENTERIC ARTERIOGRAM INCLUDING CELIAC AND SMA ARTERIOGRAM (1st order) AND SUBSELECTIVE ARTERIOGRAMS OF MULTIPLE BRANCH VESSELS OF THE SMA (3rd ORDER) ULTRASOUND GUIDANCE FOR ARTERIAL ACCESS  Comparisons: Tagged red blood cell scan - earlier same day; abdominal CT - 11/30/2011; 11/28/2011  Intravenous Medications: Fentanyl 150 mcg IV; Versed 3 mg IV; Nitroglycerin 200 mcg IA  Contrast: 135 ml  Omnipaque-300  Total Moderate Sedation Time: 120 minutes  Fluoroscopy Time: 22.7 minutes.  Complications: None immediate  Technique / Findings:  Informed written consent was obtained from the patient after a discussion of the risks, benefits and alternatives to treatment. Questions regarding the procedure were encouraged and answered.  A timeout was performed prior to the initiation of the procedure.  The right groin was prepped and draped in the usual sterile fashion, and a sterile drape was applied covering the operative field.  Maximum barrier sterile technique with sterile gowns and gloves were used for the procedure.  A timeout was performed prior to the initiation of the procedure.  Local anesthesia was provided with 1% lidocaine.  The right femoral head was marked fluoroscopically.  Under ultrasound guidance, the right common femoral artery was accessed with a micropuncture kit after the overlying soft tissues were anesthetized with 1% lidocaine.  An ultrasound image was saved for documentation purposes.  The micropuncture sheath was exchanged for a 5 [place] vascular sheath over a Bentson wire.  A closure arteriogram was performed through the side of the sheath confirming access within the right common femoral artery.  Over a Bentson wire, a Mickelson catheter was advanced to the level of the thoracic aorta where it was back bled and flushed.  The catheter was then utilized to select the celiac artery and a celiac arteriogram was performed.  The Mickelson catheter was then utilized to select the SMA and a superior mesenteric arteriogram was performed.  A small area of active extravasation was noted from a distal branch vessel of the right colic within the right upper abdominal quadrant correlating with the area of active GI bleed seen on the preprocedural tag red blood cell study.  As such, with the use of a Fathom 14 and a Synchro 14 microwire, a regular microcatheter was utilized to select the right colic  and a subselective arteriogram was performed of this vessel, however failed to demonstrate active bleeding.  The regular renegade microcatheter was then utilized to select the middle colic, the ileocolic and the distal most aspect of the SMA supplying the right hemiabdomen, however despite subselective arteriograms of these vessels, the area of active extravasation was not identified.  The microcatheter was then utilized to select the right colic, the favored branch vessel of the SMA likely supplying the initially seen area of active bleeding, and advanced further into the arterial bed however despite the administration of a total of 200 mcg of Nitroglycerin intra-arterially and multiple distal subselective arteriograms, the area of bleeding was still not seen.  The microcatheter was removed and a repeat superior mesenteric arteriogram was performed which was negative for persistent area of active extravasation or vessel irregularity.  As such, the procedure was terminated.  All wires, catheters and sheaths were removed from the patient. Hemostasis was achieved at the right groin access site with manual compression.  A dressing was placed.  The patient tolerated procedure well without immediate postprocedural complication.  Impression:  An area of active extravasation was identified on the initial superior mesenteric arteriogram within the right upper abdominal quadrant, correlating with the findings seen on preprocedural tagged red blood cell scan.  Despite subselective injections of multiple branch vessels of the SMA, including subselective arteriograms and the administration of intra-arterial Nitroglycerin into the distal branch vessels of the right colic (the presumed source of the bleed), the area of active extravasation was not seen after the initial superior mesenteric arteriogram.  As such, no embolization was performed.  Original Report Authenticated By: Waynard Reeds, M.D.   Ir Angiogram Visceral  Selective  12/03/2011  *RADIOLOGY REPORT*  Indication: Acute lower GI bleed.  MESENTERIC ARTERIOGRAM INCLUDING CELIAC AND SMA ARTERIOGRAM (1st order) AND SUBSELECTIVE ARTERIOGRAMS OF MULTIPLE BRANCH VESSELS OF THE SMA (3rd ORDER) ULTRASOUND GUIDANCE FOR ARTERIAL ACCESS  Comparisons: Tagged red blood cell scan - earlier same day; abdominal CT - 11/30/2011; 11/28/2011  Intravenous Medications: Fentanyl 150 mcg IV; Versed 3 mg IV; Nitroglycerin 200 mcg IA  Contrast: 135 ml Omnipaque-300  Total Moderate Sedation Time: 120 minutes  Fluoroscopy Time: 22.7 minutes.  Complications: None immediate  Technique / Findings:  Informed written consent was obtained from the patient after a discussion of the risks, benefits and alternatives to treatment. Questions regarding the procedure were encouraged and answered.  A timeout was performed prior to the initiation of the procedure.  The right groin was prepped and draped in the usual sterile fashion, and a sterile drape was applied covering the operative field.  Maximum barrier sterile technique with sterile gowns and gloves were used for the procedure.  A timeout was performed prior to the initiation of the procedure.  Local anesthesia was provided with 1% lidocaine.  The right femoral head was marked fluoroscopically.  Under ultrasound guidance, the right common femoral artery was accessed with a micropuncture kit after the overlying soft tissues were anesthetized with 1% lidocaine.  An ultrasound image was saved for documentation purposes.  The micropuncture sheath was exchanged for a 5 [place] vascular sheath over a Bentson wire.  A closure arteriogram was performed through the side of the sheath confirming access within the right common femoral artery.  Over a Bentson wire, a Mickelson catheter was advanced to the level of the thoracic aorta where it was back bled and flushed.  The catheter was then utilized to select the celiac artery and a celiac arteriogram was performed.   The Mickelson catheter was then utilized to select the SMA and a superior mesenteric arteriogram was performed.  A small area of active extravasation was noted from a distal branch vessel of the right colic within the right upper abdominal quadrant correlating with the area of active GI bleed seen on the preprocedural tag red blood cell study.  As such, with the use of a Fathom 14 and a Synchro 14 microwire, a regular microcatheter was utilized to select the right colic and a subselective arteriogram was performed of this vessel, however failed to demonstrate active bleeding.  The regular renegade microcatheter was then utilized to select the middle colic, the ileocolic and the distal most aspect of the SMA supplying the right hemiabdomen, however despite subselective arteriograms of these vessels, the area of active extravasation was not identified.  The microcatheter was then utilized to select the right colic, the favored branch vessel of the SMA likely supplying the initially seen area of active bleeding, and advanced further into the arterial bed however despite the administration of a total of 200  mcg of Nitroglycerin intra-arterially and multiple distal subselective arteriograms, the area of bleeding was still not seen.  The microcatheter was removed and a repeat superior mesenteric arteriogram was performed which was negative for persistent area of active extravasation or vessel irregularity.  As such, the procedure was terminated.  All wires, catheters and sheaths were removed from the patient. Hemostasis was achieved at the right groin access site with manual compression.  A dressing was placed.  The patient tolerated procedure well without immediate postprocedural complication.  Impression:  An area of active extravasation was identified on the initial superior mesenteric arteriogram within the right upper abdominal quadrant, correlating with the findings seen on preprocedural tagged red blood cell scan.   Despite subselective injections of multiple branch vessels of the SMA, including subselective arteriograms and the administration of intra-arterial Nitroglycerin into the distal branch vessels of the right colic (the presumed source of the bleed), the area of active extravasation was not seen after the initial superior mesenteric arteriogram.  As such, no embolization was performed.  Original Report Authenticated By: Waynard Reeds, M.D.   Ir Angiogram Selective Each Additional Vessel  12/03/2011  *RADIOLOGY REPORT*  Indication: Acute lower GI bleed.  MESENTERIC ARTERIOGRAM INCLUDING CELIAC AND SMA ARTERIOGRAM (1st order) AND SUBSELECTIVE ARTERIOGRAMS OF MULTIPLE BRANCH VESSELS OF THE SMA (3rd ORDER) ULTRASOUND GUIDANCE FOR ARTERIAL ACCESS  Comparisons: Tagged red blood cell scan - earlier same day; abdominal CT - 11/30/2011; 11/28/2011  Intravenous Medications: Fentanyl 150 mcg IV; Versed 3 mg IV; Nitroglycerin 200 mcg IA  Contrast: 135 ml Omnipaque-300  Total Moderate Sedation Time: 120 minutes  Fluoroscopy Time: 22.7 minutes.  Complications: None immediate  Technique / Findings:  Informed written consent was obtained from the patient after a discussion of the risks, benefits and alternatives to treatment. Questions regarding the procedure were encouraged and answered.  A timeout was performed prior to the initiation of the procedure.  The right groin was prepped and draped in the usual sterile fashion, and a sterile drape was applied covering the operative field.  Maximum barrier sterile technique with sterile gowns and gloves were used for the procedure.  A timeout was performed prior to the initiation of the procedure.  Local anesthesia was provided with 1% lidocaine.  The right femoral head was marked fluoroscopically.  Under ultrasound guidance, the right common femoral artery was accessed with a micropuncture kit after the overlying soft tissues were anesthetized with 1% lidocaine.  An ultrasound image  was saved for documentation purposes.  The micropuncture sheath was exchanged for a 5 [place] vascular sheath over a Bentson wire.  A closure arteriogram was performed through the side of the sheath confirming access within the right common femoral artery.  Over a Bentson wire, a Mickelson catheter was advanced to the level of the thoracic aorta where it was back bled and flushed.  The catheter was then utilized to select the celiac artery and a celiac arteriogram was performed.  The Mickelson catheter was then utilized to select the SMA and a superior mesenteric arteriogram was performed.  A small area of active extravasation was noted from a distal branch vessel of the right colic within the right upper abdominal quadrant correlating with the area of active GI bleed seen on the preprocedural tag red blood cell study.  As such, with the use of a Fathom 14 and a Synchro 14 microwire, a regular microcatheter was utilized to select the right colic and a subselective arteriogram was performed of this  vessel, however failed to demonstrate active bleeding.  The regular renegade microcatheter was then utilized to select the middle colic, the ileocolic and the distal most aspect of the SMA supplying the right hemiabdomen, however despite subselective arteriograms of these vessels, the area of active extravasation was not identified.  The microcatheter was then utilized to select the right colic, the favored branch vessel of the SMA likely supplying the initially seen area of active bleeding, and advanced further into the arterial bed however despite the administration of a total of 200 mcg of Nitroglycerin intra-arterially and multiple distal subselective arteriograms, the area of bleeding was still not seen.  The microcatheter was removed and a repeat superior mesenteric arteriogram was performed which was negative for persistent area of active extravasation or vessel irregularity.  As such, the procedure was terminated.   All wires, catheters and sheaths were removed from the patient. Hemostasis was achieved at the right groin access site with manual compression.  A dressing was placed.  The patient tolerated procedure well without immediate postprocedural complication.  Impression:  An area of active extravasation was identified on the initial superior mesenteric arteriogram within the right upper abdominal quadrant, correlating with the findings seen on preprocedural tagged red blood cell scan.  Despite subselective injections of multiple branch vessels of the SMA, including subselective arteriograms and the administration of intra-arterial Nitroglycerin into the distal branch vessels of the right colic (the presumed source of the bleed), the area of active extravasation was not seen after the initial superior mesenteric arteriogram.  As such, no embolization was performed.  Original Report Authenticated By: Waynard Reeds, M.D.   Ir Angiogram Selective Each Additional Vessel  12/03/2011  *RADIOLOGY REPORT*  Indication: Acute lower GI bleed.  MESENTERIC ARTERIOGRAM INCLUDING CELIAC AND SMA ARTERIOGRAM (1st order) AND SUBSELECTIVE ARTERIOGRAMS OF MULTIPLE BRANCH VESSELS OF THE SMA (3rd ORDER) ULTRASOUND GUIDANCE FOR ARTERIAL ACCESS  Comparisons: Tagged red blood cell scan - earlier same day; abdominal CT - 11/30/2011; 11/28/2011  Intravenous Medications: Fentanyl 150 mcg IV; Versed 3 mg IV; Nitroglycerin 200 mcg IA  Contrast: 135 ml Omnipaque-300  Total Moderate Sedation Time: 120 minutes  Fluoroscopy Time: 22.7 minutes.  Complications: None immediate  Technique / Findings:  Informed written consent was obtained from the patient after a discussion of the risks, benefits and alternatives to treatment. Questions regarding the procedure were encouraged and answered.  A timeout was performed prior to the initiation of the procedure.  The right groin was prepped and draped in the usual sterile fashion, and a sterile drape was  applied covering the operative field.  Maximum barrier sterile technique with sterile gowns and gloves were used for the procedure.  A timeout was performed prior to the initiation of the procedure.  Local anesthesia was provided with 1% lidocaine.  The right femoral head was marked fluoroscopically.  Under ultrasound guidance, the right common femoral artery was accessed with a micropuncture kit after the overlying soft tissues were anesthetized with 1% lidocaine.  An ultrasound image was saved for documentation purposes.  The micropuncture sheath was exchanged for a 5 [place] vascular sheath over a Bentson wire.  A closure arteriogram was performed through the side of the sheath confirming access within the right common femoral artery.  Over a Bentson wire, a Mickelson catheter was advanced to the level of the thoracic aorta where it was back bled and flushed.  The catheter was then utilized to select the celiac artery and a celiac arteriogram was performed.  The Mickelson catheter was then utilized to select the SMA and a superior mesenteric arteriogram was performed.  A small area of active extravasation was noted from a distal branch vessel of the right colic within the right upper abdominal quadrant correlating with the area of active GI bleed seen on the preprocedural tag red blood cell study.  As such, with the use of a Fathom 14 and a Synchro 14 microwire, a regular microcatheter was utilized to select the right colic and a subselective arteriogram was performed of this vessel, however failed to demonstrate active bleeding.  The regular renegade microcatheter was then utilized to select the middle colic, the ileocolic and the distal most aspect of the SMA supplying the right hemiabdomen, however despite subselective arteriograms of these vessels, the area of active extravasation was not identified.  The microcatheter was then utilized to select the right colic, the favored branch vessel of the SMA likely  supplying the initially seen area of active bleeding, and advanced further into the arterial bed however despite the administration of a total of 200 mcg of Nitroglycerin intra-arterially and multiple distal subselective arteriograms, the area of bleeding was still not seen.  The microcatheter was removed and a repeat superior mesenteric arteriogram was performed which was negative for persistent area of active extravasation or vessel irregularity.  As such, the procedure was terminated.  All wires, catheters and sheaths were removed from the patient. Hemostasis was achieved at the right groin access site with manual compression.  A dressing was placed.  The patient tolerated procedure well without immediate postprocedural complication.  Impression:  An area of active extravasation was identified on the initial superior mesenteric arteriogram within the right upper abdominal quadrant, correlating with the findings seen on preprocedural tagged red blood cell scan.  Despite subselective injections of multiple branch vessels of the SMA, including subselective arteriograms and the administration of intra-arterial Nitroglycerin into the distal branch vessels of the right colic (the presumed source of the bleed), the area of active extravasation was not seen after the initial superior mesenteric arteriogram.  As such, no embolization was performed.  Original Report Authenticated By: Waynard Reeds, M.D.   Ir Angiogram Selective Each Additional Vessel  12/03/2011  *RADIOLOGY REPORT*  Indication: Acute lower GI bleed.  MESENTERIC ARTERIOGRAM INCLUDING CELIAC AND SMA ARTERIOGRAM (1st order) AND SUBSELECTIVE ARTERIOGRAMS OF MULTIPLE BRANCH VESSELS OF THE SMA (3rd ORDER) ULTRASOUND GUIDANCE FOR ARTERIAL ACCESS  Comparisons: Tagged red blood cell scan - earlier same day; abdominal CT - 11/30/2011; 11/28/2011  Intravenous Medications: Fentanyl 150 mcg IV; Versed 3 mg IV; Nitroglycerin 200 mcg IA  Contrast: 135 ml  Omnipaque-300  Total Moderate Sedation Time: 120 minutes  Fluoroscopy Time: 22.7 minutes.  Complications: None immediate  Technique / Findings:  Informed written consent was obtained from the patient after a discussion of the risks, benefits and alternatives to treatment. Questions regarding the procedure were encouraged and answered.  A timeout was performed prior to the initiation of the procedure.  The right groin was prepped and draped in the usual sterile fashion, and a sterile drape was applied covering the operative field.  Maximum barrier sterile technique with sterile gowns and gloves were used for the procedure.  A timeout was performed prior to the initiation of the procedure.  Local anesthesia was provided with 1% lidocaine.  The right femoral head was marked fluoroscopically.  Under ultrasound guidance, the right common femoral artery was accessed with a micropuncture kit after the overlying soft tissues  were anesthetized with 1% lidocaine.  An ultrasound image was saved for documentation purposes.  The micropuncture sheath was exchanged for a 5 [place] vascular sheath over a Bentson wire.  A closure arteriogram was performed through the side of the sheath confirming access within the right common femoral artery.  Over a Bentson wire, a Mickelson catheter was advanced to the level of the thoracic aorta where it was back bled and flushed.  The catheter was then utilized to select the celiac artery and a celiac arteriogram was performed.  The Mickelson catheter was then utilized to select the SMA and a superior mesenteric arteriogram was performed.  A small area of active extravasation was noted from a distal branch vessel of the right colic within the right upper abdominal quadrant correlating with the area of active GI bleed seen on the preprocedural tag red blood cell study.  As such, with the use of a Fathom 14 and a Synchro 14 microwire, a regular microcatheter was utilized to select the right colic  and a subselective arteriogram was performed of this vessel, however failed to demonstrate active bleeding.  The regular renegade microcatheter was then utilized to select the middle colic, the ileocolic and the distal most aspect of the SMA supplying the right hemiabdomen, however despite subselective arteriograms of these vessels, the area of active extravasation was not identified.  The microcatheter was then utilized to select the right colic, the favored branch vessel of the SMA likely supplying the initially seen area of active bleeding, and advanced further into the arterial bed however despite the administration of a total of 200 mcg of Nitroglycerin intra-arterially and multiple distal subselective arteriograms, the area of bleeding was still not seen.  The microcatheter was removed and a repeat superior mesenteric arteriogram was performed which was negative for persistent area of active extravasation or vessel irregularity.  As such, the procedure was terminated.  All wires, catheters and sheaths were removed from the patient. Hemostasis was achieved at the right groin access site with manual compression.  A dressing was placed.  The patient tolerated procedure well without immediate postprocedural complication.  Impression:  An area of active extravasation was identified on the initial superior mesenteric arteriogram within the right upper abdominal quadrant, correlating with the findings seen on preprocedural tagged red blood cell scan.  Despite subselective injections of multiple branch vessels of the SMA, including subselective arteriograms and the administration of intra-arterial Nitroglycerin into the distal branch vessels of the right colic (the presumed source of the bleed), the area of active extravasation was not seen after the initial superior mesenteric arteriogram.  As such, no embolization was performed.  Original Report Authenticated By: Waynard Reeds, M.D.   Ir Angiogram Selective Each  Additional Vessel  12/03/2011  *RADIOLOGY REPORT*  Indication: Acute lower GI bleed.  MESENTERIC ARTERIOGRAM INCLUDING CELIAC AND SMA ARTERIOGRAM (1st order) AND SUBSELECTIVE ARTERIOGRAMS OF MULTIPLE BRANCH VESSELS OF THE SMA (3rd ORDER) ULTRASOUND GUIDANCE FOR ARTERIAL ACCESS  Comparisons: Tagged red blood cell scan - earlier same day; abdominal CT - 11/30/2011; 11/28/2011  Intravenous Medications: Fentanyl 150 mcg IV; Versed 3 mg IV; Nitroglycerin 200 mcg IA  Contrast: 135 ml Omnipaque-300  Total Moderate Sedation Time: 120 minutes  Fluoroscopy Time: 22.7 minutes.  Complications: None immediate  Technique / Findings:  Informed written consent was obtained from the patient after a discussion of the risks, benefits and alternatives to treatment. Questions regarding the procedure were encouraged and answered.  A timeout was performed prior  to the initiation of the procedure.  The right groin was prepped and draped in the usual sterile fashion, and a sterile drape was applied covering the operative field.  Maximum barrier sterile technique with sterile gowns and gloves were used for the procedure.  A timeout was performed prior to the initiation of the procedure.  Local anesthesia was provided with 1% lidocaine.  The right femoral head was marked fluoroscopically.  Under ultrasound guidance, the right common femoral artery was accessed with a micropuncture kit after the overlying soft tissues were anesthetized with 1% lidocaine.  An ultrasound image was saved for documentation purposes.  The micropuncture sheath was exchanged for a 5 [place] vascular sheath over a Bentson wire.  A closure arteriogram was performed through the side of the sheath confirming access within the right common femoral artery.  Over a Bentson wire, a Mickelson catheter was advanced to the level of the thoracic aorta where it was back bled and flushed.  The catheter was then utilized to select the celiac artery and a celiac arteriogram was  performed.  The Mickelson catheter was then utilized to select the SMA and a superior mesenteric arteriogram was performed.  A small area of active extravasation was noted from a distal branch vessel of the right colic within the right upper abdominal quadrant correlating with the area of active GI bleed seen on the preprocedural tag red blood cell study.  As such, with the use of a Fathom 14 and a Synchro 14 microwire, a regular microcatheter was utilized to select the right colic and a subselective arteriogram was performed of this vessel, however failed to demonstrate active bleeding.  The regular renegade microcatheter was then utilized to select the middle colic, the ileocolic and the distal most aspect of the SMA supplying the right hemiabdomen, however despite subselective arteriograms of these vessels, the area of active extravasation was not identified.  The microcatheter was then utilized to select the right colic, the favored branch vessel of the SMA likely supplying the initially seen area of active bleeding, and advanced further into the arterial bed however despite the administration of a total of 200 mcg of Nitroglycerin intra-arterially and multiple distal subselective arteriograms, the area of bleeding was still not seen.  The microcatheter was removed and a repeat superior mesenteric arteriogram was performed which was negative for persistent area of active extravasation or vessel irregularity.  As such, the procedure was terminated.  All wires, catheters and sheaths were removed from the patient. Hemostasis was achieved at the right groin access site with manual compression.  A dressing was placed.  The patient tolerated procedure well without immediate postprocedural complication.  Impression:  An area of active extravasation was identified on the initial superior mesenteric arteriogram within the right upper abdominal quadrant, correlating with the findings seen on preprocedural tagged red blood  cell scan.  Despite subselective injections of multiple branch vessels of the SMA, including subselective arteriograms and the administration of intra-arterial Nitroglycerin into the distal branch vessels of the right colic (the presumed source of the bleed), the area of active extravasation was not seen after the initial superior mesenteric arteriogram.  As such, no embolization was performed.  Original Report Authenticated By: Waynard Reeds, M.D.   Mr Angiogram Abdomen W Wo Contrast  12/04/2011  *RADIOLOGY REPORT*  Clinical Data:  GI bleed with abnormal bleeding scan and arteriography.  Arteriography initially demonstrated evidence of active bleeding in the distal superior mesenteric artery supply at the level of  the proximal colon/distal small bowel.  MRA ABDOMEN WITH AND WITHOUT CONTRAST  Technique:  Angiographic images of the abdomen were obtained using MRA technique with intravenous contrast.  Contrast:  17mL MULTIHANCE GADOBENATE DIMEGLUMINE 529 MG/ML IV SOLN  Comparison:  Visceral angiography dated 12/03/2011  Findings:  MRI demonstrates a tortuous aorta showing no evidence of aneurysmal disease or stenosis.  Proximal visceral arteries are normally patent including the celiac axis, superior mesenteric artery and inferior mesenteric artery.  Bilateral single renal arteries are normally patent.  The visualized iliac arteries are tortuous and show no evidence of significant disease.  Postcontrast imaging shows no evidence of abnormal enhancement in the region of arteriographic abnormality.  MRA acquisition is not sensitive in depicting smaller caliber vessels at this level of the bowel.  No incidental masses are identified.  Some scattered cysts are present in the left lobe of the liver as depicted by prior CT. Normal small calcified splenic artery aneurysm seen by CT is not well depicted by MRA.  IMPRESSION: Unremarkable MRA demonstrating no evidence of abnormal enhancement or lesion in the region of  arteriographic abnormality.  Original Report Authenticated By: Reola Calkins, M.D.   Nm Hepato W/eject Fract  12/01/2011  *RADIOLOGY REPORT*  Clinical Data: Right upper quadrant pain and mid back pain.  NUCLEAR MEDICINE HEPATOBILIARY WITH GB, PHARM AND QUAN MEASURE  Radiopharmaceutical: 5.1 mCi technetium 50m Choletec IV  1.6 mcg CCK IV over 30 minutes.  Comparison: Abdominal ultrasound dated 11/28/2011  Findings: Bile ducts are visible at 15 minutes.  Gallbladder is visible 25 minutes.  Activity is present in the bowel at 15 minutes.  After CCK injection an ejection fraction was calculated at 81.4% at 30 minutes.  The patient did have pain with CCK injection.  IMPRESSION: Normal hepatobiliary scan with normal ejection fraction of 81.4%. The patient had pain with CCK injection.  Original Report Authenticated By: Gwynn Burly, M.D.   Ir US Guide Vasc Access Right  12/03/2011  *RADIOLOGY REPORT*  Indication: Acute lower GI bleed.  MESENTERIC ARTERIOGRAM INCLUDING CELIAC AND SMA ARTERIOGRAM (1st order) AND SUBSELECTIVE ARTERIOGRAMS OF MULTIPLE BRANCH VESSELS OF THE SMA (3rd ORDER) ULTRASOUND GUIDANCE FOR ARTERIAL ACCESS  Comparisons: Tagged red blood cell scan - earlier same day; abdominal CT - 11/30/2011; 11/28/2011  Intravenous Medications: Fentanyl 150 mcg IV; Versed 3 mg IV; Nitroglycerin 200 mcg IA  Contrast: 135 ml Omnipaque-300  Total Moderate Sedation Time: 120 minutes  Fluoroscopy Time: 22.7 minutes.  Complications: None immediate  Technique / Findings:  Informed written consent was obtained from the patient after a discussion of the risks, benefits and alternatives to treatment. Questions regarding the procedure were encouraged and answered.  A timeout was performed prior to the initiation of the procedure.  The right groin was prepped and draped in the usual sterile fashion, and a sterile drape was applied covering the operative field.  Maximum barrier sterile technique with sterile gowns and  gloves were used for the procedure.  A timeout was performed prior to the initiation of the procedure.  Local anesthesia was provided with 1% lidocaine.  The right femoral head was marked fluoroscopically.  Under ultrasound guidance, the right common femoral artery was accessed with a micropuncture kit after the overlying soft tissues were anesthetized with 1% lidocaine.  An ultrasound image was saved for documentation purposes.  The micropuncture sheath was exchanged for a 5 [place] vascular sheath over a Bentson wire.  A closure arteriogram was performed through the side of the  sheath confirming access within the right common femoral artery.  Over a Bentson wire, a Mickelson catheter was advanced to the level of the thoracic aorta where it was back bled and flushed.  The catheter was then utilized to select the celiac artery and a celiac arteriogram was performed.  The Mickelson catheter was then utilized to select the SMA and a superior mesenteric arteriogram was performed.  A small area of active extravasation was noted from a distal branch vessel of the right colic within the right upper abdominal quadrant correlating with the area of active GI bleed seen on the preprocedural tag red blood cell study.  As such, with the use of a Fathom 14 and a Synchro 14 microwire, a regular microcatheter was utilized to select the right colic and a subselective arteriogram was performed of this vessel, however failed to demonstrate active bleeding.  The regular renegade microcatheter was then utilized to select the middle colic, the ileocolic and the distal most aspect of the SMA supplying the right hemiabdomen, however despite subselective arteriograms of these vessels, the area of active extravasation was not identified.  The microcatheter was then utilized to select the right colic, the favored branch vessel of the SMA likely supplying the initially seen area of active bleeding, and advanced further into the arterial bed  however despite the administration of a total of 200 mcg of Nitroglycerin intra-arterially and multiple distal subselective arteriograms, the area of bleeding was still not seen.  The microcatheter was removed and a repeat superior mesenteric arteriogram was performed which was negative for persistent area of active extravasation or vessel irregularity.  As such, the procedure was terminated.  All wires, catheters and sheaths were removed from the patient. Hemostasis was achieved at the right groin access site with manual compression.  A dressing was placed.  The patient tolerated procedure well without immediate postprocedural complication.  Impression:  An area of active extravasation was identified on the initial superior mesenteric arteriogram within the right upper abdominal quadrant, correlating with the findings seen on preprocedural tagged red blood cell scan.  Despite subselective injections of multiple branch vessels of the SMA, including subselective arteriograms and the administration of intra-arterial Nitroglycerin into the distal branch vessels of the right colic (the presumed source of the bleed), the area of active extravasation was not seen after the initial superior mesenteric arteriogram.  As such, no embolization was performed.  Original Report Authenticated By: Waynard Reeds, M.D.   Ct Entero Abd/pelvis W/cm  11/30/2011  *RADIOLOGY REPORT*  Clinical Data:  Abdominal pain with occult bleeding.  CT ABDOMEN AND PELVIS WITH CONTRAST (CT ENTEROGRAPHY)  Technique:  Multidetector CT of the abdomen and pelvis during bolus administration of intravenous contrast. Negative oral contrast VoLumen was given.  Contrast: OMNIPAQUE IOHEXOL 300 MG/ML IV SOLN  Comparison:  11/28/2011  Findings: No abnormal mural enhancement in the stomach.  No abnormal mucosal or transmural enhancement in the small bowel.  The colon cannot be evaluated secondary to the presence of positive contrast agent within the  colonic lumen to residual from the CT scan of 2 days ago.  No abnormal small bowel wall thickening.  No small bowel mass lesion is evident.  Low density lesions in the left liver were present on a CT scan from 2008.  Spleen is unremarkable.  The patient does have a 12 mm splenic artery aneurysm in the region of the hilum.  The pancreas, gallbladder, and adrenal glands are unremarkable.  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  Right kidney is normal.  No abdominal aortic aneurysm.  There is no evidence for free fluid or lymphadenopathy in the abdomen.  Imaging through the pelvis shows no free fluid.  Bladder is unremarkable.  Uterus is normal.  There is no adnexal mass.  No colonic diverticulitis.  The terminal ileum is normal. The appendix is not visualized, but there is no edema or inflammation in the region of the cecum.  Thoracolumbar scoliosis is evident with degenerative changes in the lumbar spine.  IMPRESSION: No CT findings to explain the patient's history of occult GI bleeding.  Splenic artery aneurysm.  Original Report Authenticated By: ERIC A. MANSELL, M.D.    GI Procedures:  EGD Arlyce Dice) - normal Colonoscopy Arlyce Dice) - blood throughout colon and terminal ileum.   History/Physical Exam:  See Admission H&P  Admission HPI: DELOROS BERETTA is a 77 y.o. female who is known to Dr. Leone Payor with history of chronic atrial fibrillation, generally maintained on Coumadin. She was hospitalized in June of 2012 with a GI bleed. At that time she underwent upper endoscopy which was normal and then colonoscopy which was pertinent only for mild diverticulosis in the sigmoid colon. She also underwent a capsule endoscopy which was negative. She had not had any recurrent bleeding since and had resumed her Coumadin a couple of weeks later. She was seen last week in the office twice after having recurrent low-grade bleeding with dark  stools with pinkish blood noted in the commode onset last Tuesday. Complained of feeling a bit fatigued but had no other symptoms other than some vague gassiness. She had not been on any aspirin or NSAIDs. Her Coumadin was held when she was seen in the office on 11/22/2011. That time her hemoglobin was 13 and her INR was 2.9. She did not want to be hospitalized. We follow her hemoglobin stent daily which remained very stable in the 12 range. She was seen again on Friday 12 7 still with very dark maroonish stool which is 1-2 bowel movements per day and her hemoglobin was stable at 12.1. She did complain of a "gas" attack which had subsided when she was seen in the office. She was asked to come back today for repeat CBC. She walked up to the third floor because she says she's been having horrible gas all weekend. She has vague nausea but has not vomited. Has had no fever or chills. She continues to have very dark stools with some reddish tinge. She denies any real abdominal pain but says she has been having "wave-like" pain in her mid back which did radiate around into her right upper quadrant yesterday. She feels that her gas and belching it is aggravated by movement and by eating. She is has no heartburn or dysphagia or odynophagia. She is tired and says she hasn't slept all well all weekend and has been belching repeatedly.   Hospital Course by problem list: Once admitted patient continued to have intermittent GI bleeding, belching and back pain.. It wasn't clear if patient's symptoms were related to the bleeding. For evaluation she had numerous studies including an U/S (normal gallbladder), HIDA (normal), CTangiogram of the chest (normal), EGD (normal), a capsule endoscopy (normal except for some ? melenotic stool in area of cecum), and a CT enterography (normal).  For persistent bleeding patient underwent colonoscopy which did show blood throughout colon and terminal ileum.  An RBC tagged scan  showed activity  in area of hepatic flexure. Patient went for an angiogram but no active bleeding seen so nothing to embolize at the time. Patient ultimately went for another colonoscopy at which time fresh blood was seen throughout the colon. A pinpoint area of oozing was seen in area of hepatic flexure. Hemostasis was achieved with endoclipping. Patient had a small ascending colon polyps that was not removed. Over the following two days patient didn't have any further bleeding but complained of weakness. Her hemoglobin had increased to 10.8 after 2 units of blood but then it subsequently fell to 8.5. Though bleeding had stopped patient was transfused another unit for symptomatic anemia. She was also started on Xifaxan to see if it would help belching / gas. By 12/07/11 patient was feeling better, still no further bleeding and her hemoglobin was up to 9.9. Patient was discharged home with appointment to follow up with Dr. Leone Payor.  Discharge Vitals:  BP 91/55  Pulse 64  Temp(Src) 98 F (36.7 C) (Oral)  Resp 18  Ht 5\' 6"  (1.676 m)  Wt 82.7 kg (182 lb 5.1 oz)  BMI 29.43 kg/m2  SpO2 93%   Discharge Labs:   WBC 4.4  HGB 9.9 (L)  HCT 30.9 (L)  MCV 91.2  MCH 29.2  MCHC 32.0  RDW 16.4 (H)  Platelets 132 (L)   Disposition and follow-up:   Ms.Emalina S Taylor was discharged from [facility] in stable condition on 11/2011.    Follow-up Appointments: Discharge Orders    Future Appointments: Provider: Department: Dept Phone: Center:   12/22/2011 3:00 PM [facility] Office [phone] [facility]   12/26/2011 4:00 PM Iva Boop, MD [facility] [phone] [facility]      Discharge Medications: Discharge Medication List as of 12/07/2011  2:02 PM    START taking these medications   Details  rifaximin (XIFAXAN) 550 MG TABS Take 1 tablet (550 mg total) by mouth 3 (three) times daily., Starting 12/07/2011, Until Sun 12/17/11, Print      CONTINUE these medications which have  CHANGED   Details  ciprofloxacin (CIPRO) 500 MG tablet Take 1 tablet (500 mg total) by mouth daily. One tablet tablet daily for two days, Starting 12/07/2011, Until Sun 12/17/11, Print      CONTINUE these medications which have NOT CHANGED   Details  Calcium 1200-1000 MG-UNIT CHEW Take 1 by mouth once daily , Until Discontinued, Historical Med    cycloSPORINE (RESTASIS) 0.05 % ophthalmic emulsion Place 1 drop into both eyes 2 (two) times daily.  , Until Discontinued, Historical Med    digoxin (LANOXIN) 0.125 MG tablet Take 125 mcg by mouth daily.  , Until Discontinued, Historical Med    furosemide (LASIX) 20 MG tablet Take 20 mg by mouth daily as needed. FLUID RETENTION, Until Discontinued, Historical Med    lansoprazole (PREVACID) 30 MG capsule TAKE 1 TABLET 30 MINUTES BEFORE BREAKFAST, Normal    Lutein 20 MG TABS Take 1 tablet by mouth daily.  , Until Discontinued, Historical Med    methylcellulose (ARTIFICIAL TEARS) 1 % ophthalmic solution Place 1 drop into both eyes as needed. DRY EYES , Until Discontinued, Historical Med    nebivolol (BYSTOLIC) 5 MG tablet Take 2.5 mg by mouth daily.  , Until Discontinued, Historical Med    Pancrelipase, Lip-Prot-Amyl, (CREON) 24000 UNITS CPEP Take by mouth. Take 1 tablet by mouth three times a day ac meals and snacks , Until Discontinued, Historical Med    valsartan-hydrochlorothiazide (  DIOVAN-HCT) 80-12.5 MG per tablet Take 1 tablet by mouth daily.  , Until Discontinued, Historical Med        Signed: Willette Cluster 12/15/2011, 11:37 AM

## 2011-12-18 ENCOUNTER — Encounter (HOSPITAL_COMMUNITY): Payer: Self-pay | Admitting: *Deleted

## 2011-12-18 ENCOUNTER — Emergency Department (HOSPITAL_COMMUNITY): Payer: Medicare Other

## 2011-12-18 ENCOUNTER — Telehealth: Payer: Self-pay | Admitting: Internal Medicine

## 2011-12-18 ENCOUNTER — Inpatient Hospital Stay (HOSPITAL_COMMUNITY)
Admission: EM | Admit: 2011-12-18 | Discharge: 2011-12-25 | DRG: 176 | Disposition: A | Discharge status: Skilled Nursing Facility | Payer: Medicare Other | Attending: Internal Medicine | Admitting: Internal Medicine

## 2011-12-18 ENCOUNTER — Other Ambulatory Visit: Payer: Self-pay

## 2011-12-18 DIAGNOSIS — Z9889 Other specified postprocedural states: Secondary | ICD-10-CM

## 2011-12-18 DIAGNOSIS — K589 Irritable bowel syndrome without diarrhea: Secondary | ICD-10-CM | POA: Diagnosis present

## 2011-12-18 DIAGNOSIS — E538 Deficiency of other specified B group vitamins: Secondary | ICD-10-CM | POA: Diagnosis present

## 2011-12-18 DIAGNOSIS — D509 Iron deficiency anemia, unspecified: Secondary | ICD-10-CM | POA: Diagnosis present

## 2011-12-18 DIAGNOSIS — I2699 Other pulmonary embolism without acute cor pulmonale: Principal | ICD-10-CM | POA: Diagnosis present

## 2011-12-18 DIAGNOSIS — K279 Peptic ulcer, site unspecified, unspecified as acute or chronic, without hemorrhage or perforation: Secondary | ICD-10-CM | POA: Diagnosis present

## 2011-12-18 DIAGNOSIS — T148XXA Other injury of unspecified body region, initial encounter: Secondary | ICD-10-CM | POA: Diagnosis present

## 2011-12-18 DIAGNOSIS — G8929 Other chronic pain: Secondary | ICD-10-CM | POA: Diagnosis present

## 2011-12-18 DIAGNOSIS — D62 Acute posthemorrhagic anemia: Secondary | ICD-10-CM | POA: Diagnosis not present

## 2011-12-18 DIAGNOSIS — R079 Chest pain, unspecified: Secondary | ICD-10-CM | POA: Diagnosis present

## 2011-12-18 DIAGNOSIS — I4891 Unspecified atrial fibrillation: Secondary | ICD-10-CM | POA: Diagnosis present

## 2011-12-18 DIAGNOSIS — K8689 Other specified diseases of pancreas: Secondary | ICD-10-CM | POA: Diagnosis present

## 2011-12-18 DIAGNOSIS — M1990 Unspecified osteoarthritis, unspecified site: Secondary | ICD-10-CM | POA: Diagnosis present

## 2011-12-18 DIAGNOSIS — K922 Gastrointestinal hemorrhage, unspecified: Secondary | ICD-10-CM | POA: Diagnosis not present

## 2011-12-18 DIAGNOSIS — I10 Essential (primary) hypertension: Secondary | ICD-10-CM | POA: Diagnosis present

## 2011-12-18 LAB — POCT I-STAT TROPONIN I: Troponin i, poc: 0.02 ng/mL (ref 0.00–0.08)

## 2011-12-18 LAB — PRO B NATRIURETIC PEPTIDE: Pro B Natriuretic peptide (BNP): 5261 pg/mL — ABNORMAL HIGH (ref 0–450)

## 2011-12-18 LAB — COMPREHENSIVE METABOLIC PANEL
Albumin: 3.4 g/dL — ABNORMAL LOW (ref 3.5–5.2)
Alkaline Phosphatase: 66 U/L (ref 39–117)
BUN: 15 mg/dL (ref 6–23)
Calcium: 9 mg/dL (ref 8.4–10.5)
Potassium: 2.9 mEq/L — ABNORMAL LOW (ref 3.5–5.1)
Total Protein: 6.4 g/dL (ref 6.0–8.3)

## 2011-12-18 LAB — DIFFERENTIAL
Basophils Relative: 0 % (ref 0–1)
Eosinophils Absolute: 0.1 10*3/uL (ref 0.0–0.7)
Eosinophils Relative: 1 % (ref 0–5)
Monocytes Relative: 9 % (ref 3–12)
Neutrophils Relative %: 71 % (ref 43–77)

## 2011-12-18 LAB — CBC
Hemoglobin: 10.7 g/dL — ABNORMAL LOW (ref 12.0–15.0)
MCH: 28.4 pg (ref 26.0–34.0)
MCHC: 31 g/dL (ref 30.0–36.0)
Platelets: 231 10*3/uL (ref 150–400)

## 2011-12-18 MED ORDER — METHYLCELLULOSE 1 % OP SOLN: 1.0000 [drp] | OPHTHALMIC | Status: DC | PRN

## 2011-12-18 MED ORDER — ONDANSETRON HCL 4 MG/2ML IJ SOLN
4.0000 mg | Freq: Four times a day (QID) | INTRAMUSCULAR | Status: DC | PRN
  Administered 2011-12-18: 4 mg via INTRAVENOUS
  Filled 2011-12-18 (×2): qty 2

## 2011-12-18 MED ORDER — SODIUM CHLORIDE 0.9 % IV SOLN: INTRAVENOUS | Status: AC

## 2011-12-18 MED ORDER — CALCIUM CARBONATE-VITAMIN D 500-200 MG-UNIT PO TABS
2.0000 | ORAL_TABLET | Freq: Every day | ORAL | Status: DC
  Administered 2011-12-19 – 2011-12-25 (×7): 2 via ORAL
  Filled 2011-12-18 (×8): qty 2

## 2011-12-18 MED ORDER — ZOLPIDEM TARTRATE 5 MG PO TABS: 5.0000 mg | ORAL_TABLET | Freq: Every evening | ORAL | Status: DC | PRN

## 2011-12-18 MED ORDER — CYCLOSPORINE 0.05 % OP EMUL
1.0000 [drp] | Freq: Two times a day (BID) | OPHTHALMIC | Status: DC
  Administered 2011-12-19 – 2011-12-22 (×8): 1 [drp] via OPHTHALMIC
  Administered 2011-12-23: 11:00:00 via OPHTHALMIC
  Administered 2011-12-23 – 2011-12-25 (×4): 1 [drp] via OPHTHALMIC
  Filled 2011-12-18 (×15): qty 1

## 2011-12-18 MED ORDER — OLMESARTAN 10 MG HALF TABLET
10.0000 mg | ORAL_TABLET | Freq: Every day | ORAL | Status: DC
  Administered 2011-12-18 – 2011-12-22 (×4): 10 mg via ORAL
  Filled 2011-12-18 (×5): qty 1

## 2011-12-18 MED ORDER — ALUM & MAG HYDROXIDE-SIMETH 200-200-20 MG/5ML PO SUSP
30.0000 mL | Freq: Four times a day (QID) | ORAL | Status: DC | PRN
  Administered 2011-12-18 – 2011-12-23 (×2): 30 mL via ORAL
  Filled 2011-12-18 (×2): qty 30

## 2011-12-18 MED ORDER — OXYCODONE HCL 5 MG PO TABS
5.0000 mg | ORAL_TABLET | ORAL | Status: DC | PRN
  Administered 2011-12-19 – 2011-12-24 (×2): 5 mg via ORAL
  Filled 2011-12-18 (×2): qty 1

## 2011-12-18 MED ORDER — PANTOPRAZOLE SODIUM 40 MG PO TBEC
40.0000 mg | DELAYED_RELEASE_TABLET | Freq: Two times a day (BID) | ORAL | Status: DC
  Administered 2011-12-19 – 2011-12-25 (×13): 40 mg via ORAL
  Filled 2011-12-18: qty 1
  Filled 2011-12-18: qty 2
  Filled 2011-12-18 (×10): qty 1

## 2011-12-18 MED ORDER — CALCIUM 1200-1000 MG-UNIT PO CHEW: 1.0000 | CHEWABLE_TABLET | Freq: Every day | ORAL | Status: DC

## 2011-12-18 MED ORDER — FUROSEMIDE 20 MG PO TABS
20.0000 mg | ORAL_TABLET | Freq: Every day | ORAL | Status: DC
  Administered 2011-12-19 – 2011-12-21 (×3): 20 mg via ORAL
  Filled 2011-12-18 (×5): qty 1

## 2011-12-18 MED ORDER — ACETAMINOPHEN 325 MG PO TABS: 650.0000 mg | ORAL_TABLET | Freq: Four times a day (QID) | ORAL | Status: DC | PRN

## 2011-12-18 MED ORDER — HEPARIN SOD (PORCINE) IN D5W 100 UNIT/ML IV SOLN: 10.0000 [IU]/kg/h | Freq: Once | INTRAVENOUS | Status: DC

## 2011-12-18 MED ORDER — DIGOXIN 125 MCG PO TABS
125.0000 ug | ORAL_TABLET | Freq: Every day | ORAL | Status: DC
  Administered 2011-12-18 – 2011-12-25 (×7): 125 ug via ORAL
  Filled 2011-12-18 (×9): qty 1

## 2011-12-18 MED ORDER — DOCUSATE SODIUM 100 MG PO CAPS
100.0000 mg | ORAL_CAPSULE | Freq: Two times a day (BID) | ORAL | Status: DC
  Administered 2011-12-19 – 2011-12-21 (×5): 100 mg via ORAL
  Filled 2011-12-18 (×7): qty 1

## 2011-12-18 MED ORDER — HEPARIN BOLUS VIA INFUSION
2000.0000 [IU] | Freq: Once | INTRAVENOUS | Status: AC
  Administered 2011-12-18: 2000 [IU] via INTRAVENOUS
  Filled 2011-12-18: qty 2000

## 2011-12-18 MED ORDER — ONDANSETRON HCL 4 MG PO TABS: 4.0000 mg | ORAL_TABLET | Freq: Four times a day (QID) | ORAL | Status: DC | PRN

## 2011-12-18 MED ORDER — HYDROMORPHONE HCL PF 1 MG/ML IJ SOLN
0.5000 mg | INTRAMUSCULAR | Status: DC | PRN
  Administered 2011-12-18: 0.5 mg via INTRAVENOUS
  Filled 2011-12-18: qty 1

## 2011-12-18 MED ORDER — SODIUM CHLORIDE 0.9 % IV SOLN
Freq: Once | INTRAVENOUS | Status: AC
  Administered 2011-12-18: 13:00:00 via INTRAVENOUS

## 2011-12-18 MED ORDER — SODIUM CHLORIDE 0.9 % IV SOLN
INTRAVENOUS | Status: DC
  Administered 2011-12-18 – 2011-12-20 (×3): via INTRAVENOUS

## 2011-12-18 MED ORDER — HYDROCHLOROTHIAZIDE 12.5 MG PO CAPS
12.5000 mg | ORAL_CAPSULE | Freq: Every day | ORAL | Status: DC
  Administered 2011-12-18 – 2011-12-21 (×4): 12.5 mg via ORAL
  Filled 2011-12-18 (×5): qty 1

## 2011-12-18 MED ORDER — ACETAMINOPHEN 650 MG RE SUPP: 650.0000 mg | Freq: Four times a day (QID) | RECTAL | Status: DC | PRN

## 2011-12-18 MED ORDER — POTASSIUM CHLORIDE CRYS ER 20 MEQ PO TBCR
40.0000 meq | EXTENDED_RELEASE_TABLET | Freq: Once | ORAL | Status: AC
  Administered 2011-12-18: 40 meq via ORAL
  Filled 2011-12-18: qty 2

## 2011-12-18 MED ORDER — OCUVITE-LUTEIN PO CAPS
1.0000 | ORAL_CAPSULE | Freq: Every day | ORAL | Status: DC
  Administered 2011-12-18 – 2011-12-19 (×2): 1 via ORAL
  Filled 2011-12-18 (×3): qty 1

## 2011-12-18 MED ORDER — LUTEIN 20 MG PO TABS: 1.0000 | ORAL_TABLET | Freq: Every day | ORAL | Status: DC

## 2011-12-18 MED ORDER — RIFAXIMIN 550 MG PO TABS
550.0000 mg | ORAL_TABLET | Freq: Three times a day (TID) | ORAL | Status: DC
  Filled 2011-12-18 (×13): qty 1

## 2011-12-18 MED ORDER — VALSARTAN-HYDROCHLOROTHIAZIDE 80-12.5 MG PO TABS: 1.0000 | ORAL_TABLET | Freq: Every day | ORAL | Status: DC

## 2011-12-18 MED ORDER — NEBIVOLOL HCL 2.5 MG PO TABS
2.5000 mg | ORAL_TABLET | Freq: Every day | ORAL | Status: DC
  Administered 2011-12-18 – 2011-12-25 (×7): 2.5 mg via ORAL
  Filled 2011-12-18 (×8): qty 1

## 2011-12-18 MED ORDER — PANCRELIPASE (LIP-PROT-AMYL) 12000-38000 UNITS PO CPEP
1.0000 | ORAL_CAPSULE | Freq: Three times a day (TID) | ORAL | Status: DC
  Administered 2011-12-19 – 2011-12-25 (×21): 1 via ORAL
  Filled 2011-12-18 (×24): qty 1

## 2011-12-18 MED ORDER — HEPARIN SOD (PORCINE) IN D5W 100 UNIT/ML IV SOLN
1250.0000 [IU]/h | INTRAVENOUS | Status: DC
  Administered 2011-12-18 (×2): 1100 [IU]/h via INTRAVENOUS
  Administered 2011-12-19: 1250 [IU]/h via INTRAVENOUS
  Filled 2011-12-18 (×4): qty 250

## 2011-12-18 MED ORDER — POLYVINYL ALCOHOL 1.4 % OP SOLN
1.0000 [drp] | OPHTHALMIC | Status: DC | PRN
  Filled 2011-12-18: qty 15

## 2011-12-18 MED ORDER — IOHEXOL 300 MG/ML  SOLN: 180.0000 mL | Freq: Once | INTRAMUSCULAR | Status: AC | PRN

## 2011-12-18 NOTE — ED Notes (Signed)
[name] patient's daughter  [phone]

## 2011-12-18 NOTE — ED Notes (Signed)
Reports sob with leg swelling. Having belching and mid back pain. ekg being done at triage. Airway intact at triage.

## 2011-12-18 NOTE — Telephone Encounter (Signed)
Patient calling to report increased SOB last night. States she got so SOB that she was afraid to go to sleep. States she is having swelling in feet/legs that is worse during the day. She also reports diarrhea x 5 since last night. Stool is soft, semi formed brown in color. She is still having pain in right side and belching which is unchanged. Per Dr. Leone Payor, needs to go to ED for evaluation of SOB. Patient advised of this recommendation.

## 2011-12-18 NOTE — H&P (Signed)
[name] is an 77 y.o. female.   Chief Complaint: Shortness of breath, respiratory distress. HPI: Pt is an 77 yr old female who was recently discharged from this facility after a stay for GI Bleed.  Pt was ultimately found to have a bleeding source in the colon and this was treated.  Prior to this admission, the patient had been on coumadin for CVA risk reduction.  The coumadin was stopped on this previous admission due to her severe blood loss anemia from the GI bleed.  The patient states that she had felt weak and SOB since discharge. She had also noticed some swelling in her feet b/l.  The shortness of breath has grown progressively worse until last night the patient states that she had very labored breathing when she went to bed such that she was afraid to go to sleep for fear that she would stop breathing.  Pt also has had pain in her posterior lateral chest that she states has been present since the prior admission.  This pain is relieved by belching and is not made worse by deep respiration or cough.  I do not think it is related.  Past Medical History  Diagnosis Date  . Abnormal liver function test   . Osteoarthritis     back and lower extremities, neck  . Campath-induced atrial fibrillation   . IBS (irritable bowel syndrome)   . Pancreatic insufficiency     ? mass on imaging notborne out on follow-up  . Gastric ulcer   . Erosive gastritis 7/10  . Vitamin B12 deficiency   . Iron deficiency anemia   . Compression fracture of C-spine   GI Bleed Acute blood loss anemia  Past Surgical History  Procedure Date  . Tubal ligation   . Back surgery   . Left knee surgery   . Hernia repair   . Carpal tunnel release     right  . Wrist surgery     right plate  . Bladder surgery     tact  . Ovarian cyst removal   . Esophagogastroduodenoscopy 06/2009    erosive gastritis, small ulcer, hiatal hernia (negative biopsies)  . Colonoscopy 08/2009    diverticulosis, internal hemorrhoids   . Upper gastrointestinal endoscopy 05/30/11    normal  . Colonoscopy 06/02/11    Diverticulosis  . Capsule endoscopy 06/04/11    normal  . Esophagogastroduodenoscopy 11/29/2011    Procedure: ESOPHAGOGASTRODUODENOSCOPY (EGD);  Surgeon: Louis Meckel, MD;  Location: [facility];  Service: Endoscopy;  Laterality: N/A;  . Givens capsule study 11/29/2011    Procedure: GIVENS CAPSULE STUDY;  Surgeon: Louis Meckel, MD;  Location: [facility];  Service: Endoscopy;  Laterality: N/A;  . Enteroscopy 12/02/2011    Procedure: ENTEROSCOPY;  Surgeon: Louis Meckel, MD;  Location: [facility];  Service: Endoscopy;  Laterality: N/A;  . Colonoscopy 12/03/2011    Procedure: COLONOSCOPY;  Surgeon: Louis Meckel, MD;  Location: [facility];  Service: Endoscopy;  Laterality: N/A;  . Colonoscopy 12/04/2011    Procedure: COLONOSCOPY;  Surgeon: Rob Bunting, MD;  Location: [facility];  Service: Endoscopy;  Laterality: N/A;    Family History  Problem Relation Age of Onset  . Heart disease Mother   . Anesthesia problems Neg Hx   . Malignant hyperthermia Neg Hx    Social History:  reports that she has never smoked. She has never used smokeless tobacco. She reports that she drinks alcohol. She reports that she does not use illicit drugs.  Medications Prior to Admission  Medication Dose Route Frequency Provider Last Rate Last Dose  . 0.9 %  sodium chloride infusion   Intravenous Once Toy Baker, MD 125 mL/hr at 12/18/11 1317    . 0.9 %  sodium chloride infusion   Intravenous STAT Toy Baker, MD      . 0.9 %  sodium chloride infusion   Intravenous Continuous Daesean Lazarz, DO      . acetaminophen (TYLENOL) tablet 650 mg  650 mg Oral Q6H PRN Amika Tassin, DO       Or  . acetaminophen (TYLENOL) suppository 650 mg  650 mg Rectal Q6H PRN Korrin Waterfield, DO      . alum & mag hydroxide-simeth (MAALOX/MYLANTA) 200-200-20 MG/5ML suspension 30 mL  30 mL Oral Q6H PRN Fredie Majano, DO      . Calcium 1200-1000  MG-UNIT CHEW 1 tablet  1 tablet Oral Daily Jazariah Teall, DO      . cycloSPORINE (RESTASIS) 0.05 % ophthalmic emulsion 1 drop  1 drop Both Eyes BID Bianney Rockwood, DO      . digoxin (LANOXIN) tablet 125 mcg  125 mcg Oral Daily Cristiana Yochim, DO      . docusate sodium (COLACE) capsule 100 mg  100 mg Oral BID Priscila Bean, DO      . furosemide (LASIX) tablet 20 mg  20 mg Oral Daily Deshae Dickison, DO      . heparin ADULT infusion 100 units/ml (25000 units/250 ml)  1,100 Units/hr Intravenous Continuous Gardner Candle, PHARMD 11 mL/hr at 12/18/11 1820 1,100 Units/hr at 12/18/11 1820  . heparin bolus via infusion 2,000 Units  2,000 Units Intravenous Once Gardner Candle, PHARMD   2,000 Units at 12/18/11 1820  . HYDROmorphone (DILAUDID) injection 0.5 mg  0.5 mg Intravenous Q3H PRN Maleik Vanderzee, DO      . iohexol (OMNIPAQUE) 300 MG/ML solution 180 mL  180 mL Intravenous Once PRN Medication Radiologist      . lipase/protease/amylase (CREON-10/PANCREASE) capsule 1 capsule  1 capsule Oral TID AC Tinleigh Whitmire, DO      . Lutein TABS 20 mg  1 tablet Oral Daily Melinna Linarez, DO      . methylcellulose (ARTIFICIAL TEARS) 1 % ophthalmic solution 1 drop  1 drop Both Eyes PRN Jadalee Westcott, DO      . nebivolol (BYSTOLIC) tablet 2.5 mg  2.5 mg Oral Daily Shervin Cypert, DO      . ondansetron (ZOFRAN) tablet 4 mg  4 mg Oral Q6H PRN Janeva Peaster, DO       Or  . ondansetron (ZOFRAN) injection 4 mg  4 mg Intravenous Q6H PRN Kyaira Trantham, DO      . oxyCODONE (Oxy IR/ROXICODONE) immediate release tablet 5 mg  5 mg Oral Q4H PRN Allexus Ovens, DO      . potassium chloride SA (K-DUR,KLOR-CON) CR tablet 40 mEq  40 mEq Oral Once Toy Baker, MD   40 mEq at 12/18/11 1503  . rifaximin (XIFAXAN) tablet 550 mg  550 mg Oral TID Loa Idler, DO      . valsartan-hydrochlorothiazide (DIOVAN-HCT) 80-12.5 MG per tablet 1 tablet  1 tablet Oral Daily Lakeisa Heninger, DO      . zolpidem (AMBIEN) tablet 5 mg  5 mg Oral QHS PRN Buddie Marston, DO      . DISCONTD: heparin ADULT  infusion 100 units/ml (25000 units/250 ml)  10 Units/kg/hr (Ideal) Intravenous Once Toy Baker, MD  Medications Prior to Admission  Medication Sig Dispense Refill  . Calcium 1200-1000 MG-UNIT CHEW Take 1 by mouth once daily       . cycloSPORINE (RESTASIS) 0.05 % ophthalmic emulsion Place 1 drop into both eyes 2 (two) times daily.        . digoxin (LANOXIN) 0.125 MG tablet Take 125 mcg by mouth daily.        . furosemide (LASIX) 20 MG tablet Take 20 mg by mouth daily as needed. FLUID RETENTION      . HYDROcodone-acetaminophen (VICODIN) 5-500 MG per tablet Take 1 tablet by mouth 3 (three) times daily as needed. 1 po q 4-6 hours prn abdominal pain       . lansoprazole (PREVACID) 30 MG capsule TAKE 1 TABLET 30 MINUTES BEFORE BREAKFAST  30 capsule  11  . Lutein 20 MG TABS Take 1 tablet by mouth daily.        . methylcellulose (ARTIFICIAL TEARS) 1 % ophthalmic solution Place 1 drop into both eyes as needed. DRY EYES       . nebivolol (BYSTOLIC) 5 MG tablet Take 2.5 mg by mouth daily.        . Pancrelipase, Lip-Prot-Amyl, (CREON) 24000 UNITS CPEP Take by mouth. Take 1 tablet by mouth three times a day ac meals and snacks       . valsartan-hydrochlorothiazide (DIOVAN-HCT) 80-12.5 MG per tablet Take 1 tablet by mouth daily.        . ciprofloxacin (CIPRO) 500 MG tablet Take 1 tablet (500 mg total) by mouth daily. One tablet tablet daily for two days  2 tablet  0  . rifaximin (XIFAXAN) 550 MG TABS Take 1 tablet (550 mg total) by mouth 3 (three) times daily.  27 tablet  0    Allergies:  Allergies  Allergen Reactions  . Meperidine Hcl     REACTION: nausea: VOMIT  . Morphine Sulfate     REACTION: nausea: "VOMIT"    Constitutional: positive for fatigue and malaise, negative for chills, fevers and night sweats Eyes: negative for icterus, irritation, redness and visual disturbance Ears, nose, mouth, throat, and face: negative for epistaxis, nasal congestion and sore throat Respiratory:  positive for cough, dyspnea on exertion, pleurisy/chest pain and dyspnea at rest. Cardiovascular: negative for chest pain, exertional chest pressure/discomfort, irregular heart beat and syncope Gastrointestinal: negative for constipation, diarrhea, jaundice, melena, nausea and vomiting Genitourinary:negative for dysuria, frequency, hematuria and hesitancy Integument/breast: negative for pruritus, rash and skin color change Hematologic/lymphatic: positive for bleeding, negative for easy bruising, lymphadenopathy and petechiae Musculoskeletal:positive for arthralgias and myalgias, negative for bone pain, muscle weakness and neck pain Neurological: negative for dizziness, gait problems, seizures and weakness Behavioral/Psych: negative except for sleep disturbance Endocrine: negative   General appearance: alert, cooperative, appears stated age, fatigued and mild distress Head: Normocephalic, without obvious abnormality, atraumatic Eyes: conjunctivae/corneas clear. PERRL, EOM's intact. Fundi benign. Throat: lips, mucosa, and tongue normal; teeth and gums normal Neck: no adenopathy, no carotid bruit, no JVD, supple, symmetrical, trachea midline and thyroid not enlarged, symmetric, no tenderness/mass/nodules Resp: clear to auscultation bilaterally Chest wall: no tenderness Cardio: regular rate and rhythm, S1, S2 normal, no murmur, click, rub or gallop and no rub GI: soft, non-tender; bowel sounds normal; no masses,  no organomegaly Extremities: extremities normal, atraumatic, no cyanosis or edema Pulses: 2+ and symmetric Skin: Skin color, texture, turgor normal. No rashes or lesions Lymph nodes: Cervical, supraclavicular, and axillary nodes normal. Neurologic: Alert and oriented X 3, normal strength and  tone. Normal symmetric reflexes. Normal coordination and gait   Results for orders placed during the hospital encounter of 12/18/11 (from the past 48 hour(s))  CBC     Status: Abnormal    Collection Time   12/18/11  1:03 PM      Component Value Range Comment   WBC 5.0  4.0 - 10.5 (K/uL)    RBC 3.77 (*) 3.87 - 5.11 (MIL/uL)    Hemoglobin 10.7 (*) 12.0 - 15.0 (g/dL)    HCT 16.1 (*) 09.6 - 46.0 (%)    MCV 91.5  78.0 - 100.0 (fL)    MCH 28.4  26.0 - 34.0 (pg)    MCHC 31.0  30.0 - 36.0 (g/dL)    RDW 04.5  40.9 - 81.1 (%)    Platelets 231  150 - 400 (K/uL)   DIFFERENTIAL     Status: Normal   Collection Time   12/18/11  1:03 PM      Component Value Range Comment   Neutrophils Relative 71  43 - 77 (%)    Neutro Abs 3.5  1.7 - 7.7 (K/uL)    Lymphocytes Relative 18  12 - 46 (%)    Lymphs Abs 0.9  0.7 - 4.0 (K/uL)    Monocytes Relative 9  3 - 12 (%)    Monocytes Absolute 0.5  0.1 - 1.0 (K/uL)    Eosinophils Relative 1  0 - 5 (%)    Eosinophils Absolute 0.1  0.0 - 0.7 (K/uL)    Basophils Relative 0  0 - 1 (%)    Basophils Absolute 0.0  0.0 - 0.1 (K/uL)   COMPREHENSIVE METABOLIC PANEL     Status: Abnormal   Collection Time   12/18/11  1:03 PM      Component Value Range Comment   Sodium 144  135 - 145 (mEq/L)    Potassium 2.9 (*) 3.5 - 5.1 (mEq/L)    Chloride 106  96 - 112 (mEq/L)    CO2 26  19 - 32 (mEq/L)    Glucose, Bld 111 (*) 70 - 99 (mg/dL)    BUN 15  6 - 23 (mg/dL)    Creatinine, Ser 9.14  0.50 - 1.10 (mg/dL)    Calcium 9.0  8.4 - 10.5 (mg/dL)    Total Protein 6.4  6.0 - 8.3 (g/dL)    Albumin 3.4 (*) 3.5 - 5.2 (g/dL)    AST 13  0 - 37 (U/L)    ALT 12  0 - 35 (U/L)    Alkaline Phosphatase 66  39 - 117 (U/L)    Total Bilirubin 0.6  0.3 - 1.2 (mg/dL)    GFR calc non Af Amer 57 (*) >90 (mL/min)    GFR calc Af Amer 66 (*) >90 (mL/min)   DIGOXIN LEVEL     Status: Abnormal   Collection Time   12/18/11  1:03 PM      Component Value Range Comment   Digoxin Level 0.5 (*) 0.8 - 2.0 (ng/mL)   PRO B NATRIURETIC PEPTIDE     Status: Abnormal   Collection Time   12/18/11  1:06 PM      Component Value Range Comment   Pro B Natriuretic peptide (BNP) 5261.0 (*) 0 - 450  (pg/mL)   POCT I-STAT TROPONIN I     Status: Normal   Collection Time   12/18/11  2:24 PM      Component Value Range Comment   Troponin i, poc 0.02  0.00 -  0.08 (ng/mL)    Comment 3             @[id number] @  Blood pressure 131/74, pulse 83, temperature 97.8 F (36.6 C), temperature source Oral, resp. rate 20, height 5\' 6"  (1.676 m), weight 80.9 kg (178 lb 5.6 oz), SpO2 96.00%.  *RADIOLOGY REPORT*  Clinical Data: 77 year old female with right back pain, productive  cough, shortness of breath.  CT ANGIOGRAPHY CHEST WITH CONTRAST  Technique: Multidetector CT imaging of the chest was performed  using the standard protocol during bolus administration of  intravenous contrast. Multiplanar CT image reconstructions  including MIPs were obtained to evaluate the vascular anatomy.  Contrast: 180 ml Omnipaque-300.  Comparison: 12/02/2011 and earlier.  Findings: Contrast bolus had to be repeated due to poor timing  initially. Timing on the second bolus is adequate, and there are  low density pulmonary artery filling defects at the right middle  lobe and lower lobe (series 11 image 145). Left lower lobe  superior segment level filling defect also noted.  Superimposed chronic pulmonary artery enlargement suggestive of  pulmonary artery hypertension. Chronic cardiomegaly. Trace  pleural effusions. No pericardial effusion.  Stable visualized upper abdominal viscera. No mediastinal  lymphadenopathy. Stable thoracic inlet with multiple thyroid  nodules.  Major airways remain patent. Mildly increased atelectasis. No  pulmonary consolidation. Recently seen small pulmonary nodules are  stable.  Scoliosis. Stable anterolisthesis of C7-9 T1. Chronic T8 and T9  compression fractures are stable. Occasional thoracic vertebral  body hemangiomas. Chronic left tenth rib fracture is stable. No  acute osseous abnormality identified.  Review of the MIP images confirms the above findings.  IMPRESSION:  1.  Positive for bilateral lower lobe acute pulmonary emboli.  2. Underlying cardiomegaly and pulmonary artery hypertension.  3. Trace pleural effusions. Mildly increased atelectasis.  4. Chronic thoracic scoliosis and compression fractures.  Critical Value/emergent results were called by telephone at the  time of interpretation on 12/18/2011 at 1603 hours to Dr.  Lorre Nick, who verbally acknowledged these results.  Original Report Authenticated By: Harley Hallmark, M.D.   Assessment/Plan 1. B/L Pulmonary emboli.  Will give patient IV heparin as dosed by pharmacy.  Pt is at high risk of recurrent GI bleed due to recent GI bleed and history of upper GI bleed.  Will check H&H frequently and transfuse as necessary.  Ultimately, after patient is over the acute phase of this illness and her respiratory function has returned to normal she should be given an IVC filter to protect her from future PE's as she will remain at high risk for GI bleed and she is no longer an candidate for coumadin.  2. GI Bleed secondary to lesion found in transverse colon.  Monitor H&H carefully and transfuse as necessary.  PPI to protect pt from upper GI bleed from which she has also suffered this year due to gastric ulcer and erosive gastritis.  3. Atrial fibrillation - rate controlled.  Pt is not a candidate for coumadin due to repeated and serious GI bleeds. 5. Pancreatic insufficiency. Continue pancrease. 6. Iron deficiency anemia - continue supplement. 7. Osteoarthritis - pain control.  Avoid NSAIDS.    Kimoni Pickerill 12/18/2011, 8:26 PM

## 2011-12-18 NOTE — Progress Notes (Signed)
ANTICOAGULATION CONSULT NOTE - Initial Consult  Pharmacy Consult for heparin Indication: pulmonary embolus (also hx afib, off Coumadin due to GI bleeding earlier this month)  Allergies  Allergen Reactions  . Meperidine Hcl     REACTION: nausea: VOMIT  . Morphine Sulfate     REACTION: nausea: "VOMIT"    Patient Measurements:   Adjusted Body Weight: 76.7 heparin dosing weight  Vital Signs: Temp: 97.6 F (36.4 C) (12/31 1458) Temp src: Oral (12/31 1458) BP: 119/70 mmHg (12/31 1458) Pulse Rate: 63  (12/31 1458)  Labs:  Basename 12/18/11 1303  HGB 10.7*  HCT 34.5*  PLT 231  APTT --  LABPROT --  INR --  HEPARINUNFRC --  CREATININE 0.89  CKTOTAL --  CKMB --  TROPONINI --   The CrCl is unknown because both a height and weight (above a minimum accepted value) are required for this calculation.  Medical History: Past Medical History  Diagnosis Date  . Abnormal liver function test   . Osteoarthritis     back and lower extremities, neck  . Campath-induced atrial fibrillation   . IBS (irritable bowel syndrome)   . Pancreatic insufficiency     ? mass on imaging notborne out on follow-up  . Gastric ulcer   . Erosive gastritis 7/10  . Vitamin B12 deficiency   . Iron deficiency anemia   . Compression fracture of C-spine     Medications:  Scheduled:    . sodium chloride   Intravenous Once  . sodium chloride   Intravenous STAT  . potassium chloride  40 mEq Oral Once  . DISCONTD: heparin  10 Units/kg/hr (Ideal) Intravenous Once    Assessment: 77 yo female with hx afib, Coumadin stopped to to GI bleeding earlier this month.  Now admitted with PE, to begin IV heparin.  Will dose cautiously using [place] dosing, given recent GI bleed.  Weight = 82.7 at last admission (11/27/11).  Goal of Therapy:  Heparin level 0.3-0.7 units/ml   Plan:  1. Heparin 2000 units IV x 1, then start drip at 1100 units/hr. 2, Check heparin level 8 hrs after gtt starts. 3. Daily  heparin level and CBC, 4. Monitor closely for any signs of recurrent GI bleed.  Reece Leader, Pharm D 12/18/2011 5:55 PM

## 2011-12-18 NOTE — ED Provider Notes (Addendum)
History     CSN: [id number]  Arrival date & time 12/18/11  1158   First MD Initiated Contact with Patient 12/18/11 1246      Chief Complaint  Patient presents with  . Shortness of Breath  . Leg Swelling    (Consider location/radiation/quality/duration/timing/severity/associated sxs/prior treatment) The history is provided by the patient and a relative.   patient here with shortness of breath and lower extremity swelling x4 days. Symptoms are worse with exertion and lying flat. Better with rest. No fever, some nonproductive cough. No vomiting or diarrhea. Denies chest pain. No medications taken prior to arrival. Patient has a history of atrial fibrillation and congestive heart  Past Medical History  Diagnosis Date  . Abnormal liver function test   . Osteoarthritis     back and lower extremities, neck  . Campath-induced atrial fibrillation   . IBS (irritable bowel syndrome)   . Pancreatic insufficiency     ? mass on imaging notborne out on follow-up  . Gastric ulcer   . Erosive gastritis 7/10  . Vitamin B12 deficiency   . Iron deficiency anemia   . Compression fracture of C-spine     Past Surgical History  Procedure Date  . Tubal ligation   . Back surgery   . Left knee surgery   . Hernia repair   . Carpal tunnel release     right  . Wrist surgery     right plate  . Bladder surgery     tact  . Ovarian cyst removal   . Esophagogastroduodenoscopy 06/2009    erosive gastritis, small ulcer, hiatal hernia (negative biopsies)  . Colonoscopy 08/2009    diverticulosis, internal hemorrhoids  . Upper gastrointestinal endoscopy 05/30/11    normal  . Colonoscopy 06/02/11    Diverticulosis  . Capsule endoscopy 06/04/11    normal  . Esophagogastroduodenoscopy 11/29/2011    Procedure: ESOPHAGOGASTRODUODENOSCOPY (EGD);  Surgeon: Louis Meckel, MD;  Location: [facility];  Service: Endoscopy;  Laterality: N/A;  . Givens capsule study 11/29/2011    Procedure: GIVENS CAPSULE  STUDY;  Surgeon: Louis Meckel, MD;  Location: [facility];  Service: Endoscopy;  Laterality: N/A;  . Enteroscopy 12/02/2011    Procedure: ENTEROSCOPY;  Surgeon: Louis Meckel, MD;  Location: [facility];  Service: Endoscopy;  Laterality: N/A;  . Colonoscopy 12/03/2011    Procedure: COLONOSCOPY;  Surgeon: Louis Meckel, MD;  Location: [facility];  Service: Endoscopy;  Laterality: N/A;  . Colonoscopy 12/04/2011    Procedure: COLONOSCOPY;  Surgeon: Rob Bunting, MD;  Location: [facility];  Service: Endoscopy;  Laterality: N/A;    Family History  Problem Relation Age of Onset  . Heart disease Mother   . Anesthesia problems Neg Hx   . Malignant hyperthermia Neg Hx     History  Substance Use Topics  . Smoking status: Never Smoker   . Smokeless tobacco: Never Used  . Alcohol Use: Yes     glass of wine occasionaly    OB History    Grav Para Term Preterm Abortions TAB SAB Ect Mult Living                  Review of Systems  All other systems reviewed and are negative.    Allergies  Meperidine hcl and Morphine sulfate  Home Medications   Current Outpatient Rx  Name Route Sig Dispense Refill  . CALCIUM 1200-1000 MG-UNIT PO CHEW  Take 1 by mouth once daily     .  CYCLOSPORINE 0.05 % OP EMUL Both Eyes Place 1 drop into both eyes 2 (two) times daily.      Marland Kitchen DIGOXIN 0.125 MG PO TABS Oral Take 125 mcg by mouth daily.      . FUROSEMIDE 20 MG PO TABS Oral Take 20 mg by mouth daily as needed. FLUID RETENTION    . HYDROCODONE-ACETAMINOPHEN 5-500 MG PO TABS Oral Take 1 tablet by mouth 3 (three) times daily as needed. 1 po q 4-6 hours prn abdominal pain     . LANSOPRAZOLE 30 MG PO CPDR  TAKE 1 TABLET 30 MINUTES BEFORE BREAKFAST 30 capsule 11  . LUTEIN 20 MG PO TABS Oral Take 1 tablet by mouth daily.      . METHYLCELLULOSE 1 % OP SOLN Both Eyes Place 1 drop into both eyes as needed. DRY EYES     . NEBIVOLOL HCL 5 MG PO TABS Oral Take 2.5 mg by mouth daily.      Marland Kitchen PANCRELIPASE  (LIP-PROT-AMYL) 24000 UNITS PO CPEP Oral Take by mouth. Take 1 tablet by mouth three times a day ac meals and snacks     . VALSARTAN-HYDROCHLOROTHIAZIDE 80-12.5 MG PO TABS Oral Take 1 tablet by mouth daily.      Marland Kitchen CIPROFLOXACIN HCL 500 MG PO TABS Oral Take 1 tablet (500 mg total) by mouth daily. One tablet tablet daily for two days 2 tablet 0  . RIFAXIMIN 550 MG PO TABS Oral Take 1 tablet (550 mg total) by mouth 3 (three) times daily. 27 tablet 0    BP 126/70  Pulse 71  Temp(Src) 97.7 F (36.5 C) (Oral)  Resp 22  SpO2 94%  Physical Exam  Nursing note and vitals reviewed. Constitutional: She is oriented to person, place, and time. She appears well-developed and well-nourished.  Non-toxic appearance. No distress.  HENT:  Head: Normocephalic and atraumatic.  Eyes: Conjunctivae, EOM and lids are normal. Pupils are equal, round, and reactive to light.  Neck: Normal range of motion. Neck supple. No tracheal deviation present. No mass present.  Cardiovascular: Normal rate, regular rhythm and normal heart sounds.  Exam reveals no gallop.   No murmur heard. Pulmonary/Chest: Effort normal. No stridor. No respiratory distress. She has decreased breath sounds. She has no wheezes. She has rhonchi. She has no rales.  Abdominal: Soft. Normal appearance and bowel sounds are normal. She exhibits no distension. There is no tenderness. There is no rebound and no CVA tenderness.  Musculoskeletal: Normal range of motion. She exhibits no edema and no tenderness.       Right knee: She exhibits swelling.       Legs: Neurological: She is alert and oriented to person, place, and time. She has normal strength. No cranial nerve deficit or sensory deficit. GCS eye subscore is 4. GCS verbal subscore is 5. GCS motor subscore is 6.  Skin: Skin is warm and dry. No abrasion and no rash noted.  Psychiatric: She has a normal mood and affect. Her speech is normal and behavior is normal.    ED Course  Procedures  (including critical care time)   Labs Reviewed  CBC  DIFFERENTIAL  COMPREHENSIVE METABOLIC PANEL  I-STAT TROPONIN I  PRO B NATRIURETIC PEPTIDE   No results found.   No diagnosis found.    MDM   Date: 12/18/2011  Rate: 72  Rhythm: atrial fibrillation  QRS Axis: left  Intervals: normal  ST/T Wave abnormalities: nonspecific ST/T changes  Conduction Disutrbances:left bundle branch block  Narrative Interpretation:  Old EKG Reviewed: unchanged    4:08 PM  Patient's chest CT showed pulmonary embolism. Patient admitted by [facility] hospitalist. Patient does have a history of GI bleeding in the past. Will keep patient bed rest. We'll discuss with admitting team     Toy Baker, MD 12/18/11 1609  4:31 PM Patient to be started on heparin heparin by pharmacy due 2  her pulmonary embolism. Spoke with Dr. Gerri Lins is to admit patient.  Toy Baker, MD 12/18/11 [phone]

## 2011-12-19 DIAGNOSIS — I2699 Other pulmonary embolism without acute cor pulmonale: Secondary | ICD-10-CM

## 2011-12-19 HISTORY — DX: Other pulmonary embolism without acute cor pulmonale: I26.99

## 2011-12-19 LAB — CBC
MCH: 28.6 pg (ref 26.0–34.0)
MCV: 92.4 fL (ref 78.0–100.0)
Platelets: 226 10*3/uL (ref 150–400)
RBC: 3.29 MIL/uL — ABNORMAL LOW (ref 3.87–5.11)

## 2011-12-19 LAB — COMPREHENSIVE METABOLIC PANEL
AST: 10 U/L (ref 0–37)
BUN: 12 mg/dL (ref 6–23)
CO2: 26 mEq/L (ref 19–32)
Calcium: 8.4 mg/dL (ref 8.4–10.5)
Creatinine, Ser: 0.84 mg/dL (ref 0.50–1.10)
GFR calc Af Amer: 70 mL/min — ABNORMAL LOW (ref >90)
GFR calc non Af Amer: 61 mL/min — ABNORMAL LOW (ref >90)

## 2011-12-19 LAB — PROTIME-INR
INR: 1.19 (ref 0.00–1.49)
Prothrombin Time: 15.4 seconds — ABNORMAL HIGH (ref 11.6–15.2)

## 2011-12-19 LAB — HEPARIN LEVEL (UNFRACTIONATED): Heparin Unfractionated: 0.17 IU/mL — ABNORMAL LOW (ref 0.30–0.70)

## 2011-12-19 MED ORDER — HEPARIN BOLUS VIA INFUSION
1000.0000 [IU] | Freq: Once | INTRAVENOUS | Status: AC
  Administered 2011-12-19: 1000 [IU] via INTRAVENOUS
  Filled 2011-12-19: qty 1000

## 2011-12-19 MED ORDER — HEPARIN SOD (PORCINE) IN D5W 100 UNIT/ML IV SOLN
1450.0000 [IU]/h | INTRAVENOUS | Status: DC
  Administered 2011-12-19 – 2011-12-21 (×4): 1450 [IU]/h via INTRAVENOUS
  Filled 2011-12-19 (×6): qty 250

## 2011-12-19 MED ORDER — WARFARIN SODIUM 3 MG PO TABS
3.0000 mg | ORAL_TABLET | Freq: Once | ORAL | Status: AC
  Administered 2011-12-19: 3 mg via ORAL
  Filled 2011-12-19: qty 1

## 2011-12-19 NOTE — Progress Notes (Signed)
Subjective: Breathing better but still SoB with slightest activity Had BM today, no blood noted C/o Belching  Objective: Vital signs in last 24 hours: Temp:  [97.6 F (36.4 C)-98.1 F (36.7 C)] 98 F (36.7 C) (01/01 0654) Pulse Rate:  [56-111] 64  (01/01 0938) Resp:  [20-22] 20  (01/01 0654) BP: (104-131)/(62-89) 107/89 mmHg (01/01 0654) SpO2:  [93 %-97 %] 93 % (01/01 0654) FiO2 (%):  [28 %] 28 % (12/31 2018) Weight:  [80.9 kg (178 lb 5.6 oz)] 178 lb 5.6 oz (80.9 kg) (12/31 1837) Weight change:     Intake/Output from previous day: 12/31 0701 - 01/01 0700 In: 1568.1 [I.V.:1568.1] Out: -    Physical Exam: General: Alert, awake, oriented x3, in no acute distress. HEENT: No bruits, no goiter. Heart: Regular rate and rhythm, without murmurs, rubs, gallops. Lungs: Clear to auscultation bilaterally, decreased breath sounds at bases Abdomen: Soft, nontender, nondistended, positive bowel sounds. Extremities: No clubbing cyanosis or edema with positive pedal pulses. Neuro: Grossly intact, nonfocal.  Lab Results: Basic Metabolic Panel:  Basename 12/19/11 0222 12/18/11 1303  NA 144 144  K 3.5 2.9*  CL 111 106  CO2 26 26  GLUCOSE 108* 111*  BUN 12 15  CREATININE 0.84 0.89  CALCIUM 8.4 9.0  MG -- --  PHOS -- --   Liver Function Tests:  Basename 12/19/11 0222 12/18/11 1303  AST 10 13  ALT 10 12  ALKPHOS 55 66  BILITOT 0.5 0.6  PROT 5.2* 6.4  ALBUMIN 2.9* 3.4*   No results found for this basename: LIPASE:2,AMYLASE:2 in the last 72 hours No results found for this basename: AMMONIA:2 in the last 72 hours CBC:  Basename 12/19/11 0222 12/18/11 1303  WBC 4.3 5.0  NEUTROABS -- 3.5  HGB 9.4* 10.7*  HCT 30.4* 34.5*  MCV 92.4 91.5  PLT 226 231   Cardiac Enzymes: No results found for this basename: CKTOTAL:3,CKMB:3,CKMBINDEX:3,TROPONINI:3 in the last 72 hours BNP:  Basename 12/18/11 1306  PROBNP 5261.0*   D-Dimer: No results found for this basename: DDIMER:2 in  the last 72 hours CBG: No results found for this basename: GLUCAP:6 in the last 72 hours Hemoglobin A1C: No results found for this basename: HGBA1C in the last 72 hours Fasting Lipid Panel: No results found for this basename: CHOL,HDL,LDLCALC,TRIG,CHOLHDL,LDLDIRECT in the last 72 hours Thyroid Function Tests: No results found for this basename: TSH,T4TOTAL,FREET4,T3FREE,THYROIDAB in the last 72 hours Anemia Panel: No results found for this basename: VITAMINB12,FOLATE,FERRITIN,TIBC,IRON,RETICCTPCT in the last 72 hours Coagulation:  Basename 12/19/11 0222  LABPROT 15.4*  INR 1.19   Urine Drug Screen: Drugs of Abuse  No results found for this basename: labopia, cocainscrnur, labbenz, amphetmu, thcu, labbarb    Alcohol Level: No results found for this basename: ETH:2 in the last 72 hours  No results found for this or any previous visit (from the past 240 hour(s)).  Studies/Results: Dg Chest 2 View  12/18/2011  *RADIOLOGY REPORT*  Clinical Data: Chest pain  CHEST - 2 VIEW  Comparison: CT chest dated 12/02/2011  Findings: Mild interstitial markings. No pleural effusion or pneumothorax.  Mild cardiomegaly.  Degenerative changes of the visualized thoracolumbar spine.  IMPRESSION: No evidence of acute cardiopulmonary disease.  Mild cardiomegaly.  Original Report Authenticated By: Charline Bills, M.D.   Ct Angio Chest W/cm &/or Wo Cm  12/18/2011  *RADIOLOGY REPORT*  Clinical Data:  78 year old female with right back pain, productive cough, shortness of breath.  CT ANGIOGRAPHY CHEST WITH CONTRAST  Technique:  Multidetector CT  imaging of the chest was performed using the standard protocol during bolus administration of intravenous contrast.  Multiplanar CT image reconstructions including MIPs were obtained to evaluate the vascular anatomy.  Contrast:  180 ml Omnipaque-300.  Comparison:  12/02/2011 and earlier.  Findings:  Contrast bolus had to be repeated due to poor timing initially.  Timing  on the second bolus is adequate, and there are low density pulmonary artery filling defects at the right middle lobe and lower lobe (series 11 image 145).  Left lower lobe superior segment level filling defect also noted.  Superimposed chronic pulmonary artery enlargement suggestive of pulmonary artery hypertension.  Chronic cardiomegaly.  Trace pleural effusions.  No pericardial effusion.  Stable visualized upper abdominal viscera.  No mediastinal lymphadenopathy.  Stable thoracic inlet with multiple thyroid nodules.  Major airways remain patent.  Mildly increased atelectasis.  No pulmonary consolidation.  Recently seen small pulmonary nodules are stable.  Scoliosis.  Stable anterolisthesis of C7-9 T1.  Chronic T8 and T9 compression fractures are stable.  Occasional thoracic vertebral body hemangiomas.  Chronic left tenth rib fracture is stable. No acute osseous abnormality identified.  Review of the MIP images confirms the above findings.  IMPRESSION: 1.  Positive for bilateral lower lobe acute pulmonary emboli. 2.  Underlying cardiomegaly and pulmonary artery hypertension. 3.  Trace pleural effusions.  Mildly increased atelectasis. 4.  Chronic thoracic scoliosis and compression fractures.  Critical Value/emergent results were called by telephone at the time of interpretation on 12/18/2011  at 1603 hours  to  Dr. Lorre Nick, who verbally acknowledged these results.  Original Report Authenticated By: Harley Hallmark, M.D.    Medications: Scheduled Meds:   . sodium chloride   Intravenous Once  . sodium chloride   Intravenous STAT  . calcium-vitamin D  2 tablet Oral Daily  . cycloSPORINE  1 drop Both Eyes BID  . digoxin  125 mcg Oral Daily  . docusate sodium  100 mg Oral BID  . furosemide  20 mg Oral Daily  . heparin  2,000 Units Intravenous Once  . hydrochlorothiazide  12.5 mg Oral Daily  . lipase/protease/amylase  1 capsule Oral TID AC  . multivitamin-lutein  1 capsule Oral Daily  . nebivolol   2.5 mg Oral Daily  . olmesartan  10 mg Oral Daily  . pantoprazole  40 mg Oral BID AC  . potassium chloride  40 mEq Oral Once  . rifaximin  550 mg Oral TID  . DISCONTD: Calcium  1 tablet Oral Daily  . DISCONTD: heparin  10 Units/kg/hr (Ideal) Intravenous Once  . DISCONTD: Lutein  1 tablet Oral Daily  . DISCONTD: valsartan-hydrochlorothiazide  1 tablet Oral Daily   Continuous Infusions:   . sodium chloride 50 mL/hr at 12/19/11 0230  . heparin 1,250 Units/hr (12/19/11 0600)   PRN Meds:.acetaminophen, acetaminophen, alum & mag hydroxide-simeth, HYDROmorphone, iohexol, ondansetron (ZOFRAN) IV, ondansetron, oxyCODONE, polyvinyl alcohol, zolpidem, DISCONTD: methylcellulose  Assessment/Plan: 1. Pulmonary embolism, bilateral: continue IV heaprin, start coumadin today Will need to monitor closely for recurrence of GI bleeds, if this recurs will need IVC filter Risk factor: recent hospitalization, and recent discontinuation of coumadin 2. ANEMIA-IRON DEFICIENCY: Hb stable, continue to monitor 3. Atrial Fibrillation: Rate controlled continue digoxin, coumadin being restarted for #1 4. 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 Htn: stable   LOS: 1 day   Samariah Hokenson [facility] Pager: [phone] 12/19/2011, 12:57 PM

## 2011-12-19 NOTE — Progress Notes (Signed)
ANTICOAGULATION CONSULT NOTE - Follow Up Consult  Pharmacy Consult for heparin, coumadin  Indication: pulmonary embolus (also hx afib, off Coumadin due to GI bleeding earlier this month)   Allergies  Allergen Reactions  . Meperidine Hcl     REACTION: nausea: VOMIT  . Morphine Sulfate     REACTION: nausea: "VOMIT"    Patient Measurements: Height: 5\' 6"  (167.6 cm) Weight: 178 lb 5.6 oz (80.9 kg) IBW/kg (Calculated) : 59.3  Adjusted Body Weight:  Vital Signs: Temp: 97.9 F (36.6 C) (01/01 1400) Temp src: Oral (01/01 1400) BP: 95/57 mmHg (01/01 1400) Pulse Rate: 60  (01/01 1400)  Labs:  Basename 12/19/11 1440 12/19/11 0222 12/18/11 1303  HGB -- 9.4* 10.7*  HCT -- 30.4* 34.5*  PLT -- 226 231  APTT -- -- --  LABPROT -- 15.4* --  INR -- 1.19 --  HEPARINUNFRC 0.17* 0.24* --  CREATININE -- 0.84 0.89  CKTOTAL -- -- --  CKMB -- -- --  TROPONINI -- -- --   Estimated Creatinine Clearance: 50.6 ml/min (by C-G formula based on Cr of 0.84).   Medications:  Infusions:     . sodium chloride 50 mL/hr at 12/19/11 0230  . heparin 1,250 Units/hr (12/19/11 1459)    Assessment: 78 yo female with hx afib, Coumadin stopped to to GI bleeding earlier this month. Now admitted with PE. Initial heparin level  below goal at 0.17.  Last know coumadin 2 mg on Sunday, Tuesday, Wednesday, Thursday and Saturday; and 3mg  on Monday and Friday; current INR=1.19.  Goal of Therapy:  Heparin level 0.3-0.7 units/ml INR=2-3   Plan:  -Will start on 3mg  po x1 based on last know home regimen -Daily PT/INR -Heparin bolus of 1000 units then Increase heparin to 1450 units/hr  Benny Lennert 12/19/2011,3:51 PM

## 2011-12-19 NOTE — Progress Notes (Signed)
ANTICOAGULATION CONSULT NOTE - Follow Up Consult  Pharmacy Consult for heparin  Indication: pulmonary embolus (also hx afib, off Coumadin due to GI bleeding earlier this month)   Allergies  Allergen Reactions  . Meperidine Hcl     REACTION: nausea: VOMIT  . Morphine Sulfate     REACTION: nausea: "VOMIT"    Patient Measurements: Height: 5\' 6"  (167.6 cm) Weight: 178 lb 5.6 oz (80.9 kg) IBW/kg (Calculated) : 59.3  Adjusted Body Weight:  Vital Signs: Temp: 98.1 F (36.7 C) (12/31 2250) Temp src: Oral (12/31 2250) BP: 104/65 mmHg (12/31 2250) Pulse Rate: 111  (12/31 2250)  Labs:  Basename 12/19/11 0222 12/18/11 1303  HGB 9.4* 10.7*  HCT 30.4* 34.5*  PLT 226 231  APTT -- --  LABPROT 15.4* --  INR 1.19 --  HEPARINUNFRC 0.24* --  CREATININE 0.84 0.89  CKTOTAL -- --  CKMB -- --  TROPONINI -- --   Estimated Creatinine Clearance: 50.6 ml/min (by C-G formula based on Cr of 0.84).   Medications:  Infusions:    . sodium chloride 50 mL/hr at 12/19/11 0230  . heparin 1,100 Units/hr (12/18/11 1820)    Assessment: 78 yo female with hx afib, Coumadin stopped to to GI bleeding earlier this month. Now admitted with PE. Initial heparin level was just below goal at 0.24. No bleeding complications have been noted, no issues with IV per nursing. Will make rate adjustments.   Goal of Therapy:  Heparin level 0.3-0.7 units/ml   Plan:  Increase IV heparin to 1250 units/hr Repeat 8 hour follow up level.  Severiano Gilbert 12/19/2011,5:49 AM

## 2011-12-20 ENCOUNTER — Telehealth: Payer: Self-pay | Admitting: Internal Medicine

## 2011-12-20 LAB — CBC
HCT: 32.8 % — ABNORMAL LOW (ref 36.0–46.0)
MCH: 29.2 pg (ref 26.0–34.0)
MCHC: 31.4 g/dL (ref 30.0–36.0)
MCHC: 31.1 g/dL (ref 30.0–36.0)
Platelets: 241 10*3/uL (ref 150–400)
Platelets: 262 10*3/uL (ref 150–400)
RDW: 15.1 % (ref 11.5–15.5)
RDW: 15 % (ref 11.5–15.5)
WBC: 5.6 10*3/uL (ref 4.0–10.5)

## 2011-12-20 LAB — BASIC METABOLIC PANEL
Calcium: 8.6 mg/dL (ref 8.4–10.5)
GFR calc Af Amer: 51 mL/min — ABNORMAL LOW (ref >90)
GFR calc non Af Amer: 44 mL/min — ABNORMAL LOW (ref >90)
Potassium: 3.4 mEq/L — ABNORMAL LOW (ref 3.5–5.1)
Sodium: 141 mEq/L (ref 135–145)

## 2011-12-20 LAB — HEPARIN LEVEL (UNFRACTIONATED): Heparin Unfractionated: 0.49 IU/mL (ref 0.30–0.70)

## 2011-12-20 LAB — PROTIME-INR
INR: 1.18 (ref 0.00–1.49)
Prothrombin Time: 15.2 seconds (ref 11.6–15.2)

## 2011-12-20 MED ORDER — POTASSIUM CHLORIDE CRYS ER 20 MEQ PO TBCR
20.0000 meq | EXTENDED_RELEASE_TABLET | Freq: Once | ORAL | Status: AC
  Administered 2011-12-20: 20 meq via ORAL
  Filled 2011-12-20: qty 1

## 2011-12-20 MED ORDER — PROSIGHT PO TABS
1.0000 | ORAL_TABLET | Freq: Every day | ORAL | Status: DC
  Administered 2011-12-20 – 2011-12-25 (×6): 1 via ORAL
  Filled 2011-12-20 (×6): qty 1

## 2011-12-20 MED ORDER — HYDROCODONE-ACETAMINOPHEN 5-325 MG PO TABS
1.0000 | ORAL_TABLET | Freq: Four times a day (QID) | ORAL | Status: DC | PRN
  Administered 2011-12-20 – 2011-12-25 (×7): 1 via ORAL
  Filled 2011-12-20 (×7): qty 1

## 2011-12-20 MED ORDER — WARFARIN SODIUM 5 MG PO TABS
5.0000 mg | ORAL_TABLET | Freq: Once | ORAL | Status: AC
  Administered 2011-12-20: 5 mg via ORAL
  Filled 2011-12-20: qty 1

## 2011-12-20 NOTE — Progress Notes (Signed)
Subjective: Breathing continues to improve Had a dark stool today X1 C/o Belching  Objective: Vital signs in last 24 hours: Temp:  [97.6 F (36.4 C)-98.2 F (36.8 C)] 97.6 F (36.4 C) (01/02 0500) Pulse Rate:  [55-64] 64  (01/02 1012) Resp:  [18-20] 18  (01/02 0500) BP: (96-107)/(55-70) 107/70 mmHg (01/02 1015) SpO2:  [94 %-95 %] 94 % (01/02 0500) Weight change:  Last BM Date: 12/19/11  Intake/Output from previous day: 01/01 0701 - 01/02 0700 In: 2105 [P.O.:720; I.V.:1385] Out: 5 [Urine:4; Stool:1] Total I/O In: 400 [P.O.:400] Out: 2 [Urine:2] Physical Exam: General: Alert, awake, oriented x3, in no acute distress. HEENT: No bruits, no goiter. Heart: Regular rate and rhythm, without murmurs, rubs, gallops. Lungs: Clear to auscultation bilaterally, decreased breath sounds at bases Abdomen: Soft, nontender, nondistended, positive bowel sounds. Extremities: No clubbing cyanosis or edema with positive pedal pulses. Neuro: Grossly intact, nonfocal.  Lab Results: Basic Metabolic Panel:  Basename 12/20/11 0019 12/19/11 0222  NA 141 144  K 3.4* 3.5  CL 106 111  CO2 26 26  GLUCOSE 101* 108*  BUN 13 12  CREATININE 1.09 0.84  CALCIUM 8.6 8.4  MG -- --  PHOS -- --   Liver Function Tests:  Basename 12/19/11 0222 12/18/11 1303  AST 10 13  ALT 10 12  ALKPHOS 55 66  BILITOT 0.5 0.6  PROT 5.2* 6.4  ALBUMIN 2.9* 3.4*   No results found for this basename: LIPASE:2,AMYLASE:2 in the last 72 hours No results found for this basename: AMMONIA:2 in the last 72 hours CBC:  Basename 12/20/11 0019 12/19/11 0222 12/18/11 1303  WBC 4.9 4.3 --  NEUTROABS -- -- 3.5  HGB 9.7* 9.4* --  HCT 30.9* 30.4* --  MCV 93.1 92.4 --  PLT 241 226 --   Cardiac Enzymes: No results found for this basename: CKTOTAL:3,CKMB:3,CKMBINDEX:3,TROPONINI:3 in the last 72 hours BNP:  Basename 12/18/11 1306  PROBNP 5261.0*   D-Dimer: No results found for this basename: DDIMER:2 in the last 72  hours CBG: No results found for this basename: GLUCAP:6 in the last 72 hours Hemoglobin A1C: No results found for this basename: HGBA1C in the last 72 hours Fasting Lipid Panel: No results found for this basename: CHOL,HDL,LDLCALC,TRIG,CHOLHDL,LDLDIRECT in the last 72 hours Thyroid Function Tests: No results found for this basename: TSH,T4TOTAL,FREET4,T3FREE,THYROIDAB in the last 72 hours Anemia Panel: No results found for this basename: VITAMINB12,FOLATE,FERRITIN,TIBC,IRON,RETICCTPCT in the last 72 hours Coagulation:  Basename 12/20/11 0019 12/19/11 0222  LABPROT 15.2 15.4*  INR 1.18 1.19   Urine Drug Screen: Drugs of Abuse  No results found for this basename: labopia,  cocainscrnur,  labbenz,  amphetmu,  thcu,  labbarb    Alcohol Level: No results found for this basename: ETH:2 in the last 72 hours  No results found for this or any previous visit (from the past 240 hour(s)).  Studies/Results: Ct Angio Chest W/cm &/or Wo Cm  12/18/2011  *RADIOLOGY REPORT*  Clinical Data:  78 year old female with right back pain, productive cough, shortness of breath.  CT ANGIOGRAPHY CHEST WITH CONTRAST  Technique:  Multidetector CT imaging of the chest was performed using the standard protocol during bolus administration of intravenous contrast.  Multiplanar CT image reconstructions including MIPs were obtained to evaluate the vascular anatomy.  Contrast:  180 ml Omnipaque-300.  Comparison:  12/02/2011 and earlier.  Findings:  Contrast bolus had to be repeated due to poor timing initially.  Timing on the second bolus is adequate, and there are low density  pulmonary artery filling defects at the right middle lobe and lower lobe (series 11 image 145).  Left lower lobe superior segment level filling defect also noted.  Superimposed chronic pulmonary artery enlargement suggestive of pulmonary artery hypertension.  Chronic cardiomegaly.  Trace pleural effusions.  No pericardial effusion.  Stable visualized  upper abdominal viscera.  No mediastinal lymphadenopathy.  Stable thoracic inlet with multiple thyroid nodules.  Major airways remain patent.  Mildly increased atelectasis.  No pulmonary consolidation.  Recently seen small pulmonary nodules are stable.  Scoliosis.  Stable anterolisthesis of C7-9 T1.  Chronic T8 and T9 compression fractures are stable.  Occasional thoracic vertebral body hemangiomas.  Chronic left tenth rib fracture is stable. No acute osseous abnormality identified.  Review of the MIP images confirms the above findings.  IMPRESSION: 1.  Positive for bilateral lower lobe acute pulmonary emboli. 2.  Underlying cardiomegaly and pulmonary artery hypertension. 3.  Trace pleural effusions.  Mildly increased atelectasis. 4.  Chronic thoracic scoliosis and compression fractures.  Critical Value/emergent results were called by telephone at the time of interpretation on 12/18/2011  at 1603 hours  to  Dr. Lorre Nick, who verbally acknowledged these results.  Original Report Authenticated By: Harley Hallmark, M.D.    Medications: Scheduled Meds:    . sodium chloride   Intravenous STAT  . calcium-vitamin D  2 tablet Oral Daily  . cycloSPORINE  1 drop Both Eyes BID  . digoxin  125 mcg Oral Daily  . docusate sodium  100 mg Oral BID  . furosemide  20 mg Oral Daily  . heparin  1,000 Units Intravenous Once  . hydrochlorothiazide  12.5 mg Oral Daily  . lipase/protease/amylase  1 capsule Oral TID AC  . multivitamin  1 tablet Oral Daily  . nebivolol  2.5 mg Oral Daily  . olmesartan  10 mg Oral Daily  . pantoprazole  40 mg Oral BID AC  . potassium chloride  20 mEq Oral Once  . rifaximin  550 mg Oral TID  . warfarin  3 mg Oral ONCE-1800  . warfarin  5 mg Oral ONCE-1800  . DISCONTD: multivitamin-lutein  1 capsule Oral Daily   Continuous Infusions:    . sodium chloride 10 mL/hr at 12/20/11 1005  . heparin 1,450 Units/hr (12/20/11 0820)  . DISCONTD: heparin 1,250 Units/hr (12/19/11 1459)    PRN Meds:.acetaminophen, acetaminophen, alum & mag hydroxide-simeth, HYDROcodone-acetaminophen, HYDROmorphone, ondansetron (ZOFRAN) IV, ondansetron, oxyCODONE, polyvinyl alcohol, zolpidem  Assessment/Plan: 1. Pulmonary embolism, bilateral: continue IV heparin, start coumadin today Will need to monitor closely for recurrence of GI bleeds, will check hemoccult, and CBC Q12, if this recurs will need IVC filter, continue PPI Risk factor for PE: recent hospitalization, and recent discontinuation of coumadin 2. ANEMIA-IRON DEFICIENCY: Hb stable, continue to monitor 3. Atrial Fibrillation: Rate controlled continue digoxin, coumadin being restarted for #1 4. 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 Chronic rib fracture w/ rib pain: use vicodin PRN, incentive spirometry   LOS: 2 days   [name] [facility] Pager: [phone] 12/20/2011, 3:04 PM

## 2011-12-20 NOTE — Progress Notes (Signed)
Physical Therapy Evaluation Patient Details Name: [name] MRN: [id number] DOB: April 13, 1922 Today's Date: 12/20/2011  Problem List:  Patient Active Problem List  Diagnoses  . VITAMIN B12 DEFICIENCY  . ANEMIA-IRON DEFICIENCY  . ATRIAL FIBRILLATION, CHRONIC  . IRRITABLE BOWEL SYNDROME  . PANCREATIC INSUFFICIENCY  . Osteoarthritis  . Hemorrhage of gastrointestinal tract, unspecified  . Warfarin anticoagulation  . Acute posthemorrhagic anemia  . Pulmonary embolism, bilateral    Past Medical History:  Past Medical History  Diagnosis Date  . Abnormal liver function test   . Osteoarthritis     back and lower extremities, neck  . Campath-induced atrial fibrillation   . IBS (irritable bowel syndrome)   . Pancreatic insufficiency     ? mass on imaging notborne out on follow-up  . Gastric ulcer   . Erosive gastritis 7/10  . Vitamin B12 deficiency   . Iron deficiency anemia   . Compression fracture of C-spine    Past Surgical History:  Past Surgical History  Procedure Date  . Tubal ligation   . Back surgery   . Left knee surgery   . Hernia repair   . Carpal tunnel release     right  . Wrist surgery     right plate  . Bladder surgery     tact  . Ovarian cyst removal   . Esophagogastroduodenoscopy 06/2009    erosive gastritis, small ulcer, hiatal hernia (negative biopsies)  . Colonoscopy 08/2009    diverticulosis, internal hemorrhoids  . Upper gastrointestinal endoscopy 05/30/11    normal  . Colonoscopy 06/02/11    Diverticulosis  . Capsule endoscopy 06/04/11    normal  . Esophagogastroduodenoscopy 11/29/2011    Procedure: ESOPHAGOGASTRODUODENOSCOPY (EGD);  Surgeon: Louis Meckel, MD;  Location: [facility];  Service: Endoscopy;  Laterality: N/A;  . Givens capsule study 11/29/2011    Procedure: GIVENS CAPSULE STUDY;  Surgeon: Louis Meckel, MD;  Location: [facility];  Service: Endoscopy;  Laterality: N/A;  . Enteroscopy 12/02/2011    Procedure: ENTEROSCOPY;   Surgeon: Louis Meckel, MD;  Location: [facility];  Service: Endoscopy;  Laterality: N/A;  . Colonoscopy 12/03/2011    Procedure: COLONOSCOPY;  Surgeon: Louis Meckel, MD;  Location: [facility];  Service: Endoscopy;  Laterality: N/A;  . Colonoscopy 12/04/2011    Procedure: COLONOSCOPY;  Surgeon: Rob Bunting, MD;  Location: [facility];  Service: Endoscopy;  Laterality: N/A;    PT Assessment/Plan/Recommendation PT Assessment Clinical Impression Statement: pt is an 78 y/o female adm with SOB on exertion determined to be caused by a PE.  Steadily improving but still significantly weak and with decr activity tolerance.  Unless she can find 24 hour S, she may need ST SNF for rehab PT Recommendation/Assessment: Patient will need skilled PT in the acute care venue PT Problem List: Decreased strength;Decreased activity tolerance;Decreased balance;Decreased mobility;Cardiopulmonary status limiting activity Barriers to Discharge: Decreased caregiver support PT Therapy Diagnosis : Generalized weakness PT Plan PT Frequency: Min 3X/week PT Treatment/Interventions: Gait training;Functional mobility training;Therapeutic activities;Therapeutic exercise;Balance training;Patient/family education PT Recommendation Follow Up Recommendations: Skilled nursing facility (or HHPT with 24 hour A) Equipment Recommended: None recommended by PT PT Goals  Acute Rehab PT Goals PT Goal Formulation: With patient Time For Goal Achievement: 7 days Pt will go Sit to Stand: with modified independence PT Goal: Sit to Stand - Progress: Not met Pt will Transfer Bed to Chair/Chair to Bed: with modified independence PT Transfer Goal: Bed to Chair/Chair to Bed - Progress: Not met Pt  will Ambulate: >150 feet;with modified independence;with least restrictive assistive device PT Goal: Ambulate - Progress: Not met Pt will Go Up / Down Stairs: 1-2 stairs;with modified independence;with least restrictive assistive  device PT Goal: Up/Down Stairs - Progress: Not met  PT Evaluation Precautions/Restrictions    Prior Functioning  Home Living Lives With: Alone Receives Help From: Family Type of Home: House Home Layout: Two level;Able to live on main level with bedroom/bathroom Home Access: Stairs to enter Entrance Stairs-Rails: None (can reach walls) Entrance Stairs-Number of Steps: 2 Home Adaptive Equipment: Straight cane Prior Function Level of Independence: Independent with basic ADLs;Independent with homemaking with ambulation;Independent with homemaking with wheelchair;Independent with gait;Independent with transfers Able to Take Stairs?: Yes Driving: Yes Vocation: Part time employment Cognition Cognition Arousal/Alertness: Awake/alert Overall Cognitive Status: Appears within functional limits for tasks assessed Orientation Level: Oriented X4 Sensation/Coordination Coordination Gross Motor Movements are Fluid and Coordinated: Yes Fine Motor Movements are Fluid and Coordinated: Not tested Extremity Assessment RUE Assessment RUE Assessment: Within Functional Limits LUE Assessment LUE Assessment: Within Functional Limits RLE Assessment RLE Assessment: Within Functional Limits LLE Assessment LLE Assessment: Within Functional Limits (generally weak at >=3+/5 Bil) Mobility (including Balance) Bed Mobility Bed Mobility: Yes Supine to Sit: 7: Independent Transfers Transfers: Yes Sit to Stand: 5: Supervision Stand to Sit: 5: Supervision Ambulation/Gait Ambulation/Gait: Yes Ambulation/Gait Assistance: Other (comment) (min guard A) Ambulation Distance (Feet): 140 Feet Assistive device: Other (Comment) (pusing IV pole) Gait Pattern: Decreased step length - left;Trendelenburg  Posture/Postural Control Posture/Postural Control: No significant limitations Balance Balance Assessed:  (mildly unsteady during standing and gait) Exercise    End of Session PT - End of Session Activity  Tolerance: Patient tolerated treatment well;Patient limited by pain Patient left: with call bell in reach;Other (comment) (sitting on the bed) Nurse Communication: Mobility status for ambulation General Behavior During Session: [facility] for tasks performed Cognition: [facility] for tasks performed  Shaquia Berkley, Eliseo Gum 12/20/2011, 4:08 PM  12/20/2011  [place] Bing, PT [phone] (pager)

## 2011-12-20 NOTE — Telephone Encounter (Signed)
Patient is admitted to the hospital for bilateral pulmonary emboli she is admitted to [facility] hospital.  She needs to cancel her B12.  She will call back if she can't keep her appt with Dr Leone Payor on 12/26/11

## 2011-12-20 NOTE — Progress Notes (Signed)
Utilization Review Completed.[name] T1/01/2012   

## 2011-12-20 NOTE — Progress Notes (Signed)
ANTICOAGULATION CONSULT NOTE - Follow Up Consult  Pharmacy Consult for heparin/Coumadin Indication: pulmonary embolus (also hx afib, off Coumadin due to GI bleeding earlier this month)   Allergies  Allergen Reactions  . Meperidine Hcl     REACTION: nausea: VOMIT  . Morphine Sulfate     REACTION: nausea: "VOMIT"    Patient Measurements: Height: 5\' 6"  (167.6 cm) Weight: 178 lb 5.6 oz (80.9 kg) IBW/kg (Calculated) : 59.3  Adjusted Body Weight:  Vital Signs: Temp: 98.2 F (36.8 C) (01/01 2100) Temp src: Oral (01/01 2100) BP: 96/55 mmHg (01/01 2100) Pulse Rate: 55  (01/01 2100)  Labs:  Basename 12/20/11 0019 12/19/11 1440 12/19/11 0222 12/18/11 1303  HGB 9.7* -- 9.4* --  HCT 30.9* -- 30.4* 34.5*  PLT 241 -- 226 231  APTT -- -- -- --  LABPROT 15.2 -- 15.4* --  INR 1.18 -- 1.19 --  HEPARINUNFRC 0.49 0.17* 0.24* --  CREATININE 1.09 -- 0.84 0.89  CKTOTAL -- -- -- --  CKMB -- -- -- --  TROPONINI -- -- -- --   Estimated Creatinine Clearance: 39 ml/min (by C-G formula based on Cr of 1.09).  Assessment: 78 yo female with hx afib, new PE for Heparin and Coumadin  Goal of Therapy:  Heparin level 0.3-0.7 units/ml INR 2-3   Plan:  Continue Heparin at current rate. Coumadin 5 mg tonight  Eddie Candle 12/20/2011,2:34 AM

## 2011-12-21 LAB — CBC
HCT: 30.6 % — ABNORMAL LOW (ref 36.0–46.0)
MCH: 28.9 pg (ref 26.0–34.0)
MCHC: 31.8 g/dL (ref 30.0–36.0)
MCV: 91.8 fL (ref 78.0–100.0)
Platelets: 248 10*3/uL (ref 150–400)
Platelets: 288 10*3/uL (ref 150–400)
Platelets: 240 10*3/uL (ref 150–400)
RBC: 3.7 MIL/uL — ABNORMAL LOW (ref 3.87–5.11)
RDW: 15 % (ref 11.5–15.5)
RDW: 14.8 % (ref 11.5–15.5)
WBC: 5 10*3/uL (ref 4.0–10.5)
WBC: 4.4 10*3/uL (ref 4.0–10.5)

## 2011-12-21 LAB — BASIC METABOLIC PANEL
Calcium: 9.2 mg/dL (ref 8.4–10.5)
GFR calc Af Amer: 52 mL/min — ABNORMAL LOW (ref >90)
GFR calc non Af Amer: 45 mL/min — ABNORMAL LOW (ref >90)
Glucose, Bld: 95 mg/dL (ref 70–99)
Sodium: 141 mEq/L (ref 135–145)

## 2011-12-21 LAB — PROTIME-INR: Prothrombin Time: 15 seconds (ref 11.6–15.2)

## 2011-12-21 LAB — HEPARIN LEVEL (UNFRACTIONATED): Heparin Unfractionated: 0.43 IU/mL (ref 0.30–0.70)

## 2011-12-21 MED ORDER — WARFARIN SODIUM 5 MG PO TABS
5.0000 mg | ORAL_TABLET | Freq: Once | ORAL | Status: AC
  Administered 2011-12-21: 5 mg via ORAL
  Filled 2011-12-21: qty 1

## 2011-12-21 MED ORDER — SENNOSIDES-DOCUSATE SODIUM 8.6-50 MG PO TABS
2.0000 | ORAL_TABLET | Freq: Every day | ORAL | Status: DC
  Administered 2011-12-21 – 2011-12-23 (×3): 2 via ORAL
  Filled 2011-12-21 (×5): qty 2

## 2011-12-21 MED ORDER — POLYETHYLENE GLYCOL 3350 17 G PO PACK
17.0000 g | PACK | Freq: Every day | ORAL | Status: DC | PRN
  Filled 2011-12-21: qty 1

## 2011-12-21 NOTE — Progress Notes (Signed)
Clinical Child psychotherapist (CSW) completed psychosocial assessment which can be found in pt shadow chart. CSW discussed placement options for pt as PT has recommend ST skilled nursing facility if 24hour care not available. CSW explained process to pt and risks associated with going home without supervision. Pt stated she lives home alone and desires to return home however understands recommendations. Pt agreed to be faxed out to skilled nursing facilities in [facility] and stated she would speak to son and daughter today regarding placement. Pt states she will make a decision before the end of the day. CSW will continue to follow.  Theresia Bough, MSW, Theresia Majors [phone]

## 2011-12-21 NOTE — Progress Notes (Signed)
   CARE MANAGEMENT NOTE 12/21/2011  Patient:  [name], [name]   Account Number:  [id number]  Date Initiated:  12/21/2011  Documentation initiated by:  Junius Creamer  Subjective/Objective Assessment:   adm w pul embolus     Action/Plan:   lives alone, pcp dr Thalia Party mcginnis   Anticipated DC Date:  12/24/2011   Anticipated DC Plan:  SKILLED NURSING FACILITY  In-house referral  Clinical Social Worker      DC Planning Services  CM consult      Choice offered to / List presented to:             Status of service:   Medicare Important Message given?   (If response is "NO", the following Medicare IM given date fields will be blank) Date Medicare IM given:   Date Additional Medicare IM given:    Discharge Disposition:    Per UR Regulation:  Reviewed for med. necessity/level of care/duration of stay  Comments:  1/3 phy ther rec sh term snf or 24hr care. sw ref made. debbie Hazelle Woollard rn,bsn [id number]

## 2011-12-21 NOTE — Progress Notes (Signed)
ANTICOAGULATION CONSULT NOTE - Follow Up Consult  Pharmacy Consult for heparin + coumadin Indication: pulmonary embolus (also hx of afib, off coumadin d/t GIB earlier this month)  Vital Signs: Temp: 97.1 F (36.2 C) (01/03 1300) Temp src: Oral (01/03 0622) BP: 90/54 mmHg (01/03 1300) Pulse Rate: 60  (01/03 0936)  Labs:  Basename 12/21/11 1003 12/21/11 0440 12/20/11 2119 12/20/11 0019 12/19/11 1440 12/19/11 0222  HGB 10.7* 9.8* -- -- -- --  HCT 33.7* 31.5* 32.8* -- -- --  PLT 288 248 262 -- -- --  APTT -- -- -- -- -- --  LABPROT -- 15.0 -- 15.2 -- 15.4*  INR -- 1.16 -- 1.18 -- 1.19  HEPARINUNFRC -- 0.43 -- 0.49 0.17* --  CREATININE -- 1.08 -- 1.09 -- 0.84  CKTOTAL -- -- -- -- -- --  CKMB -- -- -- -- -- --  TROPONINI -- -- -- -- -- --   Estimated Creatinine Clearance: 39.3 ml/min (by C-G formula based on Cr of 1.08).  Assessment: 63 yof with history of afib and new PE on IV heparin and coumadin. INR has actually decreased but heparin is therapeutic at 0.43. No bleeding or other problems noted. However noted with dark stool yesterday per MD.   Goal of Therapy:  INR 2-3 Heparin level 0.3-0.7 units/ml   Plan:  Repeat coumadin 5mg  PO x 1 tonight Continue heparin at 1450units/hr F/u AM INR and heparin level  [name], [name] 12/21/2011,1:39 PM

## 2011-12-21 NOTE — Progress Notes (Signed)
Subjective: Breathing continues to improve No BM yet, she continues to get better Ambulating some with physical therapy  Objective: Vital signs in last 24 hours: Temp:  [97.1 F (36.2 C)-97.9 F (36.6 C)] 97.1 F (36.2 C) (01/03 1300) Pulse Rate:  [52-60] 60  (01/03 0936) Resp:  [16-18] 18  (01/03 1300) BP: (90-117)/(54-64) 90/54 mmHg (01/03 1300) SpO2:  [90 %-96 %] 96 % (01/03 1300) Weight change:  Last BM Date: 12/20/11  Intake/Output from previous day: 01/02 0701 - 01/03 0700 In: 760 [P.O.:760] Out: 2 [Urine:2] Total I/O In: 600 [P.O.:600] Out: -  Physical Exam: General: Alert, awake, oriented x3, in no acute distress. HEENT: No bruits, no goiter. Heart: Regular rate and rhythm, without murmurs, rubs, gallops. Lungs: Clear to auscultation bilaterally, decreased breath sounds at bases Abdomen: Soft, nontender, nondistended, positive bowel sounds. Extremities: No clubbing cyanosis or edema with positive pedal pulses. Neuro: Grossly intact, nonfocal.  Lab Results: Basic Metabolic Panel:  Basename 12/21/11 0440 12/20/11 0019  NA 141 141  K 3.6 3.4*  CL 104 106  CO2 29 26  GLUCOSE 95 101*  BUN 14 13  CREATININE 1.08 1.09  CALCIUM 9.2 8.6  MG -- --  PHOS -- --   Liver Function Tests:  Basename 12/19/11 0222  AST 10  ALT 10  ALKPHOS 55  BILITOT 0.5  PROT 5.2*  ALBUMIN 2.9*   No results found for this basename: LIPASE:2,AMYLASE:2 in the last 72 hours No results found for this basename: AMMONIA:2 in the last 72 hours CBC:  Basename 12/21/11 1003 12/21/11 0440  WBC 5.8 5.0  NEUTROABS -- --  HGB 10.7* 9.8*  HCT 33.7* 31.5*  MCV 91.1 91.8  PLT 288 248   Cardiac Enzymes: No results found for this basename: CKTOTAL:3,CKMB:3,CKMBINDEX:3,TROPONINI:3 in the last 72 hours BNP: No results found for this basename: PROBNP:3 in the last 72 hours D-Dimer: No results found for this basename: DDIMER:2 in the last 72 hours CBG: No results found for this  basename: GLUCAP:6 in the last 72 hours Hemoglobin A1C: No results found for this basename: HGBA1C in the last 72 hours Fasting Lipid Panel: No results found for this basename: CHOL,HDL,LDLCALC,TRIG,CHOLHDL,LDLDIRECT in the last 72 hours Thyroid Function Tests: No results found for this basename: TSH,T4TOTAL,FREET4,T3FREE,THYROIDAB in the last 72 hours Anemia Panel: No results found for this basename: VITAMINB12,FOLATE,FERRITIN,TIBC,IRON,RETICCTPCT in the last 72 hours Coagulation:  Basename 12/21/11 0440 12/20/11 0019  LABPROT 15.0 15.2  INR 1.16 1.18   Urine Drug Screen: Drugs of Abuse  No results found for this basename: labopia,  cocainscrnur,  labbenz,  amphetmu,  thcu,  labbarb    Alcohol Level: No results found for this basename: ETH:2 in the last 72 hours  No results found for this or any previous visit (from the past 240 hour(s)).  Studies/Results: No results found.  Medications: Scheduled Meds:    . calcium-vitamin D  2 tablet Oral Daily  . cycloSPORINE  1 drop Both Eyes BID  . digoxin  125 mcg Oral Daily  . docusate sodium  100 mg Oral BID  . furosemide  20 mg Oral Daily  . hydrochlorothiazide  12.5 mg Oral Daily  . lipase/protease/amylase  1 capsule Oral TID AC  . multivitamin  1 tablet Oral Daily  . nebivolol  2.5 mg Oral Daily  . olmesartan  10 mg Oral Daily  . pantoprazole  40 mg Oral BID AC  . rifaximin  550 mg Oral TID  . warfarin  5 mg Oral  ONCE-1800  . warfarin  5 mg Oral ONCE-1800   Continuous Infusions:    . sodium chloride 10 mL/hr at 12/20/11 1005  . heparin 1,450 Units/hr (12/21/11 0318)   PRN Meds:.acetaminophen, acetaminophen, alum & mag hydroxide-simeth, HYDROcodone-acetaminophen, HYDROmorphone, ondansetron (ZOFRAN) IV, ondansetron, oxyCODONE, polyvinyl alcohol, zolpidem  Assessment/Plan: 1. Pulmonary embolism, bilateral: continue IV heparin,day 3 of bridging with heparin /coumadin Will need to monitor closely for recurrence of GI  bleeds, will check hemoccult, and CBC Q12, if this recurs will need IVC filter, continue PPI Risk factor for PE: recent hospitalization, and recent discontinuation of coumadin 2. ANEMIA-IRON DEFICIENCY: Hb stable, continue to monitor 3. Atrial Fibrillation: Rate controlled continue digoxin, coumadin being restarted for #1 4. 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 Chronic rib fracture w/ rib pain: use vicodin PRN, incentive spirometry 6. Disposition: SNF vs home with son and home health   LOS: 3 days   [facility] Pager: [phone] 12/21/2011, 4:30 PM

## 2011-12-21 NOTE — Progress Notes (Signed)
Physical Therapy Treatment Patient Details Name: [name] MRN: [id number] DOB: 10-11-1922 Today's Date: 12/21/2011  PT Assessment/Plan  PT - Assessment/Plan Comments on Treatment Session: Pt and family had several questions reguarding SNF vs. HHPT. Continue to recommend SNF vs. HHPT with 24hr supervision PT Plan: Discharge plan remains appropriate PT Frequency: Min 3X/week Follow Up Recommendations: Skilled nursing facility;Other (comment) (or HHPT with 24 supervision) Equipment Recommended: None recommended by PT PT Goals  Acute Rehab PT Goals PT Goal: Sit to Stand - Progress: Met PT Transfer Goal: Bed to Chair/Chair to Bed - Progress: Progressing toward goal PT Goal: Ambulate - Progress: Progressing toward goal  PT Treatment Precautions/Restrictions    Mobility (including Balance) Transfers Sit to Stand: 6: Modified independent (Device/Increase time) Stand to Sit: 6: Modified independent (Device/Increase time) Ambulation/Gait Ambulation/Gait Assistance: 4: Min assist;Other (comment) (MinGuard A) Ambulation/Gait Assistance Details (indicate cue type and reason): Pt. will slight unsteadiness with gait. No LOB. Recommend using RW next session Ambulation Distance (Feet): 150 Feet Assistive device:  (IVpole) Gait Pattern: Trendelenburg    Exercise    End of Session PT - End of Session Equipment Utilized During Treatment: Gait belt Activity Tolerance: Patient tolerated treatment well Patient left: in chair;with family/visitor present General Behavior During Session: [facility] for tasks performed Cognition: [facility] for tasks performed  Shellie Rogoff, Adline Potter 12/21/2011, 3:02 PM  12/21/2011 Fredrich Birks PTA [phone] pager [phone] office

## 2011-12-22 LAB — CBC
Platelets: 263 10*3/uL (ref 150–400)
Platelets: 272 10*3/uL (ref 150–400)
RBC: 3.49 MIL/uL — ABNORMAL LOW (ref 3.87–5.11)
RBC: 3.64 MIL/uL — ABNORMAL LOW (ref 3.87–5.11)
WBC: 4.5 10*3/uL (ref 4.0–10.5)
WBC: 4 10*3/uL (ref 4.0–10.5)

## 2011-12-22 LAB — PROTIME-INR
INR: 1.38 (ref 0.00–1.49)
Prothrombin Time: 17.2 seconds — ABNORMAL HIGH (ref 11.6–15.2)

## 2011-12-22 LAB — OCCULT BLOOD X 1 CARD TO LAB, STOOL: Fecal Occult Bld: NEGATIVE

## 2011-12-22 MED ORDER — CYANOCOBALAMIN 1000 MCG/ML IJ SOLN
1000.0000 ug | Freq: Once | INTRAMUSCULAR | Status: AC
  Administered 2011-12-22: 1000 ug via INTRAMUSCULAR
  Filled 2011-12-22 (×2): qty 1

## 2011-12-22 MED ORDER — HEPARIN SOD (PORCINE) IN D5W 100 UNIT/ML IV SOLN
1550.0000 [IU]/h | INTRAVENOUS | Status: DC
  Administered 2011-12-22 – 2011-12-23 (×2): 1550 [IU]/h via INTRAVENOUS
  Filled 2011-12-22 (×2): qty 250

## 2011-12-22 MED ORDER — WARFARIN SODIUM 6 MG PO TABS
6.0000 mg | ORAL_TABLET | Freq: Once | ORAL | Status: AC
  Administered 2011-12-22: 6 mg via ORAL
  Filled 2011-12-22: qty 1

## 2011-12-22 NOTE — Progress Notes (Signed)
ANTICOAGULATION CONSULT NOTE - Follow Up Consult  Pharmacy Consult for heparin + coumadin Indication: pulmonary embolus (also hx of afib, off coumadin d/t GIB earlier this month)  Vital Signs: Temp: 98.4 F (36.9 C) (01/04 0500) Temp src: Oral (01/04 0500) BP: 101/63 mmHg (01/04 0500) Pulse Rate: 73  (01/04 0500)  Labs:  Basename 12/22/11 0514 12/21/11 2213 12/21/11 1003 12/21/11 0440 12/20/11 0019  HGB 9.9* 9.9* -- -- --  HCT 31.6* 30.6* 33.7* -- --  PLT 263 240 288 -- --  APTT -- -- -- -- --  LABPROT 17.2* -- -- 15.0 15.2  INR 1.38 -- -- 1.16 1.18  HEPARINUNFRC 0.29* -- -- 0.43 0.49  CREATININE -- -- -- 1.08 1.09  CKTOTAL -- -- -- -- --  CKMB -- -- -- -- --  TROPONINI -- -- -- -- --   Estimated Creatinine Clearance: 39.3 ml/min (by C-G formula based on Cr of 1.08).  Assessment: 22 yof with history of afib and new PE on IV heparin and coumadin. INR with slight increase to 1.38 and heparin just below goal at 0.29. Patient has been at goal INR on 2-3 mg/day in December of 2012.  Goal of Therapy:  INR 2-3 Heparin level 0.3-0.7 units/ml   Plan:  - Coumadin 6mg  today (anticipate INR increase and hesitant to increase more due to recent GIB) - Increase heparin to 1550units/hr -F/u AM INR and heparin level  Benny Lennert 12/22/2011,8:51 AM

## 2011-12-22 NOTE — Progress Notes (Signed)
Medications have been tolerated thus far. Per VAST Team IV resite (22 gauge LT Lateral Wrist).  IV tubing changed and currently Heparin is infusing with NS KVO.  Patient has ambulated to bathroom and back to bed successfully however she does appear to get SOB.  o2 @ 2L Winter.  Patient denies pain at this time.  No other verbal complaints or request mentioned by patient.  Will continue to monitor patient during the course of the night.  Call bell in reach and side rails are in an upright position.

## 2011-12-22 NOTE — Progress Notes (Signed)
Subjective: Breathing continues to improve Had a BM which was normal  Objective: Vital signs in last 24 hours: Temp:  [97.7 F (36.5 C)-98.4 F (36.9 C)] 97.9 F (36.6 C) (01/04 1300) Pulse Rate:  [55-77] 55  (01/04 1300) Resp:  [16-20] 16  (01/04 1300) BP: (90-107)/(46-63) 98/62 mmHg (01/04 1429) SpO2:  [91 %-93 %] 91 % (01/04 1300) Weight change:  Last BM Date: 12/19/11  Intake/Output from previous day: 01/03 0701 - 01/04 0700 In: 600 [P.O.:600] Out: -  Total I/O In: 240 [P.O.:240] Out: -  Physical Exam: General: Alert, awake, oriented x3, in no acute distress. HEENT: No bruits, no goiter. Heart: Regular rate and rhythm, without murmurs, rubs, gallops. Lungs: Clear to auscultation bilaterally, decreased breath sounds at bases Abdomen: Soft, nontender, nondistended, positive bowel sounds. Extremities: No clubbing cyanosis or edema with positive pedal pulses. Neuro: Grossly intact, nonfocal.  Lab Results: Basic Metabolic Panel:  Basename 12/21/11 0440 12/20/11 0019  NA 141 141  K 3.6 3.4*  CL 104 106  CO2 29 26  GLUCOSE 95 101*  BUN 14 13  CREATININE 1.08 1.09  CALCIUM 9.2 8.6  MG -- --  PHOS -- --   Liver Function Tests: No results found for this basename: AST:2,ALT:2,ALKPHOS:2,BILITOT:2,PROT:2,ALBUMIN:2 in the last 72 hours No results found for this basename: LIPASE:2,AMYLASE:2 in the last 72 hours No results found for this basename: AMMONIA:2 in the last 72 hours CBC:  Basename 12/22/11 0831 12/22/11 0514  WBC 4.0 4.5  NEUTROABS -- --  HGB 10.6* 9.9*  HCT 33.2* 31.6*  MCV 91.2 90.5  PLT 272 263   Cardiac Enzymes: No results found for this basename: CKTOTAL:3,CKMB:3,CKMBINDEX:3,TROPONINI:3 in the last 72 hours BNP: No results found for this basename: PROBNP:3 in the last 72 hours D-Dimer: No results found for this basename: DDIMER:2 in the last 72 hours CBG: No results found for this basename: GLUCAP:6 in the last 72 hours Hemoglobin A1C: No  results found for this basename: HGBA1C in the last 72 hours Fasting Lipid Panel: No results found for this basename: CHOL,HDL,LDLCALC,TRIG,CHOLHDL,LDLDIRECT in the last 72 hours Thyroid Function Tests: No results found for this basename: TSH,T4TOTAL,FREET4,T3FREE,THYROIDAB in the last 72 hours Anemia Panel: No results found for this basename: VITAMINB12,FOLATE,FERRITIN,TIBC,IRON,RETICCTPCT in the last 72 hours Coagulation:  Basename 12/22/11 0514 12/21/11 0440  LABPROT 17.2* 15.0  INR 1.38 1.16   Urine Drug Screen: Drugs of Abuse  No results found for this basename: labopia,  cocainscrnur,  labbenz,  amphetmu,  thcu,  labbarb    Alcohol Level: No results found for this basename: ETH:2 in the last 72 hours  No results found for this or any previous visit (from the past 240 hour(s)).  Studies/Results: No results found.  Medications: Scheduled Meds:    . calcium-vitamin D  2 tablet Oral Daily  . cyanocobalamin  1,000 mcg Intramuscular Once  . cycloSPORINE  1 drop Both Eyes BID  . digoxin  125 mcg Oral Daily  . lipase/protease/amylase  1 capsule Oral TID AC  . multivitamin  1 tablet Oral Daily  . nebivolol  2.5 mg Oral Daily  . pantoprazole  40 mg Oral BID AC  . senna-docusate  2 tablet Oral QHS  . warfarin  5 mg Oral ONCE-1800  . warfarin  6 mg Oral ONCE-1800  . DISCONTD: docusate sodium  100 mg Oral BID  . DISCONTD: furosemide  20 mg Oral Daily  . DISCONTD: hydrochlorothiazide  12.5 mg Oral Daily  . DISCONTD: olmesartan  10 mg  Oral Daily  . DISCONTD: rifaximin  550 mg Oral TID   Continuous Infusions:    . sodium chloride 10 mL/hr at 12/20/11 1005  . heparin 1,550 Units/hr (12/22/11 1421)  . DISCONTD: heparin 1,450 Units/hr (12/21/11 2041)   PRN Meds:.acetaminophen, acetaminophen, alum & mag hydroxide-simeth, HYDROcodone-acetaminophen, HYDROmorphone, ondansetron (ZOFRAN) IV, ondansetron, oxyCODONE, polyethylene glycol, polyvinyl alcohol,  zolpidem  Assessment/Plan: 1. Pulmonary embolism, bilateral: continue IV heparin,day 4 of bridging with heparin /coumadin Will need to monitor closely for recurrence of GI bleeds, will check hemoccult, and CBC Q12, if this recurs will need IVC filter, continue PPI Risk factor for PE: recent hospitalization, and recent discontinuation of coumadin 2. ANEMIA-IRON DEFICIENCY: Hb stable, continue to monitor 3. Atrial Fibrillation: Rate controlled continue digoxin, coumadin being restarted for #1 4. 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 Chronic rib fracture w/ rib pain: use vicodin PRN, incentive spirometry 6. HTN: BP running on softer side, will DC HCTZ/Losartan 6. Disposition: SNF monday  LOS: 4 days   [name] [facility] Pager: [phone] 12/22/2011, 2:37 PM

## 2011-12-22 NOTE — Progress Notes (Signed)
Shift report received from [facility].  Patient resting in bed, o2 [place] @ 2L.  IV Heparin currently infusing with NS KVO.  Patient is Alert and Oriented X 4.  She does not appear to be in any distress at this time (Cardiac or Respiratory).  Skin is intact however fragile in appearance. Hearing Aid to left ear.  No other verbal complaints or requests mentioned by patient.  Call bell is in reach and side rails are in an upright position.  Will continue to monitor patient during the course of the night.

## 2011-12-23 LAB — CBC
MCH: 29.4 pg (ref 26.0–34.0)
Platelets: 258 10*3/uL (ref 150–400)
RBC: 3.4 MIL/uL — ABNORMAL LOW (ref 3.87–5.11)
WBC: 3.6 10*3/uL — ABNORMAL LOW (ref 4.0–10.5)

## 2011-12-23 LAB — PROTIME-INR: Prothrombin Time: 22.7 seconds — ABNORMAL HIGH (ref 11.6–15.2)

## 2011-12-23 MED ORDER — HEPARIN SOD (PORCINE) IN D5W 100 UNIT/ML IV SOLN
1650.0000 [IU]/h | INTRAVENOUS | Status: DC
  Administered 2011-12-24: 1650 [IU]/h via INTRAVENOUS
  Filled 2011-12-23 (×2): qty 250

## 2011-12-23 MED ORDER — WARFARIN SODIUM 2.5 MG PO TABS
2.5000 mg | ORAL_TABLET | Freq: Once | ORAL | Status: AC
  Administered 2011-12-23: 2.5 mg via ORAL
  Filled 2011-12-23: qty 1

## 2011-12-23 NOTE — Progress Notes (Signed)
Subjective: Breathing continues to improve Had a BM which was normal  Objective: Vital signs in last 24 hours: Temp:  [97.9 F (36.6 C)-98.4 F (36.9 C)] 98.4 F (36.9 C) (01/05 0610) Pulse Rate:  [66-67] 66  (01/05 0610) Resp:  [20] 20  (01/05 0610) BP: (98-108)/(53-64) 105/53 mmHg (01/05 0610) SpO2:  [92 %-93 %] 92 % (01/05 0610) Weight change:  Last BM Date: 12/22/11  Intake/Output from previous day: 01/04 0701 - 01/05 0700 In: 240 [P.O.:240] Out: -    Physical Exam: General: Alert, awake, oriented x3, in no acute distress. HEENT: No bruits, no goiter. Heart: Regular rate and rhythm, without murmurs, rubs, gallops. Lungs: Clear to auscultation bilaterally, decreased breath sounds at bases Abdomen: Soft, nontender, nondistended, positive bowel sounds. Extremities: No clubbing cyanosis or edema with positive pedal pulses. Neuro: Grossly intact, nonfocal.  Lab Results: Basic Metabolic Panel:  Basename 12/21/11 0440  NA 141  K 3.6  CL 104  CO2 29  GLUCOSE 95  BUN 14  CREATININE 1.08  CALCIUM 9.2  MG --  PHOS --   Liver Function Tests: No results found for this basename: AST:2,ALT:2,ALKPHOS:2,BILITOT:2,PROT:2,ALBUMIN:2 in the last 72 hours No results found for this basename: LIPASE:2,AMYLASE:2 in the last 72 hours No results found for this basename: AMMONIA:2 in the last 72 hours CBC:  Basename 12/23/11 0545 12/22/11 0831  WBC 3.6* 4.0  NEUTROABS -- --  HGB 10.0* 10.6*  HCT 31.1* 33.2*  MCV 91.5 91.2  PLT 258 272   Cardiac Enzymes: No results found for this basename: CKTOTAL:3,CKMB:3,CKMBINDEX:3,TROPONINI:3 in the last 72 hours BNP: No results found for this basename: PROBNP:3 in the last 72 hours D-Dimer: No results found for this basename: DDIMER:2 in the last 72 hours CBG: No results found for this basename: GLUCAP:6 in the last 72 hours Hemoglobin A1C: No results found for this basename: HGBA1C in the last 72 hours Fasting Lipid Panel: No  results found for this basename: CHOL,HDL,LDLCALC,TRIG,CHOLHDL,LDLDIRECT in the last 72 hours Thyroid Function Tests: No results found for this basename: TSH,T4TOTAL,FREET4,T3FREE,THYROIDAB in the last 72 hours Anemia Panel: No results found for this basename: VITAMINB12,FOLATE,FERRITIN,TIBC,IRON,RETICCTPCT in the last 72 hours Coagulation:  Basename 12/23/11 0545 12/22/11 0514  LABPROT 22.7* 17.2*  INR 1.96* 1.38   Urine Drug Screen: Drugs of Abuse  No results found for this basename: labopia,  cocainscrnur,  labbenz,  amphetmu,  thcu,  labbarb    Alcohol Level: No results found for this basename: ETH:2 in the last 72 hours  No results found for this or any previous visit (from the past 240 hour(s)).  Studies/Results: No results found.  Medications: Scheduled Meds:    . calcium-vitamin D  2 tablet Oral Daily  . cycloSPORINE  1 drop Both Eyes BID  . digoxin  125 mcg Oral Daily  . lipase/protease/amylase  1 capsule Oral TID AC  . multivitamin  1 tablet Oral Daily  . nebivolol  2.5 mg Oral Daily  . pantoprazole  40 mg Oral BID AC  . senna-docusate  2 tablet Oral QHS  . warfarin  2.5 mg Oral ONCE-1800  . warfarin  6 mg Oral ONCE-1800  . DISCONTD: olmesartan  10 mg Oral Daily   Continuous Infusions:    . sodium chloride 10 mL/hr at 12/20/11 1005  . heparin    . DISCONTD: heparin 1,550 Units/hr (12/23/11 0735)   PRN Meds:.acetaminophen, acetaminophen, alum & mag hydroxide-simeth, HYDROcodone-acetaminophen, HYDROmorphone, ondansetron (ZOFRAN) IV, ondansetron, oxyCODONE, polyethylene glycol, polyvinyl alcohol, zolpidem  Assessment/Plan: 1.  Pulmonary embolism, bilateral: continue IV heparin,day 5 of bridging with heparin /coumadin, stop heparin tomorrow if INR >=2 Continue to monitor closely for recurrence of GI bleeds, hemoccult negative, cbc in am if this recurs will need IVC filter, continue PPI Risk factor for PE: recent hospitalization, and recent discontinuation of  coumadin 2. ANEMIA-IRON DEFICIENCY: Hb stable, continue to monitor 3. Atrial Fibrillation: Rate controlled continue digoxin, coumadin being restarted for #1 4. 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 Chronic rib fracture w/ rib pain: use vicodin PRN, incentive spirometry 6. HTN: BP running on softer side, will DC HCTZ/Losartan 6. Disposition: SNF monday  LOS: 5 days   Drakkar Medeiros [facility] Pager: [phone] 12/23/2011, 1:16 PM

## 2011-12-23 NOTE — Plan of Care (Signed)
Problem: Phase I Progression Outcomes Goal: Other Phase I Outcomes/Goals Outcome: Completed/Met Date Met:  12/23/11 Patient has remained free from injury during the course of the night, room clutter free and intact  Problem: Phase II Progression Outcomes Goal: Vital signs remain stable Outcome: Completed/Met Date Met:  12/23/11 Vital signs have remained stable during the course of the night Goal: Other Phase II Outcomes/Goals Outcome: Completed/Met Date Met:  12/23/11 Patient airway has remained patent during the course of the shift

## 2011-12-23 NOTE — Progress Notes (Signed)
ANTICOAGULATION CONSULT NOTE - Follow Up Consult  Pharmacy Consult for heparin + coumadin Indication: pulmonary embolus (also hx of afib, off coumadin d/t GIB earlier this month)  Vital Signs: Temp: 98.4 F (36.9 C) (01/05 0610) BP: 105/53 mmHg (01/05 0610) Pulse Rate: 66  (01/05 0610)  Labs:  Basename 12/23/11 0545 12/22/11 0831 12/22/11 0514 12/21/11 0440  HGB 10.0* 10.6* -- --  HCT 31.1* 33.2* 31.6* --  PLT 258 272 263 --  APTT -- -- -- --  LABPROT 22.7* -- 17.2* 15.0  INR 1.96* -- 1.38 1.16  HEPARINUNFRC 0.27* -- 0.29* 0.43  CREATININE -- -- -- 1.08  CKTOTAL -- -- -- --  CKMB -- -- -- --  TROPONINI -- -- -- --   Estimated Creatinine Clearance: 39.3 ml/min (by C-G formula based on Cr of 1.08).  Assessment: 70 yof with history of afib and new PE on IV heparin and coumadin. INR with increase of 1.36 to 1.96 and heparin just below goal at 0.27. Patient has been at goal INR on 2-3 mg/day in December of 2012.  Goal of Therapy:  INR 2-3 Heparin level 0.3-0.7 units/ml   Plan:  - Coumadin 2mg  today (anticipate INR increase and hesitant to increase more due to recent GIB) - Increase heparin to 1650units/hr -F/u AM INR and heparin level  Benny Lennert 12/23/2011,12:43 PM

## 2011-12-24 LAB — CBC
HCT: 32 % — ABNORMAL LOW (ref 36.0–46.0)
MCH: 28.7 pg (ref 26.0–34.0)
MCHC: 31.3 g/dL (ref 30.0–36.0)
MCV: 91.7 fL (ref 78.0–100.0)
Platelets: 256 10*3/uL (ref 150–400)
RDW: 14.9 % (ref 11.5–15.5)
WBC: 3.4 10*3/uL — ABNORMAL LOW (ref 4.0–10.5)

## 2011-12-24 MED ORDER — WARFARIN SODIUM 2 MG PO TABS
2.0000 mg | ORAL_TABLET | Freq: Once | ORAL | Status: AC
  Administered 2011-12-24: 2 mg via ORAL
  Filled 2011-12-24: qty 1

## 2011-12-24 MED ORDER — SIMETHICONE 80 MG PO CHEW
80.0000 mg | CHEWABLE_TABLET | Freq: Once | ORAL | Status: AC
  Administered 2011-12-24: 80 mg via ORAL
  Filled 2011-12-24: qty 1

## 2011-12-24 MED ORDER — SIMETHICONE 80 MG PO CHEW
80.0000 mg | CHEWABLE_TABLET | Freq: Four times a day (QID) | ORAL | Status: DC | PRN
  Administered 2011-12-24: 80 mg via ORAL
  Filled 2011-12-24: qty 1

## 2011-12-24 NOTE — Progress Notes (Signed)
Subjective: Breathing continues to improve, Had a lot of gas pain this morning Had a BM which was normal  Objective: Vital signs in last 24 hours: Temp:  [97.8 F (36.6 C)-97.9 F (36.6 C)] 97.9 F (36.6 C) (01/06 0600) Pulse Rate:  [58-76] 76  (01/06 1026) Resp:  [14] 14  (01/06 0600) BP: (107-115)/(58-78) 107/58 mmHg (01/06 0600) SpO2:  [92 %-95 %] 92 % (01/06 0600) Weight change:  Last BM Date: 12/23/11  Intake/Output from previous day:   Total I/O In: 120 [P.O.:120] Out: -  Physical Exam: General: Alert, awake, oriented x3, in no acute distress. HEENT: No bruits, no goiter. Heart: Regular rate and rhythm, without murmurs, rubs, gallops. Lungs: Clear to auscultation bilaterally, decreased breath sounds at bases Abdomen: Soft, nontender, nondistended, positive bowel sounds. Extremities: No clubbing cyanosis or edema with positive pedal pulses. Neuro: Grossly intact, nonfocal.  Lab Results: Basic Metabolic Panel: No results found for this basename: NA:2,K:2,CL:2,CO2:2,GLUCOSE:2,BUN:2,CREATININE:2,CALCIUM:2,MG:2,PHOS:2 in the last 72 hours Liver Function Tests: No results found for this basename: AST:2,ALT:2,ALKPHOS:2,BILITOT:2,PROT:2,ALBUMIN:2 in the last 72 hours No results found for this basename: LIPASE:2,AMYLASE:2 in the last 72 hours No results found for this basename: AMMONIA:2 in the last 72 hours CBC:  Basename 12/24/11 0500 12/23/11 0545  WBC 3.4* 3.6*  NEUTROABS -- --  HGB 10.0* 10.0*  HCT 32.0* 31.1*  MCV 91.7 91.5  PLT 256 258   Cardiac Enzymes: No results found for this basename: CKTOTAL:3,CKMB:3,CKMBINDEX:3,TROPONINI:3 in the last 72 hours BNP: No results found for this basename: PROBNP:3 in the last 72 hours D-Dimer: No results found for this basename: DDIMER:2 in the last 72 hours CBG: No results found for this basename: GLUCAP:6 in the last 72 hours Hemoglobin A1C: No results found for this basename: HGBA1C in the last 72 hours Fasting  Lipid Panel: No results found for this basename: CHOL,HDL,LDLCALC,TRIG,CHOLHDL,LDLDIRECT in the last 72 hours Thyroid Function Tests: No results found for this basename: TSH,T4TOTAL,FREET4,T3FREE,THYROIDAB in the last 72 hours Anemia Panel: No results found for this basename: VITAMINB12,FOLATE,FERRITIN,TIBC,IRON,RETICCTPCT in the last 72 hours Coagulation:  Basename 12/24/11 0500 12/23/11 0545  LABPROT 28.0* 22.7*  INR 2.57* 1.96*   Urine Drug Screen: Drugs of Abuse  No results found for this basename: labopia,  cocainscrnur,  labbenz,  amphetmu,  thcu,  labbarb    Alcohol Level: No results found for this basename: ETH:2 in the last 72 hours  No results found for this or any previous visit (from the past 240 hour(s)).  Studies/Results: No results found.  Medications: Scheduled Meds:    . calcium-vitamin D  2 tablet Oral Daily  . cycloSPORINE  1 drop Both Eyes BID  . digoxin  125 mcg Oral Daily  . lipase/protease/amylase  1 capsule Oral TID AC  . multivitamin  1 tablet Oral Daily  . nebivolol  2.5 mg Oral Daily  . pantoprazole  40 mg Oral BID AC  . senna-docusate  2 tablet Oral QHS  . simethicone  80 mg Oral Once  . warfarin  2 mg Oral ONCE-1800  . warfarin  2.5 mg Oral ONCE-1800   Continuous Infusions:    . sodium chloride 10 mL/hr at 12/20/11 1005  . DISCONTD: heparin 1,650 Units/hr (12/24/11 0341)   PRN Meds:.acetaminophen, acetaminophen, alum & mag hydroxide-simeth, HYDROcodone-acetaminophen, HYDROmorphone, ondansetron (ZOFRAN) IV, ondansetron, oxyCODONE, polyethylene glycol, polyvinyl alcohol, simethicone, zolpidem  Assessment/Plan: 1. Pulmonary embolism, bilateral: DC IV heparin today , completed bridging continue Coumadin per pharmacy Check INR in a.m.  Continue to monitor closely for  recurrence of GI bleeds, hemoccult negative, cbc in am  continue PPI Risk factor for PE: recent hospitalization, and recent discontinuation of coumadin 2. ANEMIA-IRON  DEFICIENCY: Hb stable, continue to monitor 3. Atrial Fibrillation: Rate controlled continue digoxin, coumadin being restarted for #1 4. Recent Colonic bleed s/p endo clipping of possible Dieulafoy lesion at hepatic flexure within last 10-15days, will notify GI of this admission especially since she was due to followup with Dr. Leone Payor 5. Chronic rib fracture w/ rib pain: use vicodin PRN, incentive spirometry 6. HTN: BP running on softer side, will DC HCTZ/Losartan 7. Belching/ Gas pain: Try simethicone 8. Disposition: SNF Monday    LOS: 6 days   Januel Doolan [facility] Pager: [phone] 12/24/2011, 2:25 PM

## 2011-12-24 NOTE — Progress Notes (Signed)
ANTICOAGULATION CONSULT NOTE - Follow Up Consult  Pharmacy Consult for heparin + coumadin Indication: pulmonary embolus (also hx of afib, off coumadin d/t GIB earlier this month)  Vital Signs: Temp: 97.9 F (36.6 C) (01/06 0600) BP: 107/58 mmHg (01/06 0600) Pulse Rate: 76  (01/06 1026)  Labs:  Basename 12/24/11 0500 12/23/11 0545 12/22/11 0831 12/22/11 0514  HGB 10.0* 10.0* -- --  HCT 32.0* 31.1* 33.2* --  PLT 256 258 272 --  APTT -- -- -- --  LABPROT 28.0* 22.7* -- 17.2*  INR 2.57* 1.96* -- 1.38  HEPARINUNFRC 0.57 0.27* -- 0.29*  CREATININE -- -- -- --  CKTOTAL -- -- -- --  CKMB -- -- -- --  TROPONINI -- -- -- --   Estimated Creatinine Clearance: 39.3 ml/min (by C-G formula based on Cr of 1.08).  Assessment: 25 yof with history of afib and new PE on IV heparin and coumadin. INR with increase of 1.96 to 2.57 and heparin at  goal (0.5). Patient has been at goal INR on 2-3 mg/day in December of 2012.  Goal of Therapy:  INR 2-3 Heparin level 0.3-0.7 units/ml   Plan:  - Coumadin 2mg  today (anticipate INR increase and hesitant to increase more due to recent GIB) -No heparin changes, anticipate d/c in am  Benny Lennert 12/24/2011,10:33 AM

## 2011-12-25 MED ORDER — HYDROCODONE-ACETAMINOPHEN 5-500 MG PO TABS: ORAL_TABLET | ORAL | Status: DC

## 2011-12-25 MED ORDER — ZOLPIDEM TARTRATE 5 MG PO TABS: 5.0000 mg | ORAL_TABLET | Freq: Every evening | ORAL | Status: DC | PRN

## 2011-12-25 MED ORDER — PANTOPRAZOLE SODIUM 40 MG PO TBEC: 40.0000 mg | DELAYED_RELEASE_TABLET | Freq: Two times a day (BID) | ORAL | Status: DC

## 2011-12-25 MED ORDER — WARFARIN SODIUM 2 MG PO TABS: 2.0000 mg | ORAL_TABLET | Freq: Every day | ORAL | Status: DC

## 2011-12-25 NOTE — Progress Notes (Signed)
Clinical Child psychotherapist (CSW) informed that pt ready for d/c. CSW confirmed with facility that pt okay to arrive today. CSW faxed pt dc summary, avs and d/c report to facility, and prepared pt dc packet. Pt going to transport to facility via daughter in laws car. CSW provided pt with dc packet to give to facility. CSW also contacted [facility] [employer] to inform of dc who stated would contact [employer] with authorization number. CSW is signing off.  Theresia Bough, MSW, Theresia Majors [phone]

## 2011-12-25 NOTE — Progress Notes (Signed)
D/c orders received; iv removed with gauze on; pt remains in stable condition;pt d/c to SNF, daughter-in-law to transport to facility; pt meds and information reviewed and given to pt

## 2011-12-25 NOTE — Discharge Summary (Addendum)
Physician Discharge Summary  Patient ID: [name] MRN: [id number] DOB/AGE: 1922-06-20 78 y.o.  Admit date: 12/18/2011 Discharge date: 12/25/2011  Primary Care Physician:  [name], MD Gastroenterologist: Dr. Stan Head / [facility] Cardiologist: Dr. Nicki Guadalajara / [facility]  Discharge Diagnoses:   1. Pulmonary embolism, bilateral 2. Recent Colonic bleed (? Dieulafoy lesion at hepatic flexure) s/p endoclipping on 12/18 3. ANEMIA-IRON DEFICIENCY 4. ATRIAL FIBRILLATION, CHRONIC 5. PANCREATIC INSUFFICIENCY 6. Irritable bowel syndrome 7. Osteoarthritis 8. Peptic ulcer disease 9. Vitamin B12 deficiency 10. Chronic vertebral compression fractures   Current Discharge Medication List    START taking these medications   Details  pantoprazole (PROTONIX) 40 MG tablet Take 1 tablet (40 mg total) by mouth 2 (two) times daily before a meal. Qty: 30 tablet, Refills: 0    warfarin (COUMADIN) 2 MG tablet Take 1 tablet (2 mg total) by mouth daily. For INR 2-3, please check INR in 2 days(1/9) and re-adjust coumadin dose accordingly Qty: 30 tablet, Refills: 0    zolpidem (AMBIEN) 5 MG tablet Take 1 tablet (5 mg total) by mouth at bedtime as needed for sleep (insomnia). Qty: 30 tablet, Refills: 0      CONTINUE these medications which have CHANGED   Details  HYDROcodone-acetaminophen (VICODIN) 5-500 MG per tablet 1 po q 4-6 hours prn abdominal pain Qty: 25 tablet, Refills: 0      CONTINUE these medications which have NOT CHANGED   Details  Calcium 1200-1000 MG-UNIT CHEW Take 1 by mouth once daily     cycloSPORINE (RESTASIS) 0.05 % ophthalmic emulsion Place 1 drop into both eyes 2 (two) times daily.      digoxin (LANOXIN) 0.125 MG tablet Take 125 mcg by mouth daily.      furosemide (LASIX) 20 MG tablet Take 20 mg by mouth daily as needed. FLUID RETENTION    lansoprazole (PREVACID) 30 MG capsule TAKE 1 TABLET 30 MINUTES BEFORE BREAKFAST Qty: 30 capsule, Refills: 11    Lutein  20 MG TABS Take 1 tablet by mouth daily.      methylcellulose (ARTIFICIAL TEARS) 1 % ophthalmic solution Place 1 drop into both eyes as needed. DRY EYES     nebivolol (BYSTOLIC) 5 MG tablet Take 2.5 mg by mouth daily.      Pancrelipase, Lip-Prot-Amyl, (CREON) 24000 UNITS CPEP Take by mouth. Take 1 tablet by mouth three times a day ac meals and snacks       STOP taking these medications     valsartan-hydrochlorothiazide (DIOVAN-HCT) 80-12.5 MG per tablet      ciprofloxacin (CIPRO) 500 MG tablet      rifaximin (XIFAXAN) 550 MG TABS          Disposition and Follow-up:  1. Dr.Gessner 2 weeks 2. Dr.Thomas Kelly in 1-2 months 3. PCP in 7-10days   Consults:  D/w GI   Significant Diagnostic Studies:  1. Dg Chest 2 View 12/18/2011  *RADIOLOGY REPORT*  .  IMPRESSION: No evidence of acute cardiopulmonary disease.  Mild cardiomegaly.  Original Report Authenticated By: Charline Bills, M.D.   2. Ct Angio Chest W/cm &/or Wo Cm 12/18/2011  *RADIOLOGY REPORT*    IMPRESSION: 1.  Positive for bilateral lower lobe acute pulmonary emboli. 2.  Underlying cardiomegaly and pulmonary artery hypertension. 3.  Trace pleural effusions.  Mildly increased atelectasis. 4.  Chronic thoracic scoliosis and compression fractures.  Critical Value/emergent results were called by telephone at the time of interpretation on 12/18/2011  at 1603 hours  to  Dr.  Lorre Nick, who verbally acknowledged these results.  Original Report Authenticated By: Harley Hallmark, M.D.    Brief H and P: Ms. Vinciguerra is an 78 year old female was discharged in the hospital on 12/20 by GI after an admission for colonic bleed where she had Endo Clipping been done for a Dieulafoy lesion at hepatic flexure and was taken off Coumadin which he had been previously on for her chronic A. Fib. She presented to the hospital with increasing shortness of breath, respiratory distress and increased oxygen requirements her CT of her chest on admission  was positive for bilateral acute pulmonary emboli.   Hospital Course:  1. Acute pulmonary embolism: For this she was started on IV heparin and Coumadin had to be resumed, she clinically improved from a pulmonary standpoint, was weaned off oxygen. Her heparin was stopped yesterday and she's remained therapeutic on Coumadin for the last 2 days, we also watched her closely for any recurrence of GI bleed which she fortunately did not have. She will continue Coumadin at least for 3-6 more months to treat her PE and then decision to continue that after 6 months will be deferred to her cardiologist and gastroenterologist given her chronic A. Fib 2. Recent colon bleed with Endo clipping and acute blood loss anemia from prior admission. Did not have recurrence of active bleeding while on anticoagulation and her hemoglobin remained stable. If bleeding recurs in the near future she will need an IVC filter.  3. A. fib rate has been controlled continued on her bystolic and digoxin  4. HTN: her blood pressure has been running on the lower side hence her Diovan-HCTZ has been discontinued  Time spent on Discharge:  Signed: Madisen Ludvigsen [facility]  12/25/2011, 10:06 AM

## 2011-12-26 ENCOUNTER — Ambulatory Visit: Payer: Medicare Other | Admitting: Internal Medicine

## 2012-01-16 ENCOUNTER — Encounter: Payer: Self-pay | Admitting: Internal Medicine

## 2012-01-16 ENCOUNTER — Ambulatory Visit (INDEPENDENT_AMBULATORY_CARE_PROVIDER_SITE_OTHER): Payer: Medicare Other | Admitting: Internal Medicine

## 2012-01-16 VITALS — BP 118/64 | HR 72 | Ht 66.0 in | Wt 181.0 lb

## 2012-01-16 DIAGNOSIS — D62 Acute posthemorrhagic anemia: Secondary | ICD-10-CM

## 2012-01-16 DIAGNOSIS — K922 Gastrointestinal hemorrhage, unspecified: Secondary | ICD-10-CM

## 2012-01-16 DIAGNOSIS — E538 Deficiency of other specified B group vitamins: Secondary | ICD-10-CM

## 2012-01-16 MED ORDER — FERROUS SULFATE 325 (65 FE) MG PO TABS: 325.0000 mg | ORAL_TABLET | Freq: Every day | ORAL | Status: DC

## 2012-01-16 MED ORDER — FERROUS SULFATE 325 (65 FE) MG PO TABS: 325.0000 mg | ORAL_TABLET | Freq: Two times a day (BID) | ORAL | Status: DC

## 2012-01-16 NOTE — Progress Notes (Signed)
Subjective:    Patient ID: [name], female    DOB: [birth year], 78 y.o.   MRN: [id number]  HPI This delightful elderly woman is here with her daughter today for followup after GI bleed. She ended up having an arterial bleeding site in the right colon, after the second admission within a year, and multiple procedures and studies this was found and then successfully treated by Dr. Christella Hartigan. Her warfarin was held, and in that period, soon after her discharge, she developed bilateral pulmonary emboli. She is now back on warfarin. She is not having a bleeding. She's been in in acute care facility and has been going for physical therapy and gradually regaining her strength. She's using a walker. She is not on iron supplementation. She just had a CBC drawn within the past few days as best I can tell from the orders I don't have those results. She is hoping to go to [place] later this year, in June. It's the one states she has never visited. Allergies  Allergen Reactions  . Meperidine Hcl     REACTION: nausea: VOMIT  . Morphine Sulfate     REACTION: nausea: "VOMIT"   Outpatient Prescriptions Prior to Visit  Medication Sig Dispense Refill  . Calcium 1200-1000 MG-UNIT CHEW Take 1 by mouth once daily       . cycloSPORINE (RESTASIS) 0.05 % ophthalmic emulsion Place 1 drop into both eyes 2 (two) times daily.        . digoxin (LANOXIN) 0.125 MG tablet Take 125 mcg by mouth daily.        . furosemide (LASIX) 20 MG tablet Take 20 mg by mouth daily as needed. FLUID RETENTION      . HYDROcodone-acetaminophen (VICODIN) 5-500 MG per tablet 1 po q 4-6 hours prn abdominal pain  25 tablet  0  . lansoprazole (PREVACID) 30 MG capsule TAKE 1 TABLET 30 MINUTES BEFORE BREAKFAST  30 capsule  11  . Lutein 20 MG TABS Take 1 tablet by mouth daily.        . methylcellulose (ARTIFICIAL TEARS) 1 % ophthalmic solution Place 1 drop into both eyes as needed. DRY EYES       . nebivolol (BYSTOLIC) 5 MG tablet Take 2.5 mg by  mouth daily.        . Pancrelipase, Lip-Prot-Amyl, (CREON) 24000 UNITS CPEP Take by mouth. Take 1 tablet by mouth three times a day ac meals and snacks       . warfarin (COUMADIN) 2 MG tablet Take 1 tablet (2 mg total) by mouth daily. For INR 2-3, please check INR in 2 days(1/9) and re-adjust coumadin dose accordingly  30 tablet  0  . pantoprazole (PROTONIX) 40 MG tablet Take 1 tablet (40 mg total) by mouth 2 (two) times daily before a meal.  30 tablet  0  . zolpidem (AMBIEN) 5 MG tablet Take 1 tablet (5 mg total) by mouth at bedtime as needed for sleep (insomnia).  30 tablet  0   Past Medical History  Diagnosis Date  . Abnormal liver function test   . Osteoarthritis     back and lower extremities, neck  . Atrial fibrillation, chronic   . IBS (irritable bowel syndrome)   . Pancreatic insufficiency     ? mass on imaging notborne out on follow-up  . Gastric ulcer   . Erosive gastritis 7/10  . Vitamin B12 deficiency   . Iron deficiency anemia   . Compression fracture of C-spine   .  Pulmonary embolism     bilateral   . Diverticulosis   . Internal hemorrhoids    Past Surgical History  Procedure Date  . Tubal ligation   . Back surgery   . Left knee surgery   . Hernia repair   . Carpal tunnel release     right  . Wrist surgery     right plate  . Bladder surgery     tact  . Ovarian cyst removal   . Esophagogastroduodenoscopy 06/2009    erosive gastritis, small ulcer, hiatal hernia (negative biopsies)  . Colonoscopy 08/2009    diverticulosis, internal hemorrhoids  . Upper gastrointestinal endoscopy 05/30/11    normal  . Colonoscopy 06/02/11    Diverticulosis  . Capsule endoscopy 06/04/11    normal  . Esophagogastroduodenoscopy 11/29/2011    Procedure: ESOPHAGOGASTRODUODENOSCOPY (EGD);  Surgeon: Louis Meckel, MD;  Location: [facility];  Service: Endoscopy;  Laterality: N/A;  . Givens capsule study 11/29/2011    Procedure: GIVENS CAPSULE STUDY;  Surgeon: Louis Meckel, MD;   Location: [facility];  Service: Endoscopy;  Laterality: N/A;  . Enteroscopy 12/02/2011    Procedure: ENTEROSCOPY;  Surgeon: Louis Meckel, MD;  Location: [facility];  Service: Endoscopy;  Laterality: N/A;  . Colonoscopy 12/03/2011    Procedure: COLONOSCOPY;  Surgeon: Louis Meckel, MD;  Location: [facility];  Service: Endoscopy;  Laterality: N/A;  . Colonoscopy 12/04/2011    Procedure: COLONOSCOPY;  Surgeon: Rob Bunting, MD;  Location: [facility];  Service: Endoscopy;  Laterality: N/A;   History   Social History  . Marital Status: Widowed    Spouse Name: N/A    Number of Children: 3  . Years of Education: N/A   Occupational History  . Retired    Social History Main Topics  . Smoking status: Never Smoker   . Smokeless tobacco: Never Used  . Alcohol Use: Yes     glass of wine occasionaly  . Drug Use: No  . Sexually Active: None   Other Topics Concern  . None   Social History Narrative   still active and drives, works at sons Human resources officer   Family History  Problem Relation Age of Onset  . Heart disease Mother   . Anesthesia problems Neg Hx   . Malignant hyperthermia Neg Hx   . Colon cancer Neg Hx         Review of Systems Weak overall, gradually improving and regaining exercise tolerance    Objective:   Physical Exam General:  NAD Eyes:   anicteric Lungs:  clear Heart:  S1S2 no rubs, murmurs or gallops Abdomen:  soft and nontender, BS+     Data Reviewed: Have requested most recent CBC         Assessment & Plan:

## 2012-01-16 NOTE — Assessment & Plan Note (Signed)
Had a recent injection when she was hospitalized. Repeat in about 2 months when she returns for followup. Also consider high-dose oral supplementation.

## 2012-01-16 NOTE — Assessment & Plan Note (Signed)
This appears to have been the cause of her recurrent bleeding. We'll continue to monitor. She needs to be back on warfarin because of pulmonary emboli plus she also has a history of atrophic fibrillation. At this point I would favor continuing her warfarin if cardiology thinks it makes sense, to reduce the risk of stroke. Would have to fracture in fall risk etc. as time goes forward and she gets older.  She is on a PPI chronically at this point. At one time she did have a small gastric ulcer. Can revisit whether she needs to take that chronically when she returns for followup in 2 months.

## 2012-01-16 NOTE — Patient Instructions (Addendum)
Please start Ferrous Sulfate 325 mg twice a day you can purchase this over-the counter. Return to see Dr. Leone Payor in 2 months. [facility] is to send/fax most recent labs to our office at fax # [phone].

## 2012-01-16 NOTE — Assessment & Plan Note (Addendum)
Await CBC results from [facility]. Start ferrous sulfate 325 mg twice a day.  Hgb 10.9 on 01/10/12

## 2012-01-17 ENCOUNTER — Telehealth: Payer: Self-pay | Admitting: Gastroenterology

## 2012-01-17 DIAGNOSIS — D649 Anemia, unspecified: Secondary | ICD-10-CM

## 2012-01-17 NOTE — Telephone Encounter (Signed)
Message copied by Bernita Buffy on Wed Jan 17, 2012  4:32 PM ------      Message from: Stan Head E      Created: Wed Jan 17, 2012 12:34 PM       Let her know Hgb was 10.9 - not normal but not real low      Take the iron and we will follow-up CBC      Have her get a CBC in 6 weeks - use anemia dx on problem list

## 2012-01-17 NOTE — Telephone Encounter (Signed)
Patient informed of results of labs and told per Dr. Leone Payor to come in in 6 weeks to repeat CBC. Order placed.

## 2012-01-30 ENCOUNTER — Ambulatory Visit
Admission: RE | Admit: 2012-01-30 | Discharge: 2012-01-30 | Disposition: A | Discharge status: Home / Self Care | Payer: Medicare Other | Source: Ambulatory Visit | Attending: Cardiovascular Disease | Admitting: Cardiovascular Disease

## 2012-01-30 ENCOUNTER — Other Ambulatory Visit: Payer: Self-pay | Admitting: Cardiovascular Disease

## 2012-01-30 DIAGNOSIS — R0602 Shortness of breath: Secondary | ICD-10-CM

## 2012-02-13 DIAGNOSIS — R931 Abnormal findings on diagnostic imaging of heart and coronary circulation: Secondary | ICD-10-CM

## 2012-02-13 HISTORY — DX: Abnormal findings on diagnostic imaging of heart and coronary circulation: R93.1

## 2012-02-26 ENCOUNTER — Telehealth: Payer: Self-pay | Admitting: Gastroenterology

## 2012-02-26 NOTE — Telephone Encounter (Signed)
Message copied by Bernita Buffy on Mon Feb 26, 2012 11:26 AM ------      Message from: Tedra Senegal A      Created: Wed Jan 17, 2012  4:31 PM       Call the patient to remind her to come in for her lab appointment: CBC

## 2012-02-26 NOTE — Telephone Encounter (Signed)
called the patient and reminded her to come in to have her lab drawn: CBC.

## 2012-02-28 ENCOUNTER — Other Ambulatory Visit (INDEPENDENT_AMBULATORY_CARE_PROVIDER_SITE_OTHER): Payer: Medicare Other

## 2012-02-28 DIAGNOSIS — D649 Anemia, unspecified: Secondary | ICD-10-CM

## 2012-02-28 LAB — CBC WITH DIFFERENTIAL/PLATELET
Basophils Absolute: 0 10*3/uL (ref 0.0–0.1)
Basophils Relative: 0.7 % (ref 0.0–3.0)
Eosinophils Absolute: 0.1 10*3/uL (ref 0.0–0.7)
HCT: 42.2 % (ref 36.0–46.0)
Hemoglobin: 13.6 g/dL (ref 12.0–15.0)
Lymphocytes Relative: 24 % (ref 12.0–46.0)
Lymphs Abs: 0.9 10*3/uL (ref 0.7–4.0)
MCHC: 32.3 g/dL (ref 30.0–36.0)
MCV: 87.6 fl (ref 78.0–100.0)
Neutro Abs: 2.2 10*3/uL (ref 1.4–7.7)
RBC: 4.82 Mil/uL (ref 3.87–5.11)
RDW: 21.5 % — ABNORMAL HIGH (ref 11.5–14.6)

## 2012-02-28 NOTE — Progress Notes (Signed)
Quick Note:  Her Hgb is now normal  I recommend she take 1 iron pill a day for 2 more months and then could stop it  Follow-up as needed  Please cc this result and note to her PCP ______

## 2012-03-12 ENCOUNTER — Ambulatory Visit (INDEPENDENT_AMBULATORY_CARE_PROVIDER_SITE_OTHER): Payer: Medicare Other | Admitting: Internal Medicine

## 2012-03-12 ENCOUNTER — Encounter: Payer: Self-pay | Admitting: Internal Medicine

## 2012-03-12 VITALS — BP 116/60 | HR 68 | Ht 66.0 in | Wt 186.0 lb

## 2012-03-12 DIAGNOSIS — D500 Iron deficiency anemia secondary to blood loss (chronic): Secondary | ICD-10-CM

## 2012-03-12 DIAGNOSIS — K8689 Other specified diseases of pancreas: Secondary | ICD-10-CM

## 2012-03-12 DIAGNOSIS — E538 Deficiency of other specified B group vitamins: Secondary | ICD-10-CM

## 2012-03-12 DIAGNOSIS — K589 Irritable bowel syndrome without diarrhea: Secondary | ICD-10-CM

## 2012-03-12 MED ORDER — CYANOCOBALAMIN 1000 MCG PO TABS: 1000.0000 ug | ORAL_TABLET | Freq: Every day | ORAL | Status: DC

## 2012-03-12 NOTE — Patient Instructions (Signed)
We are currently out of Vitamin B-12 injections so Dr. Leone Payor would like for you to take Vitamin B-12 . One tablet every day.  We will contact you regarding the Vitamin B-12 injection, if you have not heard from [place] by early May call us back. You may stop your iron supplement in early May 2013 per Dr. Leone Payor. Follow-up with [place] in 1 year or sooner if needed.

## 2012-03-12 NOTE — Progress Notes (Signed)
Patient ID: [name], female   DOB: 1922-09-18, 78 y.o.   MRN: [id number]  HPI  This very pleasant elderly patient returns for followup of a blood loss anemia, she had a GI bleed that was difficult to find. 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 She is back on warfarin and notes been somewhat difficult getting her INR regulated she is not having any more bleeding. A CBC 10 days ago approximately showed a normal hemoglobin.  She is having some dyspnea on exertion, she says Dr. Tresa Endo check an echocardiogram and a chest x-ray and said things are stable there. She is having right knee pain. She wonders if she could go back on Celebrex. She does report that when she stop the Celebrex after we found a gastric ulcer last year, she did okay, she is just having problems again with knee and other arthritis pains in the last month or so. Tylenol does not work for the pain.  She has decided not to go to [place], because she is moving from her 7 room house into a 3 room condominium. She may take a bus trip to [place] later in the summer.  Medications, allergies, past medical history, past surgical history, family history and social history are reviewed and updated in the EMR.   Assessment:  1. Blood loss anemia   Resolved.   2. B12 deficiency    3. Pancreatic insufficiency   No Problems on enzyme replacement   4. IBS (irritable bowel syndrome)   Doing well    Plan:  #1 she will continue her iron until early May. She may stop it then.   #2 we do not have injectable vitamin B12, she will start 1000 mcg oral daily and we plan to administer injectable B12 when available. She is to check with primary care as well.  #3 she could conceivably retry Celebrex as long as she stays on a PPI. Have decided to leave her on a PPI at this point. If she continues to have arthritic complaints that significantly limited  her quality-of-life going forward, I think it's okay for her to retry Celebrex. She will discuss with primary care. Other option might be topical NSAID  #4 she asked about checking a vitamin D level, would defer to primary care  CC; Dr. Rosanne Gutting and Dr. Daphene Jaeger

## 2012-03-29 ENCOUNTER — Ambulatory Visit (INDEPENDENT_AMBULATORY_CARE_PROVIDER_SITE_OTHER): Payer: Medicare Other | Admitting: Internal Medicine

## 2012-03-29 DIAGNOSIS — D649 Anemia, unspecified: Secondary | ICD-10-CM

## 2012-03-29 MED ORDER — CYANOCOBALAMIN 1000 MCG/ML IJ SOLN
1000.0000 ug | INTRAMUSCULAR | Status: DC
  Administered 2012-03-29 – 2012-06-17 (×2): 1000 ug via INTRAMUSCULAR

## 2012-04-16 ENCOUNTER — Other Ambulatory Visit: Payer: Self-pay | Admitting: Family Medicine

## 2012-04-16 DIAGNOSIS — Z1231 Encounter for screening mammogram for malignant neoplasm of breast: Secondary | ICD-10-CM

## 2012-05-09 ENCOUNTER — Ambulatory Visit: Payer: Medicare Other

## 2012-05-09 ENCOUNTER — Other Ambulatory Visit: Payer: Medicare Other

## 2012-05-10 ENCOUNTER — Ambulatory Visit
Admission: RE | Admit: 2012-05-10 | Discharge: 2012-05-10 | Disposition: A | Discharge status: Home / Self Care | Payer: Medicare Other | Source: Ambulatory Visit | Attending: Family Medicine | Admitting: Family Medicine

## 2012-05-10 DIAGNOSIS — Z1231 Encounter for screening mammogram for malignant neoplasm of breast: Secondary | ICD-10-CM

## 2012-05-16 ENCOUNTER — Other Ambulatory Visit: Payer: Self-pay | Admitting: Family Medicine

## 2012-05-16 DIAGNOSIS — R928 Other abnormal and inconclusive findings on diagnostic imaging of breast: Secondary | ICD-10-CM

## 2012-05-23 ENCOUNTER — Ambulatory Visit
Admission: RE | Admit: 2012-05-23 | Discharge: 2012-05-23 | Disposition: A | Discharge status: Home / Self Care | Payer: Medicare Other | Source: Ambulatory Visit | Attending: Family Medicine | Admitting: Family Medicine

## 2012-05-23 DIAGNOSIS — R928 Other abnormal and inconclusive findings on diagnostic imaging of breast: Secondary | ICD-10-CM

## 2012-06-17 ENCOUNTER — Ambulatory Visit (INDEPENDENT_AMBULATORY_CARE_PROVIDER_SITE_OTHER): Payer: Medicare Other | Admitting: Internal Medicine

## 2012-06-17 DIAGNOSIS — D649 Anemia, unspecified: Secondary | ICD-10-CM

## 2012-06-17 DIAGNOSIS — E538 Deficiency of other specified B group vitamins: Secondary | ICD-10-CM

## 2012-09-17 ENCOUNTER — Ambulatory Visit (INDEPENDENT_AMBULATORY_CARE_PROVIDER_SITE_OTHER): Payer: Medicare Other | Admitting: Gastroenterology

## 2012-09-17 DIAGNOSIS — E538 Deficiency of other specified B group vitamins: Secondary | ICD-10-CM

## 2012-09-17 MED ORDER — CYANOCOBALAMIN 1000 MCG/ML IJ SOLN
1000.0000 ug | Freq: Once | INTRAMUSCULAR | Status: AC
  Administered 2012-09-17: 1000 ug via INTRAMUSCULAR

## 2012-11-04 ENCOUNTER — Telehealth: Payer: Self-pay | Admitting: Internal Medicine

## 2012-11-04 ENCOUNTER — Observation Stay (HOSPITAL_COMMUNITY)
Admission: EM | Admit: 2012-11-04 | Discharge: 2012-11-07 | Disposition: A | Discharge status: Home / Self Care | Payer: Medicare Other | Attending: Internal Medicine | Admitting: Internal Medicine

## 2012-11-04 ENCOUNTER — Encounter (HOSPITAL_COMMUNITY): Payer: Self-pay | Admitting: Emergency Medicine

## 2012-11-04 DIAGNOSIS — R0609 Other forms of dyspnea: Secondary | ICD-10-CM | POA: Insufficient documentation

## 2012-11-04 DIAGNOSIS — R42 Dizziness and giddiness: Secondary | ICD-10-CM | POA: Insufficient documentation

## 2012-11-04 DIAGNOSIS — K8689 Other specified diseases of pancreas: Secondary | ICD-10-CM | POA: Diagnosis present

## 2012-11-04 DIAGNOSIS — Z7901 Long term (current) use of anticoagulants: Secondary | ICD-10-CM

## 2012-11-04 DIAGNOSIS — I4891 Unspecified atrial fibrillation: Secondary | ICD-10-CM

## 2012-11-04 DIAGNOSIS — E538 Deficiency of other specified B group vitamins: Secondary | ICD-10-CM | POA: Diagnosis present

## 2012-11-04 DIAGNOSIS — K589 Irritable bowel syndrome without diarrhea: Secondary | ICD-10-CM

## 2012-11-04 DIAGNOSIS — K5730 Diverticulosis of large intestine without perforation or abscess without bleeding: Secondary | ICD-10-CM | POA: Insufficient documentation

## 2012-11-04 DIAGNOSIS — D126 Benign neoplasm of colon, unspecified: Secondary | ICD-10-CM | POA: Insufficient documentation

## 2012-11-04 DIAGNOSIS — K922 Gastrointestinal hemorrhage, unspecified: Principal | ICD-10-CM

## 2012-11-04 DIAGNOSIS — Z86718 Personal history of other venous thrombosis and embolism: Secondary | ICD-10-CM | POA: Insufficient documentation

## 2012-11-04 DIAGNOSIS — R0989 Other specified symptoms and signs involving the circulatory and respiratory systems: Secondary | ICD-10-CM | POA: Insufficient documentation

## 2012-11-04 DIAGNOSIS — K6389 Other specified diseases of intestine: Secondary | ICD-10-CM | POA: Insufficient documentation

## 2012-11-04 HISTORY — DX: Fibromyalgia: M79.7

## 2012-11-04 LAB — COMPREHENSIVE METABOLIC PANEL
Alkaline Phosphatase: 64 U/L (ref 39–117)
BUN: 22 mg/dL (ref 6–23)
CO2: 25 mEq/L (ref 19–32)
Calcium: 9.1 mg/dL (ref 8.4–10.5)
GFR calc Af Amer: 68 mL/min — ABNORMAL LOW (ref >90)
GFR calc non Af Amer: 59 mL/min — ABNORMAL LOW (ref >90)
Glucose, Bld: 98 mg/dL (ref 70–99)
Potassium: 3.7 mEq/L (ref 3.5–5.1)
Total Protein: 6 g/dL (ref 6.0–8.3)

## 2012-11-04 LAB — DIFFERENTIAL
Eosinophils Absolute: 0.1 10*3/uL (ref 0.0–0.7)
Eosinophils Relative: 3 % (ref 0–5)
Lymphs Abs: 1.2 10*3/uL (ref 0.7–4.0)
Monocytes Relative: 8 % (ref 3–12)
Neutrophils Relative %: 64 % (ref 43–77)

## 2012-11-04 LAB — CBC WITH DIFFERENTIAL/PLATELET

## 2012-11-04 LAB — CBC
HCT: 38.5 % (ref 36.0–46.0)
Hemoglobin: 13 g/dL (ref 12.0–15.0)
MCH: 31.9 pg (ref 26.0–34.0)
MCHC: 33.8 g/dL (ref 30.0–36.0)
RBC: 4.07 MIL/uL (ref 3.87–5.11)

## 2012-11-04 LAB — TYPE AND SCREEN: ABO/RH(D): B POS

## 2012-11-04 LAB — OCCULT BLOOD, POC DEVICE: Fecal Occult Bld: POSITIVE

## 2012-11-04 LAB — PHOSPHORUS: Phosphorus: 3.2 mg/dL (ref 2.3–4.6)

## 2012-11-04 MED ORDER — CYCLOSPORINE 0.05 % OP EMUL
1.0000 [drp] | Freq: Two times a day (BID) | OPHTHALMIC | Status: DC
  Administered 2012-11-04 – 2012-11-07 (×6): 1 [drp] via OPHTHALMIC
  Filled 2012-11-04 (×10): qty 1

## 2012-11-04 MED ORDER — DIGOXIN 125 MCG PO TABS
125.0000 ug | ORAL_TABLET | Freq: Every day | ORAL | Status: DC
  Administered 2012-11-05 – 2012-11-07 (×3): 125 ug via ORAL
  Filled 2012-11-04 (×4): qty 1

## 2012-11-04 MED ORDER — SODIUM CHLORIDE 0.9 % IJ SOLN
3.0000 mL | Freq: Two times a day (BID) | INTRAMUSCULAR | Status: DC
  Administered 2012-11-04 – 2012-11-06 (×4): 3 mL via INTRAVENOUS

## 2012-11-04 MED ORDER — LUTEIN 20 MG PO TABS: 1.0000 | ORAL_TABLET | Freq: Every day | ORAL | Status: DC

## 2012-11-04 MED ORDER — FUROSEMIDE 20 MG PO TABS
20.0000 mg | ORAL_TABLET | Freq: Every day | ORAL | Status: DC
  Administered 2012-11-04 – 2012-11-07 (×4): 20 mg via ORAL
  Filled 2012-11-04 (×4): qty 1

## 2012-11-04 MED ORDER — PANCRELIPASE (LIP-PROT-AMYL) 12000-38000 UNITS PO CPEP
2.0000 | ORAL_CAPSULE | Freq: Three times a day (TID) | ORAL | Status: DC
  Administered 2012-11-05 – 2012-11-07 (×6): 2 via ORAL
  Filled 2012-11-04 (×12): qty 2

## 2012-11-04 MED ORDER — ONDANSETRON HCL 4 MG/2ML IJ SOLN: 4.0000 mg | Freq: Four times a day (QID) | INTRAMUSCULAR | Status: DC | PRN

## 2012-11-04 MED ORDER — ONDANSETRON HCL 4 MG PO TABS: 4.0000 mg | ORAL_TABLET | Freq: Four times a day (QID) | ORAL | Status: DC | PRN

## 2012-11-04 MED ORDER — VITAMIN D3 25 MCG (1000 UNIT) PO TABS
1000.0000 [IU] | ORAL_TABLET | Freq: Every day | ORAL | Status: DC
  Administered 2012-11-04 – 2012-11-07 (×4): 1000 [IU] via ORAL
  Filled 2012-11-04 (×4): qty 1

## 2012-11-04 MED ORDER — HYDROCODONE-ACETAMINOPHEN 5-325 MG PO TABS: 1.0000 | ORAL_TABLET | ORAL | Status: DC | PRN

## 2012-11-04 MED ORDER — NEBIVOLOL HCL 2.5 MG PO TABS
2.5000 mg | ORAL_TABLET | Freq: Every day | ORAL | Status: DC
  Administered 2012-11-05 – 2012-11-07 (×3): 2.5 mg via ORAL
  Filled 2012-11-04 (×4): qty 1

## 2012-11-04 MED ORDER — SODIUM CHLORIDE 0.9 % IV SOLN: INTRAVENOUS | Status: DC

## 2012-11-04 MED ORDER — ACETAMINOPHEN 650 MG RE SUPP: 650.0000 mg | Freq: Four times a day (QID) | RECTAL | Status: DC | PRN

## 2012-11-04 MED ORDER — CYANOCOBALAMIN 1000 MCG/ML IJ SOLN
1000.0000 ug | INTRAMUSCULAR | Status: DC
Start: 2012-11-04 — End: 2012-11-04

## 2012-11-04 MED ORDER — ACETAMINOPHEN 325 MG PO TABS: 650.0000 mg | ORAL_TABLET | Freq: Four times a day (QID) | ORAL | Status: DC | PRN

## 2012-11-04 NOTE — ED Notes (Signed)
Pt presenting to ed with c/o black tarry stools x 2 days. Pt states history of gi bleeding. Pt denies hemoptysis pt. Pt denies nausea, vomiting, dizziness at this time. Pt denies chest pain pt states positive shortness of breath

## 2012-11-04 NOTE — ED Notes (Signed)
Transferred pt to room and connected to BP and O2.  Put in gown and socks.

## 2012-11-04 NOTE — Consult Note (Signed)
Referring Provider: No ref. provider found Primary Care Physician:  [name], MD Primary Gastroenterologist:  Dr. Leone Payor  Reason for Consultation:  GIB  HPI: [name] is a 78 y.o. 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  She has done well since that time, however; was even placed back on her coumadin eventually after developing PE's in her lungs.    She called our office this AM with reports of noticing blood in stool for 2 weeks now.  She was told to go to the ED due to her history.  The bleeding has been occurring once a day in small amounts so she was not very worried about it initially. For past 2 nights she has noticed significant amount of blood with every bowel movement and black stool, however, so she became concerned and sought evaluation.  Says that it was dark red with red/pink color in the toilet bowl and on the toilet paper.  No complaints of abdominal pain.  No nausea or vomiting.  Has also complained of DOE and some lightheadedness, which is also partially what prompted her to get evaluated.  In the ED her Hgb was normal.  She was heme positive in the ED.  She is being admitted for observation via the hospitalist service.    Past Medical History  Diagnosis Date  . Abnormal liver function test   . Osteoarthritis     back and lower extremities, neck  . Atrial fibrillation, chronic   . IBS (irritable bowel syndrome)   . Pancreatic insufficiency     ? mass on imaging notborne out on follow-up  . Gastric ulcer   . Erosive gastritis 7/10  . Vitamin B12 deficiency   . Iron deficiency anemia   . Compression fracture of C-spine   . Pulmonary embolism     bilateral   . Diverticulosis   . Internal hemorrhoids   . Anemia     post hemorragic    Past Surgical History  Procedure Date  . Tubal ligation   . Back surgery   . Left knee surgery   . Hernia  repair   . Carpal tunnel release     right  . Wrist surgery     right plate  . Bladder surgery     tact  . Ovarian cyst removal   . Esophagogastroduodenoscopy 06/2009    erosive gastritis, small ulcer, hiatal hernia (negative biopsies)  . Colonoscopy 08/2009    diverticulosis, internal hemorrhoids  . Upper gastrointestinal endoscopy 05/30/11    normal  . Colonoscopy 06/02/11    Diverticulosis  . Capsule endoscopy 06/04/11    normal  . Esophagogastroduodenoscopy 11/29/2011    Procedure: ESOPHAGOGASTRODUODENOSCOPY (EGD);  Surgeon: Louis Meckel, MD;  Location: [facility];  Service: Endoscopy;  Laterality: N/A;  . Givens capsule study 11/29/2011    Procedure: GIVENS CAPSULE STUDY;  Surgeon: Louis Meckel, MD;  Location: [facility];  Service: Endoscopy;  Laterality: N/A;  . Enteroscopy 12/02/2011    Procedure: ENTEROSCOPY;  Surgeon: Louis Meckel, MD;  Location: [facility];  Service: Endoscopy;  Laterality: N/A;  . Colonoscopy 12/03/2011    Procedure: COLONOSCOPY;  Surgeon: Louis Meckel, MD;  Location: [facility];  Service: Endoscopy;  Laterality: N/A;  . Colonoscopy 12/04/2011    Procedure: COLONOSCOPY;  Surgeon: Rob Bunting, MD;  Location: [facility];  Service: Endoscopy;  Laterality: N/A;  Prior to Admission medications   Medication Sig Start Date End Date Taking? Authorizing Provider  Calcium 1200-1000 MG-UNIT CHEW Take 1 by mouth once daily    Yes Historical Provider, MD  cholecalciferol (VITAMIN D) 1000 UNITS tablet Take 1,000 Units by mouth daily.   Yes Historical Provider, MD  cyanocobalamin (,VITAMIN B-12,) 1000 MCG/ML injection Inject 1,000 mcg into the muscle every 3 (three) months.   Yes Historical Provider, MD  cycloSPORINE (RESTASIS) 0.05 % ophthalmic emulsion Place 1 drop into both eyes 2 (two) times daily.     Yes Historical Provider, MD  digoxin (LANOXIN) 0.125 MG tablet Take 125 mcg by mouth daily.     Yes Historical Provider, MD  furosemide  (LASIX) 20 MG tablet Take 20 mg by mouth daily.    Yes Historical Provider, MD  lansoprazole (PREVACID) 30 MG capsule TAKE 1 TABLET 30 MINUTES BEFORE BREAKFAST 09/10/11  Yes Iva Boop, MD  Lutein 20 MG TABS Take 1 tablet by mouth daily.     Yes Historical Provider, MD  nebivolol (BYSTOLIC) 5 MG tablet Take 2.5 mg by mouth daily.     Yes Historical Provider, MD  Pancrelipase, Lip-Prot-Amyl, (CREON) 24000 UNITS CPEP Take by mouth. Take 1 tablet by mouth three times a day ac meals and snacks    Yes Historical Provider, MD  warfarin (COUMADIN) 2 MG tablet Take 2-3 mg by mouth daily. Takes 2 mg on Sunday, Tuesday, Wednesday, Thursday and Saturday. Takes 3mg  on Monday and Friday   Yes Zannie Cove, MD    No current facility-administered medications for this encounter.   Current Outpatient Prescriptions  Medication Sig Dispense Refill  . Calcium 1200-1000 MG-UNIT CHEW Take 1 by mouth once daily       . cholecalciferol (VITAMIN D) 1000 UNITS tablet Take 1,000 Units by mouth daily.      . cyanocobalamin (,VITAMIN B-12,) 1000 MCG/ML injection Inject 1,000 mcg into the muscle every 3 (three) months.      . cycloSPORINE (RESTASIS) 0.05 % ophthalmic emulsion Place 1 drop into both eyes 2 (two) times daily.        . digoxin (LANOXIN) 0.125 MG tablet Take 125 mcg by mouth daily.        . furosemide (LASIX) 20 MG tablet Take 20 mg by mouth daily.       . lansoprazole (PREVACID) 30 MG capsule TAKE 1 TABLET 30 MINUTES BEFORE BREAKFAST  30 capsule  11  . Lutein 20 MG TABS Take 1 tablet by mouth daily.        . nebivolol (BYSTOLIC) 5 MG tablet Take 2.5 mg by mouth daily.        . Pancrelipase, Lip-Prot-Amyl, (CREON) 24000 UNITS CPEP Take by mouth. Take 1 tablet by mouth three times a day ac meals and snacks       . warfarin (COUMADIN) 2 MG tablet Take 2-3 mg by mouth daily. Takes 2 mg on Sunday, Tuesday, Wednesday, Thursday and Saturday. Takes 3mg  on Monday and Friday        Allergies as of 11/04/2012 -  Review Complete 11/04/2012  Allergen Reaction Noted  . Meperidine hcl  06/04/2007  . Morphine sulfate  06/04/2007    Family History  Problem Relation Age of Onset  . Heart disease Mother   . Anesthesia problems Neg Hx   . Malignant hyperthermia Neg Hx   . Colon cancer Neg Hx     History   Social History  . Marital Status: Widowed  Spouse Name: N/A    Number of Children: 3  . Years of Education: N/A   Occupational History  . Retired    Social History Main Topics  . Smoking status: Never Smoker   . Smokeless tobacco: Never Used  . Alcohol Use: Yes     Comment: glass of wine occasionaly  . Drug Use: No  . Sexually Active: Not on file   Other Topics Concern  . 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  Physical Exam: Vital signs in last 24 hours: Temp:  [98 F (36.7 C)-98.2 F (36.8 C)] 98 F (36.7 C) (11/18 1250) Pulse Rate:  [56-87] 87  (11/18 1252) Resp:  [12-21] 21  (11/18 1250) BP: (119-126)/(52-75) 121/75 mmHg (11/18 1252) SpO2:  [93 %-95 %] 95 % (11/18 1250)   General:  Alert, Well-developed, well-nourished, pleasant and cooperative in NAD. Head:  Normocephalic and atraumatic. Eyes:  Sclera clear, no icterus.  Conjunctiva pink. Ears:  Normal auditory acuity. Mouth:  No deformity or lesions.   Lungs:  Clear throughout to auscultation.  No wheezes, crackles, or rhonchi.  Heart:  Regular rate and rhythm; no murmurs, clicks, rubs,  or gallops. Abdomen:  Soft, nontender, BS active, nonpalp mass or hsm.   Rectal:  Deferred.  Msk:  Symmetrical without gross deformities. Pulses:  Normal pulses noted. Extremities:  Without clubbing or edema. Neurologic:  Alert and  oriented x4;  grossly normal neurologically. Skin:  Intact without significant lesions or rashes. Psych:  Alert and cooperative. Normal mood and  affect.  Lab Results:  Basename 11/04/12 1250  WBC 4.3  HGB 13.0  HCT 38.5  PLT 200   BMET  Basename 11/04/12 1250  NA 142  K 3.7  CL 107  CO2 25  GLUCOSE 98  BUN 22  CREATININE 0.86  CALCIUM 9.1   LFT  Basename 11/04/12 1250  PROT 6.0  ALBUMIN 3.7  AST 12  ALT 8  ALKPHOS 64  BILITOT 0.7  BILIDIR --  IBILI --   PT/INR  Basename 11/04/12 1250  LABPROT 23.0*  INR 2.14*   IMPRESSION:  -GIB:  Likely lower source.  Hgb is normal.  Could be diverticular bleed vs recurrent Dieaulofoy vs other etiologies. 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 -Chronic coumadin coagulopathy -History of PE's -DOE and lightheadedness:  Doubt this is due to GIB since normal Hgb.  May need work-up per primary service.  PLAN: -Recommend monitoring the patient overnight.  She can have clear liquids.  Monitor Hgb.  Will hold off on repeat colonoscopy for now.   ZEHR, JESSICA D.  11/04/2012, 2:15 PM  Pager number [phone]   ________________________________________________________________________  [facility] MD note:  I personally examined the patient, reviewed the data and agree with the assessment and plan described above. Hb normal.  Will observe for now.   Rob Bunting, MD [facility] Gastroenterology Pager [phone]

## 2012-11-04 NOTE — Progress Notes (Signed)
PHARMACIST - PHYSICIAN ORDER COMMUNICATION  CONCERNING: P&T Medication Policy on Herbal Medications  DESCRIPTION:  This patient's order for Lutein 20 mg tabs has been noted.  This product(s) is classified as an "herbal" or natural product.  The Pharmacy and Therapeutics Committee does not permit the use of "herbal" or natural products of this type within [facility].  This policy was adopted due to the lack of definitive safety studies or FDA approval of these products, nonstandard manufacturing practices, and the potential risk of unknown drug-drug interactions with inpatient medications.   ACTION TAKEN: The pharmacy department is unable to verify this order.    Please reevaluate the patient's clinical condition at discharge and address whether or not the herbal or natural product(s) should be resumed at that time.  Polo Riley R.Ph. 11/04/2012 7:34 PM

## 2012-11-04 NOTE — ED Provider Notes (Signed)
History     CSN: [id number]  Arrival date & time 11/04/12  1116   First MD Initiated Contact with Patient 11/04/12 1204      Chief Complaint  Patient presents with  . Rectal Bleeding    (Consider location/radiation/quality/duration/timing/severity/associated sxs/prior treatment) Patient is a 78 y.o. female presenting with hematochezia. The history is provided by the patient.  Rectal Bleeding  The current episode started more than 2 weeks ago. The onset was gradual. The problem occurs continuously. The problem has been gradually worsening. The patient is experiencing no pain. The stool is described as soft and mixed with blood (Black). There was no prior unsuccessful therapy. Associated symptoms include nausea and difficulty breathing. Pertinent negatives include no anorexia, no fever, no abdominal pain, no hematemesis and no coughing. She has been behaving normally. Past medical history comments: Prior history of GI bleeding one year ago which required clamping of an artery in her colon. There were no sick contacts. She has received no recent medical care.    Past Medical History  Diagnosis Date  . Abnormal liver function test   . Osteoarthritis     back and lower extremities, neck  . Atrial fibrillation, chronic   . IBS (irritable bowel syndrome)   . Pancreatic insufficiency     ? mass on imaging notborne out on follow-up  . Gastric ulcer   . Erosive gastritis 7/10  . Vitamin B12 deficiency   . Iron deficiency anemia   . Compression fracture of C-spine   . Pulmonary embolism     bilateral   . Diverticulosis   . Internal hemorrhoids   . Anemia     post hemorragic    Past Surgical History  Procedure Date  . Tubal ligation   . Back surgery   . Left knee surgery   . Hernia repair   . Carpal tunnel release     right  . Wrist surgery     right plate  . Bladder surgery     tact  . Ovarian cyst removal   . Esophagogastroduodenoscopy 06/2009    erosive gastritis, small  ulcer, hiatal hernia (negative biopsies)  . Colonoscopy 08/2009    diverticulosis, internal hemorrhoids  . Upper gastrointestinal endoscopy 05/30/11    normal  . Colonoscopy 06/02/11    Diverticulosis  . Capsule endoscopy 06/04/11    normal  . Esophagogastroduodenoscopy 11/29/2011    Procedure: ESOPHAGOGASTRODUODENOSCOPY (EGD);  Surgeon: Louis Meckel, MD;  Location: [facility];  Service: Endoscopy;  Laterality: N/A;  . Givens capsule study 11/29/2011    Procedure: GIVENS CAPSULE STUDY;  Surgeon: Louis Meckel, MD;  Location: [facility];  Service: Endoscopy;  Laterality: N/A;  . Enteroscopy 12/02/2011    Procedure: ENTEROSCOPY;  Surgeon: Louis Meckel, MD;  Location: [facility];  Service: Endoscopy;  Laterality: N/A;  . Colonoscopy 12/03/2011    Procedure: COLONOSCOPY;  Surgeon: Louis Meckel, MD;  Location: [facility];  Service: Endoscopy;  Laterality: N/A;  . Colonoscopy 12/04/2011    Procedure: COLONOSCOPY;  Surgeon: Rob Bunting, MD;  Location: [facility];  Service: Endoscopy;  Laterality: N/A;    Family History  Problem Relation Age of Onset  . Heart disease Mother   . Anesthesia problems Neg Hx   . Malignant hyperthermia Neg Hx   . Colon cancer Neg Hx     History  Substance Use Topics  . Smoking status: Never Smoker   . Smokeless tobacco: Never Used  . Alcohol Use: Yes  Comment: glass of wine occasionaly    OB History    Grav Para Term Preterm Abortions TAB SAB Ect Mult Living                  Review of Systems  Constitutional: Negative for fever.  Respiratory: Negative for cough.   Gastrointestinal: Positive for nausea and hematochezia. Negative for abdominal pain, anorexia and hematemesis.  All other systems reviewed and are negative.    Allergies  Meperidine hcl and Morphine sulfate  Home Medications   Current Outpatient Rx  Name  Route  Sig  Dispense  Refill  . CALCIUM 1200-1000 MG-UNIT PO CHEW      Take 1 by mouth once daily           . VITAMIN D 1000 UNITS PO TABS   Oral   Take 1,000 Units by mouth daily.         . CYANOCOBALAMIN 1000 MCG/ML IJ SOLN   Intramuscular   Inject 1,000 mcg into the muscle every 3 (three) months.         . CYCLOSPORINE 0.05 % OP EMUL   Both Eyes   Place 1 drop into both eyes 2 (two) times daily.           Marland Kitchen DIGOXIN 0.125 MG PO TABS   Oral   Take 125 mcg by mouth daily.           . FUROSEMIDE 20 MG PO TABS   Oral   Take 20 mg by mouth daily.          Marland Kitchen LANSOPRAZOLE 30 MG PO CPDR      TAKE 1 TABLET 30 MINUTES BEFORE BREAKFAST   30 capsule   11   . LUTEIN 20 MG PO TABS   Oral   Take 1 tablet by mouth daily.           . NEBIVOLOL HCL 5 MG PO TABS   Oral   Take 2.5 mg by mouth daily.           Marland Kitchen PANCRELIPASE (LIP-PROT-AMYL) 24000 UNITS PO CPEP   Oral   Take by mouth. Take 1 tablet by mouth three times a day ac meals and snacks          . WARFARIN SODIUM 2 MG PO TABS   Oral   Take 2-3 mg by mouth daily. Takes 2 mg on Sunday, Tuesday, Wednesday, Thursday and Saturday. Takes 3mg  on Monday and Friday           BP 124/57  Pulse 56  Temp 98.2 F (36.8 C) (Oral)  Resp 12  SpO2 93%  Physical Exam  Nursing note and vitals reviewed. Constitutional: She is oriented to person, place, and time. She appears well-developed and well-nourished. No distress.  HENT:  Head: Normocephalic and atraumatic.  Mouth/Throat: Oropharynx is clear and moist.  Eyes: Conjunctivae normal and EOM are normal. Pupils are equal, round, and reactive to light.  Neck: Normal range of motion. Neck supple.  Cardiovascular: Normal rate and intact distal pulses.  An irregularly irregular rhythm present.  No murmur heard. Pulmonary/Chest: Effort normal and breath sounds normal. No respiratory distress. She has no wheezes. She has no rales.  Abdominal: Soft. She exhibits no distension. There is no tenderness. There is no rebound and no guarding.  Genitourinary: Rectal exam shows  no tenderness. Guaiac positive stool.       Black stools with dark blood present  Musculoskeletal: Normal range of motion. She  exhibits no edema and no tenderness.  Neurological: She is alert and oriented to person, place, and time.  Skin: Skin is warm and dry. No rash noted. No erythema. No pallor.  Psychiatric: She has a normal mood and affect. Her behavior is normal.    ED Course  Procedures (including critical care time)  Labs Reviewed  PROTIME-INR - Abnormal; Notable for the following:    Prothrombin Time 23.0 (*)     INR 2.14 (*)     All other components within normal limits  COMPREHENSIVE METABOLIC PANEL - Abnormal; Notable for the following:    GFR calc non Af Amer 59 (*)     GFR calc Af Amer 68 (*)     All other components within normal limits  CBC  TYPE AND SCREEN  OCCULT BLOOD, POC DEVICE   No results found.   Date: 11/04/2012  Rate: 76  Rhythm: atrial fibrillation  QRS Axis: normal  Intervals: normal  ST/T Wave abnormalities: nonspecific ST/T changes  Conduction Disutrbances:left bundle branch block  Narrative Interpretation:   Old EKG Reviewed: unchanged   1. GI bleeding   2. Chronic anticoagulation   3. Atrial fibrillation   4. Hemorrhage of gastrointestinal tract, unspecified   5. Irritable bowel syndrome       MDM   Patient with a prior history of GI bleeding requiring clamping of an artery in the colon last year who states for the last 2 weeks her stool has looked dark but then since Saturday has been very black and bloody. She takes Coumadin for blood clots in her INR 2 weeks ago was 2.25. Patient denies any abdominal pain but states she does feel slightly short of breath. Denying chest pain. Patient has melanotic stools on exam with dark blood present. CBC, CMP, type and cross, PT/INR pending.  Hemoccult positive for blood  2:04 PM Labs are stable.  Spoke with Shanda Bumps Dr. Marvell Fuller pa and they will follow.  Will admit to hospitalist for further  care.     Gwyneth Sprout, MD 11/04/12 1530

## 2012-11-04 NOTE — Telephone Encounter (Signed)
Patient with a history of dieulafoy's lesion of the colon, lower GI bleed x 2, and anemia from March of this year. She is on coumadin. She reports a 1 week history of "suspected bleeding", but Saturday I was sure"  She reports a large amount of bleeding since Saturday.  She is SOB and weak this am.  I have asked her to go to the ER ASAP.  She will call her ride and go to [facility].  Doug Sou, PA notified

## 2012-11-04 NOTE — H&P (Signed)
[facility] History and Physical  [name] HYQ:[id number] DOB: 05-05-1922 DOA: 11/04/2012  Referring physician: ED physician PCP: Christine Reichert, MD   Chief Complaint: bright red blood per rectum  HPI:  78 year old female with multiple medical comorbidities including but not limited to atrial fibrillation and history of pulmonary embolism (on coumadin) who presented with bright blood per rectum. Patient reported noticing blood in stool for 2 weeks now, small amount and was not very worried about it. For past 2 nights she has noticed significant amount of blood with every bowel movement and black stool. Patient reports she was once taken off of coumadin but then developed clots in her lungs. No complaints of abdominal pain, no nausea or vomiting. No complaints of chest pain, shortness of breath or palpitations.  Assessment and Plan: Principal Problem:  *Lower GI bleed - Hg and Hct stable, appreciate GI input - will monitor on regular floor - CBC in AM Active Problems:  VITAMIN B12 DEFICIENCY - continue home medication regimen   ATRIAL FIBRILLATION, CHRONIC - coumadin per pharmacy  Code Status: Full Family Communication: Pt at bedside Disposition Plan: PT evaluation   Review of Systems:  Constitutional: Negative for fever, chills and malaise/fatigue. Negative for diaphoresis.  HENT: Negative for hearing loss, ear pain, nosebleeds, congestion, sore throat, neck pain, tinnitus and ear discharge.   Eyes: Negative for blurred vision, double vision, photophobia, pain, discharge and redness.  Respiratory: Negative for cough, hemoptysis, sputum production, shortness of breath, wheezing and stridor.   Cardiovascular: Negative for chest pain, palpitations, orthopnea, claudication and leg swelling.  Gastrointestinal: Negative for nausea, vomiting and abdominal pain. Negative for heartburn, constipation, positive for blood in stool.  Genitourinary: Negative for dysuria, urgency,  frequency, hematuria and flank pain.  Musculoskeletal: Negative for myalgias, back pain, joint pain and falls.  Skin: Negative for itching and rash.  Neurological: Negative for dizziness and weakness. Negative for tingling, tremors, sensory change, speech change, focal weakness, loss of consciousness and headaches.  Endo/Heme/Allergies: Negative for environmental allergies and polydipsia. Does not bruise/bleed easily.  Psychiatric/Behavioral: Negative for suicidal ideas. The patient is not nervous/anxious.      Past Medical History  Diagnosis Date  . Abnormal liver function test   . Osteoarthritis     back and lower extremities, neck  . Atrial fibrillation, chronic   . IBS (irritable bowel syndrome)   . Pancreatic insufficiency     ? mass on imaging notborne out on follow-up  . Gastric ulcer   . Erosive gastritis 7/10  . Vitamin B12 deficiency   . Iron deficiency anemia   . Compression fracture of C-spine   . Pulmonary embolism     bilateral   . Diverticulosis   . Internal hemorrhoids   . Anemia     post hemorragic    Past Surgical History  Procedure Date  . Tubal ligation   . Back surgery   . Left knee surgery   . Hernia repair   . Carpal tunnel release     right  . Wrist surgery     right plate  . Bladder surgery     tact  . Ovarian cyst removal   . Esophagogastroduodenoscopy 06/2009    erosive gastritis, small ulcer, hiatal hernia (negative biopsies)  . Colonoscopy 08/2009    diverticulosis, internal hemorrhoids  . Upper gastrointestinal endoscopy 05/30/11    normal  . Colonoscopy 06/02/11    Diverticulosis  . Capsule endoscopy 06/04/11    normal  .  Esophagogastroduodenoscopy 11/29/2011    Procedure: ESOPHAGOGASTRODUODENOSCOPY (EGD);  Surgeon: Louis Meckel, MD;  Location: [facility];  Service: Endoscopy;  Laterality: N/A;  . Givens capsule study 11/29/2011    Procedure: GIVENS CAPSULE STUDY;  Surgeon: Louis Meckel, MD;  Location: [facility];  Service:  Endoscopy;  Laterality: N/A;  . Enteroscopy 12/02/2011    Procedure: ENTEROSCOPY;  Surgeon: Louis Meckel, MD;  Location: [facility];  Service: Endoscopy;  Laterality: N/A;  . Colonoscopy 12/03/2011    Procedure: COLONOSCOPY;  Surgeon: Louis Meckel, MD;  Location: [facility];  Service: Endoscopy;  Laterality: N/A;  . Colonoscopy 12/04/2011    Procedure: COLONOSCOPY;  Surgeon: Rob Bunting, MD;  Location: [facility];  Service: Endoscopy;  Laterality: N/A;    Social History:  reports that she has never smoked. She has never used smokeless tobacco. She reports that she drinks alcohol. She reports that she does not use illicit drugs.  Allergies  Allergen Reactions  . Meperidine Hcl     REACTION: nausea: VOMIT  . Morphine Sulfate     REACTION: nausea: "VOMIT"    Family History  Problem Relation Age of Onset  . Heart disease Mother   . Anesthesia problems Neg Hx   . Malignant hyperthermia Neg Hx   . Colon cancer Neg Hx     Medication Sig  Calcium 1200-1000 MG-UNIT CHEW Take 1 by mouth once daily   cholecalciferol (VITAMIN D) 1000 UNITS tablet Take 1,000 Units by mouth daily.  cyanocobalamin 1000 MCG/ML injection Inject 1,000 mcg into the muscle every 3 (three) months.  cycloSPORINE  0.05 % ophthalmic emulsion Place 1 drop into both eyes 2 (two) times daily.    digoxin (LANOXIN) 0.125 MG tablet Take 125 mcg by mouth daily.    furosemide (LASIX) 20 MG tablet Take 20 mg by mouth daily.   lansoprazole (PREVACID) 30 MG capsule TAKE 1 TABLET 30 MINUTES BEFORE BREAKFAST  Lutein 20 MG TABS Take 1 tablet by mouth daily.    nebivolol (BYSTOLIC) 5 MG tablet Take 2.5 mg by mouth daily.    Pancrelipase, Lip-Prot-Amyl, 24000 UNITS CPEP Take by mouth. Take 1 tablet by mouth three times a day ac meals and snacks   warfarin (COUMADIN) 2 MG tablet Take 2-3 mg by mouth daily. Takes 2 mg on Sunday, Tuesday, Wednesday, Thursday and Saturday. Takes 3mg  on Monday and Friday    Physical  Exam: Filed Vitals:   11/04/12 1130 11/04/12 1250 11/04/12 1251 11/04/12 1252  BP: 124/57 119/52 126/67 121/75  Pulse: 56 77 77 87  Temp: 98.2 F (36.8 C) 98 F (36.7 C)    TempSrc: Oral Oral    Resp: 12 21    SpO2: 93% 95%      Physical Exam  Constitutional: Appears well-developed and well-nourished. No distress.  HENT: Normocephalic. External right and left ear normal. Oropharynx is clear and moist.  Eyes: Conjunctivae and EOM are normal. PERRLA, no scleral icterus.  Neck: Normal ROM. Neck supple. No JVD. No tracheal deviation. No thyromegaly.  CVS: RRR, S1/S2 +, no murmurs, no gallops, no carotid bruit.  Pulmonary: Effort and breath sounds normal, no stridor, rhonchi, wheezes, rales.  Abdominal: Soft. BS +,  no distension, tenderness, rebound or guarding.  Musculoskeletal: Normal range of motion. No edema and no tenderness.  Lymphadenopathy: No lymphadenopathy noted, cervical, inguinal. Neuro: Alert. Normal reflexes, muscle tone coordination. No cranial nerve deficit. Skin: Skin is warm and dry. No rash noted. Not diaphoretic. No erythema. No pallor.  Psychiatric: Normal mood and affect. Behavior, judgment, thought content normal.   Labs on Admission:  Basic Metabolic Panel:  Lab 11/04/12 1610  NA 142  K 3.7  CL 107  CO2 25  GLUCOSE 98  BUN 22  CREATININE 0.86  CALCIUM 9.1   Liver Function Tests:  Lab 11/04/12 1250  AST 12  ALT 8  ALKPHOS 64  BILITOT 0.7  PROT 6.0  ALBUMIN 3.7   CBC:  Lab 11/04/12 1250  WBC 4.3  HGB 13.0  HCT 38.5  MCV 94.6  PLT 200   Radiological Exams on Admission: No results found.  EKG: atrial fibrillation  Debbora Presto, MD  [facility] Pager [phone]  If 7PM-7AM, please contact night-coverage [email or web address] Password [email or web address] 11/04/2012, 2:58 PM

## 2012-11-04 NOTE — ED Notes (Signed)
Report called to Grace RN on 4th floor. 

## 2012-11-05 DIAGNOSIS — Z7901 Long term (current) use of anticoagulants: Secondary | ICD-10-CM

## 2012-11-05 LAB — COMPREHENSIVE METABOLIC PANEL
AST: 10 U/L (ref 0–37)
Albumin: 3.1 g/dL — ABNORMAL LOW (ref 3.5–5.2)
Chloride: 106 mEq/L (ref 96–112)
Creatinine, Ser: 0.78 mg/dL (ref 0.50–1.10)
Sodium: 143 mEq/L (ref 135–145)
Total Bilirubin: 1 mg/dL (ref 0.3–1.2)

## 2012-11-05 LAB — TSH: TSH: 1.724 u[IU]/mL (ref 0.350–4.500)

## 2012-11-05 LAB — CBC
Hemoglobin: 12.5 g/dL (ref 12.0–15.0)
MCH: 31.2 pg (ref 26.0–34.0)
MCV: 96 fL (ref 78.0–100.0)
RBC: 4.01 MIL/uL (ref 3.87–5.11)

## 2012-11-05 MED ORDER — PEG-KCL-NACL-NASULF-NA ASC-C 100 G PO SOLR
0.5000 | Freq: Once | ORAL | Status: AC
  Administered 2012-11-06: 50 g via ORAL
  Filled 2012-11-05: qty 1

## 2012-11-05 MED ORDER — PEG-KCL-NACL-NASULF-NA ASC-C 100 G PO SOLR
0.5000 | Freq: Once | ORAL | Status: DC
  Filled 2012-11-05: qty 1

## 2012-11-05 MED ORDER — PEG-KCL-NACL-NASULF-NA ASC-C 100 G PO SOLR
0.5000 | Freq: Once | ORAL | Status: AC
  Administered 2012-11-05: 50 g via ORAL

## 2012-11-05 MED ORDER — SODIUM CHLORIDE 0.9 % IV SOLN
INTRAVENOUS | Status: DC
  Administered 2012-11-06: via INTRAVENOUS

## 2012-11-05 MED ORDER — PEG-KCL-NACL-NASULF-NA ASC-C 100 G PO SOLR: 0.5000 | Freq: Once | ORAL | Status: DC

## 2012-11-05 MED ORDER — SODIUM CHLORIDE 0.9 % IV SOLN
Freq: Once | INTRAVENOUS | Status: AC
  Administered 2012-11-05: 250 mL/h via INTRAVENOUS

## 2012-11-05 MED ORDER — PEG-KCL-NACL-NASULF-NA ASC-C 100 G PO SOLR: 1.0000 | Freq: Once | ORAL | Status: DC

## 2012-11-05 NOTE — Progress Notes (Signed)
Patient evaluated for long-term disease management services with [facility] Management Program as a benefit of her [employer]. Met with Mrs Shankle at bedside. She is highly independent. Explained to her [facility] Care Management services and that she will receive a post discharge transition of care call upon discharge. Left [facility] literature with patient. She was appreciative of visit.      Raiford Noble, RN,BSN, MSN [facility] Liaison [phone]

## 2012-11-05 NOTE — Progress Notes (Signed)
   CARE MANAGEMENT NOTE 11/05/2012  Patient:  [name], [name]   Account Number:  [id number]  Date Initiated:  11/05/2012  Documentation initiated by:  Jiles Crocker  Subjective/Objective Assessment:   ADMITTED WITH GIB     Action/Plan:   PCP: [name], MD  LIVES AT HOME ALONE; POSSIBLY NEED HHC AT DISCHARGE; CM WILL CONTINUE TO FOLLOW FOR DCP   Anticipated DC Date:  11/09/2012   Anticipated DC Plan:  HOME/SELF CARE      DC Planning Services  CM consult          Status of service:  In process, will continue to follow Medicare Important Message given?  NA - LOS <3 / Initial given by admissions (If response is "NO", the following Medicare IM given date fields will be blank)  Per UR Regulation:  Reviewed for med. necessity/level of care/duration of stay  Comments:  11/05/2012- B Graciela Plato RN,BSN,MHA

## 2012-11-05 NOTE — Evaluation (Signed)
Physical Therapy Evaluation Patient Details Name: [name] MRN: [id number] DOB: [birth year] Today's Date: 11/05/2012 Time: 2956-2130 PT Time Calculation (min): 12 min  PT Assessment / Plan / Recommendation Clinical Impression  Pt presents with GIB with history of afib and PE.  Tolerated ambulation in hallway very well with RW.  Initially had pt ambulate w/o AD and just one person HHA, however trasitioned to RW with marked increase in stability.  Pt will benefit from skilled PT in acute venue to address deficits.  PT recommends HHPT for follow up with intermittent supervision.      PT Assessment  Patient needs continued PT services    Follow Up Recommendations  Home health PT;Supervision - Intermittent    Does the patient have the potential to tolerate intense rehabilitation      Barriers to Discharge Decreased caregiver support Pt states that she keeps cell phone with her at all times and family checks on her intermittently. She calls her daughter everyday.     Equipment Recommendations  None recommended by PT    Recommendations for Other Services     Frequency Min 3X/week    Precautions / Restrictions     Pertinent Vitals/Pain No pain initially, however some pain in LEs from arthritis.       Mobility  Bed Mobility Bed Mobility: Not assessed Details for Bed Mobility Assistance: Pt in recliner when PT arrived.  Transfers Transfers: Sit to Stand;Stand to Sit Sit to Stand: 4: Min guard;With upper extremity assist;With armrests;From chair/3-in-1 Stand to Sit: 5: Supervision;With upper extremity assist;With armrests;To chair/3-in-1 Details for Transfer Assistance: min/guard for safety when standing with min cues for hand placement/safety.  Ambulation/Gait Ambulation/Gait Assistance: 4: Min assist;4: Min Government social research officer (Feet): 80 Feet (then another 60) Assistive device: 1 person hand held assist;Rolling walker Ambulation/Gait Assistance Details: Initially pt  ambulated w/o AD and 1 person HHA, however noted very antalgic and Trendelenburg gait pattern, therefore transitioned to RW and noted increased stability.  Highly recommended to pt that she use walker at home.  Gait Pattern: Step-through pattern;Trendelenburg;Antalgic;Decreased stride length;Lateral trunk lean to right Gait velocity: decreased Stairs: No Wheelchair Mobility Wheelchair Mobility: No    Shoulder Instructions     Exercises     PT Diagnosis: Difficulty walking;Generalized weakness;Acute pain  PT Problem List: Decreased strength;Decreased activity tolerance;Decreased balance;Decreased mobility;Decreased knowledge of use of DME;Decreased knowledge of precautions;Pain PT Treatment Interventions: DME instruction;Gait training;Functional mobility training;Therapeutic activities;Therapeutic exercise;Balance training;Patient/family education   PT Goals Acute Rehab PT Goals PT Goal Formulation: With patient Time For Goal Achievement: 11/19/12 Potential to Achieve Goals: Good Pt will go Supine/Side to Sit: with modified independence PT Goal: Supine/Side to Sit - Progress: Goal set today Pt will go Sit to Supine/Side: with modified independence PT Goal: Sit to Supine/Side - Progress: Goal set today Pt will go Sit to Stand: with modified independence PT Goal: Sit to Stand - Progress: Goal set today Pt will Ambulate: 51 - 150 feet;with modified independence;with least restrictive assistive device PT Goal: Ambulate - Progress: Goal set today  Visit Information  Last PT Received On: 11/05/12 Assistance Needed: +1    Subjective Data  Subjective: I live alone Patient Stated Goal: to get back home.    Prior Functioning  Home Living Lives With: Alone Type of Home: House Home Access: Level entry Home Layout: One level Bathroom Shower/Tub: Tub/shower unit;Walk-in shower Bathroom Toilet: Standard Home Adaptive Equipment: Straight cane;Walker - four wheeled Prior Function Level  of Independence: Independent Able to Take  Stairs?: Yes Driving: Yes Vocation: Retired Musician: No difficulties    Cognition  Overall Cognitive Status: Appears within functional limits for tasks assessed/performed Arousal/Alertness: Awake/alert Orientation Level: Appears intact for tasks assessed Behavior During Session: [name] for tasks performed    Extremity/Trunk Assessment Right Lower Extremity Assessment RLE ROM/Strength/Tone: Deficits RLE ROM/Strength/Tone Deficits: noted antalgic gait pattern on RLE, pt states she has arthritis in B LE, however R causes more pain.   RLE Sensation: WFL - Light Touch RLE Coordination: WFL - gross/fine motor Left Lower Extremity Assessment LLE ROM/Strength/Tone: WFL for tasks assessed LLE Sensation: WFL - Light Touch LLE Coordination: WFL - gross/fine motor Trunk Assessment Trunk Assessment: Kyphotic   Balance    End of Session PT - End of Session Activity Tolerance: Patient tolerated treatment well Patient left: in chair;with call bell/phone within reach Nurse Communication: Mobility status  GP     [name] 11/05/2012, 2:22 PM

## 2012-11-05 NOTE — Progress Notes (Signed)
[facility] PROGRESS NOTE  [name] ZOX:[id number] DOB: 21-Aug-1922 DOA: 11/04/2012 PCP: [name], MD  Assessment/Plan: 1. Lower GI bleed: H&H stable. Plan for colonoscopy in am as she continues to have tarry stools.  GI consult appreciated.   2. Chronic Atrial fibrillation: coumadin held for GI bleed.   3. Bilateral PE; anticoagulation held for gi bleed.   4. DVT prophylaxis.    Code Status: full code Family Communication: none at bedside Disposition Plan: 2 days.    Consultants:  Gi consult from Dr Christella Hartigan  Procedures:  Colonoscopy on 11/20  HPI/Subjective: Comfortable.  Objective: Filed Vitals:   11/05/12 0447 11/05/12 0500 11/05/12 0958 11/05/12 1445  BP: 96/42  100/53 90/54  Pulse: 55  66 68  Temp: 97.6 F (36.4 C)   97.7 F (36.5 C)  TempSrc: Oral   Oral  Resp: 18   18  Height:      Weight:  79.1 kg (174 lb 6.1 oz)    SpO2: 96%   95%    Intake/Output Summary (Last 24 hours) at 11/05/12 1937 Last data filed at 11/05/12 1900  Gross per 24 hour  Intake    240 ml  Output   2500 ml  Net  -2260 ml   Filed Weights   11/04/12 1651 11/05/12 0500  Weight: 81.012 kg (178 lb 9.6 oz) 79.1 kg (174 lb 6.1 oz)    Exam:   General:  Alert afebrile comfortable  Cardiovascular: s1s2 RRR  Respiratory: CTAB no wheezing or rhonchi  Abdomen: soft NT ND BS+  Extremities: no pedal edema.  Data Reviewed: Basic Metabolic Panel:  Lab 11/05/12 8119 11/04/12 1712 11/04/12 1250  NA 143 -- 142  K 3.5 -- 3.7  CL 106 -- 107  CO2 27 -- 25  GLUCOSE 92 -- 98  BUN 18 -- 22  CREATININE 0.78 -- 0.86  CALCIUM 9.0 -- 9.1  MG -- 2.1 --  PHOS -- 3.2 --   Liver Function Tests:  Lab 11/05/12 0454 11/04/12 1250  AST 10 12  ALT 8 8  ALKPHOS 58 64  BILITOT 1.0 0.7  PROT 5.2* 6.0  ALBUMIN 3.1* 3.7   No results found for this basename: LIPASE:5,AMYLASE:5 in the last 168 hours No results found for this basename: AMMONIA:5 in the last 168  hours CBC:  Lab 11/05/12 0454 11/04/12 1800 11/04/12 1250  WBC 3.1* DUPLICATE RN GRACE 4.3  NEUTROABS -- PENDING 2.9  HGB 12.5 DUPLICATE RN GRACE 13.0  HCT 38.5 DUPLICATE RN GRACE 38.5  MCV 96.0 DUPLICATE RN GRACE 94.6  PLT 175 DUPLICATE RN GRACE 200   Cardiac Enzymes: No results found for this basename: CKTOTAL:5,CKMB:5,CKMBINDEX:5,TROPONINI:5 in the last 168 hours BNP (last 3 results)  Basename 12/18/11 1306  PROBNP 5261.0*   CBG:  Lab 11/05/12 0755  GLUCAP 102*    No results found for this or any previous visit (from the past 240 hour(s)).   Studies: No results found.  Scheduled Meds:   . [COMPLETED] sodium chloride   Intravenous Once  . cholecalciferol  1,000 Units Oral Daily  . cycloSPORINE  1 drop Both Eyes BID  . digoxin  125 mcg Oral Daily  . furosemide  20 mg Oral Daily  . lipase/protease/amylase  2 capsule Oral TID AC  . nebivolol  2.5 mg Oral Daily  . [COMPLETED] peg 3350 powder  0.5 kit Oral Once   Followed by  . peg 3350 powder  0.5 kit Oral Once  . sodium chloride  3 mL Intravenous Q12H  . [DISCONTINUED] peg 3350 powder  1 kit Oral Once  . [DISCONTINUED] peg 3350 powder  0.5 kit Oral Once  . [DISCONTINUED] peg 3350 powder  0.5 kit Oral Once   Continuous Infusions:   Principal Problem:  *Lower GI bleed Active Problems:  VITAMIN B12 DEFICIENCY  ATRIAL FIBRILLATION, CHRONIC  Irritable bowel syndrome  PANCREATIC INSUFFICIENCY  Hemorrhage of gastrointestinal tract, Dieaulafoy's lesion hepatic flexure    Time spent: 30 min    Eion Timbrook  [facility] Pager [phone]. If 8PM-8AM, please contact night-coverage at [email or web address], password [facility] 11/05/2012, 7:37 PM  LOS: 1 day

## 2012-11-05 NOTE — Progress Notes (Signed)
Patient having loose  Bloody stools after giving first dose of Moviprep.

## 2012-11-05 NOTE — Progress Notes (Signed)
[facility] Progress Note  SUBJECTIVE: Still passing tarry stools with some red blood. Wants bed alarm off.  OBJECTIVE:  Vital signs in last 24 hours: Temp:  [97.6 F (36.4 C)-98.2 F (36.8 C)] 97.6 F (36.4 C) (11/19 0447) Pulse Rate:  [54-99] 55  (11/19 0447) Resp:  [12-21] 18  (11/19 0447) BP: (96-126)/(42-76) 96/42 mmHg (11/19 0447) SpO2:  [93 %-96 %] 96 % (11/19 0447) Weight:  [174 lb 6.1 oz (79.1 kg)-178 lb 9.6 oz (81.012 kg)] 174 lb 6.1 oz (79.1 kg) (11/19 0500) Last BM Date: 11/04/12 General:    Pleasant white female in NAD Abdomen:  Soft, nontender and nondistended. Normal bowel sounds. Extremities:  Without edema. Neurologic:  Alert and oriented,  grossly normal neurologically. Psych:  Cooperative. Normal mood and affect.  Intake/Output from previous day: 11/18 0701 - 11/19 0700 In: -  Out: 1500 [Urine:1500] Intake/Output this shift:    Lab Results:  Basename 11/05/12 0454 11/04/12 1800 11/04/12 1250  WBC 3.1* DUPLICATE RN GRACE 4.3  HGB 12.5 DUPLICATE RN GRACE 13.0  HCT 38.5 DUPLICATE RN GRACE 38.5  PLT 175 DUPLICATE RN GRACE 200   BMET  Basename 11/05/12 0454 11/04/12 1250  NA 143 142  K 3.5 3.7  CL 106 107  CO2 27 25  GLUCOSE 92 98  BUN 18 22  CREATININE 0.78 0.86  CALCIUM 9.0 9.1   LFT  Basename 11/05/12 0454  PROT 5.2*  ALBUMIN 3.1*  AST 10  ALT 8  ALKPHOS 58  BILITOT 1.0  BILIDIR --  IBILI --   PT/INR  Basename 11/04/12 1250  LABPROT 23.0*  INR 2.14*      ASSESSMENT / PLAN:   GI bleed in setting of chronic coumadin, INR 2.14. Her hemoglobin is overall stable despite fact still passing blood. Hopefully this is old blood. She has only had one episode this am. Continue to monitor.   LOS: 1 day   Willette Cluster  11/05/2012, 9:15 AM   ________________________________________________________________________  [facility] MD note:  I personally examined the patient, reviewed the data and agree with the assessment  and plan described above.  Given previous persistent bleeding last year and current persistent (low level) bleeding, I think we should proceed with colonoscopy and then EGD if completely normal.  I will set up for tomorrow AM.   Rob Bunting, MD [facility] Pager [phone]

## 2012-11-06 ENCOUNTER — Encounter (HOSPITAL_COMMUNITY): Payer: Self-pay

## 2012-11-06 ENCOUNTER — Encounter (HOSPITAL_COMMUNITY)
Admission: EM | Disposition: A | Discharge status: Home / Self Care | Payer: Self-pay | Source: Home / Self Care | Attending: Internal Medicine

## 2012-11-06 HISTORY — PX: COLONOSCOPY: SHX5424

## 2012-11-06 LAB — CBC WITH DIFFERENTIAL/PLATELET
Basophils Absolute: 0 10*3/uL (ref 0.0–0.1)
Basophils Relative: 1 % (ref 0–1)
Eosinophils Absolute: 0 10*3/uL (ref 0.0–0.7)
Eosinophils Relative: 1 % (ref 0–5)
HCT: 40.8 % (ref 36.0–46.0)
Hemoglobin: 13.7 g/dL (ref 12.0–15.0)
Lymphocytes Relative: 24 % (ref 12–46)
Lymphs Abs: 0.9 10*3/uL (ref 0.7–4.0)
MCH: 31.8 pg (ref 26.0–34.0)
MCHC: 33.6 g/dL (ref 30.0–36.0)
MCV: 94.7 fL (ref 78.0–100.0)
Monocytes Absolute: 0.4 10*3/uL (ref 0.1–1.0)
Monocytes Relative: 10 % (ref 3–12)
Neutro Abs: 2.3 10*3/uL (ref 1.7–7.7)
Neutrophils Relative %: 64 % (ref 43–77)
Platelets: 202 10*3/uL (ref 150–400)
RBC: 4.31 MIL/uL (ref 3.87–5.11)
RDW: 13.5 % (ref 11.5–15.5)
WBC: 3.7 10*3/uL — ABNORMAL LOW (ref 4.0–10.5)

## 2012-11-06 LAB — GLUCOSE, CAPILLARY: Glucose-Capillary: 98 mg/dL (ref 70–99)

## 2012-11-06 SURGERY — COLONOSCOPY
Anesthesia: Moderate Sedation

## 2012-11-06 MED ORDER — SODIUM CHLORIDE 0.9 % IJ SOLN
INTRAMUSCULAR | Status: DC | PRN
  Administered 2012-11-06: 10:00:00

## 2012-11-06 MED ORDER — FENTANYL CITRATE 0.05 MG/ML IJ SOLN
INTRAMUSCULAR | Status: DC | PRN
  Administered 2012-11-06 (×3): 25 ug via INTRAVENOUS

## 2012-11-06 MED ORDER — WARFARIN - PHARMACIST DOSING INPATIENT: Freq: Every day | Status: DC

## 2012-11-06 MED ORDER — WARFARIN - PHYSICIAN DOSING INPATIENT: Freq: Every day | Status: DC

## 2012-11-06 MED ORDER — WARFARIN SODIUM 2 MG PO TABS
2.0000 mg | ORAL_TABLET | Freq: Every day | ORAL | Status: DC
  Filled 2012-11-06: qty 1

## 2012-11-06 MED ORDER — WARFARIN SODIUM 3 MG PO TABS
3.0000 mg | ORAL_TABLET | Freq: Once | ORAL | Status: AC
  Administered 2012-11-06: 3 mg via ORAL
  Filled 2012-11-06: qty 1

## 2012-11-06 MED ORDER — DIPHENHYDRAMINE HCL 50 MG/ML IJ SOLN
INTRAMUSCULAR | Status: DC | PRN
  Administered 2012-11-06: 25 mg via INTRAVENOUS

## 2012-11-06 MED ORDER — SPOT INK MARKER SYRINGE KIT
PACK | SUBMUCOSAL | Status: DC | PRN
  Administered 2012-11-06: 1.5 mL via SUBMUCOSAL

## 2012-11-06 MED ORDER — MIDAZOLAM HCL 10 MG/2ML IJ SOLN
INTRAMUSCULAR | Status: DC | PRN
  Administered 2012-11-06 (×3): 2.5 mg via INTRAVENOUS

## 2012-11-06 NOTE — Interval H&P Note (Signed)
History and Physical Interval Note:  11/06/2012 9:12 AM  [name]  has presented today for surgery, with the diagnosis of gi bleeding  The various methods of treatment have been discussed with the patient and family. After consideration of risks, benefits and other options for treatment, the patient has consented to  Procedure(s) (LRB) with comments: ESOPHAGOGASTRODUODENOSCOPY (EGD) (N/A) COLONOSCOPY (N/A) as a surgical intervention .  The patient's history has been reviewed, patient examined, no change in status, stable for surgery.  I have reviewed the patient's chart and labs.  Questions were answered to the patient's satisfaction.     Rob Bunting

## 2012-11-06 NOTE — H&P (View-Only) (Signed)
[facility] Progress Note  SUBJECTIVE: Still passing tarry stools with some red blood. Wants bed alarm off.  OBJECTIVE:  Vital signs in last 24 hours: Temp:  [97.6 F (36.4 C)-98.2 F (36.8 C)] 97.6 F (36.4 C) (11/19 0447) Pulse Rate:  [54-99] 55  (11/19 0447) Resp:  [12-21] 18  (11/19 0447) BP: (96-126)/(42-76) 96/42 mmHg (11/19 0447) SpO2:  [93 %-96 %] 96 % (11/19 0447) Weight:  [174 lb 6.1 oz (79.1 kg)-178 lb 9.6 oz (81.012 kg)] 174 lb 6.1 oz (79.1 kg) (11/19 0500) Last BM Date: 11/04/12 General:    Pleasant white female in NAD Abdomen:  Soft, nontender and nondistended. Normal bowel sounds. Extremities:  Without edema. Neurologic:  Alert and oriented,  grossly normal neurologically. Psych:  Cooperative. Normal mood and affect.  Intake/Output from previous day: 11/18 0701 - 11/19 0700 In: -  Out: 1500 [Urine:1500] Intake/Output this shift:    Lab Results:  Basename 11/05/12 0454 11/04/12 1800 11/04/12 1250  WBC 3.1* DUPLICATE RN GRACE 4.3  HGB 12.5 DUPLICATE RN GRACE 13.0  HCT 38.5 DUPLICATE RN GRACE 38.5  PLT 175 DUPLICATE RN GRACE 200   BMET  Basename 11/05/12 0454 11/04/12 1250  NA 143 142  K 3.5 3.7  CL 106 107  CO2 27 25  GLUCOSE 92 98  BUN 18 22  CREATININE 0.78 0.86  CALCIUM 9.0 9.1   LFT  Basename 11/05/12 0454  PROT 5.2*  ALBUMIN 3.1*  AST 10  ALT 8  ALKPHOS 58  BILITOT 1.0  BILIDIR --  IBILI --   PT/INR  Basename 11/04/12 1250  LABPROT 23.0*  INR 2.14*      ASSESSMENT / PLAN:   GI bleed in setting of chronic coumadin, INR 2.14. Her hemoglobin is overall stable despite fact still passing blood. Hopefully this is old blood. She has only had one episode this am. Continue to monitor.   LOS: 1 day   Paula Guenther  11/05/2012, 9:15 AM   ________________________________________________________________________  [facility] MD note:  I personally examined the patient, reviewed the data and agree with the assessment  and plan described above.  Given previous persistent bleeding last year and current persistent (low level) bleeding, I think we should proceed with colonoscopy and then EGD if completely normal.  I will set up for tomorrow AM.   Daniel Jacobs, MD [facility] Pager [phone] 

## 2012-11-06 NOTE — Progress Notes (Addendum)
11/05/12 1422  PT G-Codes **NOT FOR INPATIENT CLASS**  Functional Assessment Tool Used clinical judgement based upon clear diocumentation in chart review  Functional Limitation Mobility: Walking and moving around  Mobility: Walking and Moving Around Current Status (Z6109) CI  Mobility: Walking and Moving Around Goal Status (U0454) CH   g-codes entered by Marella Bile, Pt upon chart review based upon clear documentation from Eval performed by EmilyPage, PT.

## 2012-11-06 NOTE — Progress Notes (Signed)
ANTICOAGULATION CONSULT NOTE - Initial Consult  Pharmacy Consult for Warfarin Indication: afib, h/o PE  Allergies  Allergen Reactions  . Meperidine Hcl     REACTION: nausea: VOMIT  . Morphine Sulfate     REACTION: nausea: "VOMIT"    Patient Measurements: Height: 5\' 4"  (162.6 cm) Weight: 174 lb 6.1 oz (79.1 kg) (bed scale) IBW/kg (Calculated) : 54.7   Vital Signs: Temp: 97.5 F (36.4 C) (11/20 1026) Temp src: Oral (11/20 1026) BP: 124/66 mmHg (11/20 1100) Pulse Rate: 61  (11/20 0516)  Labs:  Basename 11/06/12 1255 11/06/12 0758 11/05/12 0454 11/04/12 1800 11/04/12 1711 11/04/12 1250  HGB -- 13.7 12.5 -- -- --  HCT -- 40.8 38.5 DUPLICATE RN GRACE -- --  PLT -- 202 175 DUPLICATE RN GRACE -- --  APTT -- -- -- -- 34 --  LABPROT 18.6* -- -- -- -- 23.0*  INR 1.61* -- -- -- -- 2.14*  HEPARINUNFRC -- -- -- -- -- --  CREATININE -- -- 0.78 -- -- 0.86  CKTOTAL -- -- -- -- -- --  CKMB -- -- -- -- -- --  TROPONINI -- -- -- -- -- --    Estimated Creatinine Clearance: 49.5 ml/min (by C-G formula based on Cr of 0.78).   Medical History: Past Medical History  Diagnosis Date  . Abnormal liver function test   . Osteoarthritis     back and lower extremities, neck  . Atrial fibrillation, chronic   . IBS (irritable bowel syndrome)   . Pancreatic insufficiency     ? mass on imaging notborne out on follow-up  . Gastric ulcer   . Erosive gastritis 7/10  . Vitamin B12 deficiency   . Iron deficiency anemia   . Compression fracture of C-spine   . Pulmonary embolism     bilateral   . Diverticulosis   . Internal hemorrhoids   . Anemia     post hemorragic  . Fibromyalgia     Assessment:  89 YOF on chronic warfarin PTA for atrial fibrillation.  Warfarin was held on admission when patient presented to ED with black tarry stools.  INR was 2.14 on admission and Hgb remained WNL.    Colonoscopy performed today and GI recommending to restart warfarin therapy today since GI bleed  stable and given patient's hx of PE while off warfarin therapy before.  Home warfarin dose: 2 mg daily except takes 3 mg on Mondays/Fridays.  INR 1.61 this AM.  Goal of Therapy:  INR 2-3   Plan:  Warfarin 3 mg po once today. Daily INR.  Clance Boll 11/06/2012,1:37 PM

## 2012-11-06 NOTE — Progress Notes (Signed)
[facility] PROGRESS NOTE  [name] GEX:[id number] DOB: May 26, 1922 DOA: 11/04/2012 PCP: [name], MD  Assessment/Plan: Principal Problem:  *Lower GI bleed Active Problems:  VITAMIN B12 DEFICIENCY  ATRIAL FIBRILLATION, CHRONIC  Irritable bowel syndrome  PANCREATIC INSUFFICIENCY  Hemorrhage of gastrointestinal tract, Dieaulafoy's lesion hepatic flexure    1. Lower GI bleed: Patient presented with dark stools and hematochezia, of several days duration, worse in the couple of day, preceding admission, against a background of Coumadin-induced coagulopathy, with INR of 2.14. Fortunately, hemoglobin has remained normal, and patient showed no evidence of hemodynamic instability. Dr Rob Bunting provided GI consultation, and performed a colonoscopy on 11/06/12, which demonstrated several small left sided diverticulum, 3-4 small sessile polyps that were not removed, red blood throughout colon, most signficant in right colon. In the area of the hepatic flexure, there were two adjacent pinpoints of active oozing felt to be in same location as the 2012 bleeding. These were addressed three endoclips, injected with 2cc of dilute Epinephrine and labeled with injection of [place] Ink. 2. Chronic Atrial fibrillation: Patient has known history of chronic atrial fibrillation. She has remained rate-controlled during her hospitalization. Coumadin was of course, held on admission, due to GI bleed. Per GI recommendation, this has been re-started today.  3. History of Bilateral PE: As described above, anticoagulation was briefly held held for GI  bleed.      Code Status: Full Code.  Family Communication:  Disposition Plan: Per GI.     Brief narrative: 78 year old female with history of chronic atrial fibrillation on Coumadin, IBS, pancreatic insufficiency, previous gastric ulcer/erosive gastritis, diverticulosis, iron deficiency anemia/vit B12 deficiency, previous bilateral PE, presenting  with bright blood per rectum. Patient reported noticing blood in stool for 2 weeks, initially in small amount and was not very worried about it. For last 2 nights she has noticed significant amount of blood with every bowel movement and black stools. According to patient, she was once taken off Coumadin in 2012, but then developed clots in her lungs. No complaints of abdominal pain, no nausea or vomiting. No complaints of chest pain, shortness of breath or palpitations. She was admitted for further management.   Consultants:  Dr Rob Bunting, gastroenterologist.   Procedures:  Colonoscopy 11/06/12.  Antibiotics:  N/A.   HPI/Subjective: No new issues.   Objective: Vital signs in last 24 hours: Temp:  [97.5 F (36.4 C)-97.7 F (36.5 C)] 97.5 F (36.4 C) (11/20 1026) Pulse Rate:  [55-68] 61  (11/20 0516) Resp:  [15-68] 24  (11/20 1100) BP: (65-128)/(36-78) 124/66 mmHg (11/20 1100) SpO2:  [91 %-98 %] 91 % (11/20 1100) Weight change:  Last BM Date: 11/06/12  Intake/Output from previous day: 11/19 0701 - 11/20 0700 In: 1303.3 [P.O.:1170; I.V.:133.3] Out: 1000 [Urine:1000]     Physical Exam: General: Comfortable, alert, communicative, fully oriented, not short of breath at rest.  HEENT:  No clinical pallor, no jaundice, no conjunctival injection or discharge. Hydration is fair.  NECK:  Supple, JVP not seen, no carotid bruits, no palpable lymphadenopathy, no palpable goiter. CHEST:  Clinically clear to auscultation, no wheezes, no crackles. HEART:  Sounds 1 and 2 heard, normal, irregular, no murmurs. ABDOMEN:  Full, soft, non-tender, no palpable organomegaly, no palpable masses, normal bowel sounds. GENITALIA:  Not examined. LOWER EXTREMITIES:  No pitting edema, palpable peripheral pulses. MUSCULOSKELETAL SYSTEM:  Generalized osteoarthritic changes, otherwise, normal. CENTRAL NERVOUS SYSTEM:  No focal neurologic deficit on gross examination.  Lab Results:  [facility]  11/06/12  4098 11/05/12 0454  WBC 3.7* 3.1*  HGB 13.7 12.5  HCT 40.8 38.5  PLT 202 175    Basename 11/05/12 0454 11/04/12 1250  NA 143 142  K 3.5 3.7  CL 106 107  CO2 27 25  GLUCOSE 92 98  BUN 18 22  CREATININE 0.78 0.86  CALCIUM 9.0 9.1   No results found for this or any previous visit (from the past 240 hour(s)).   Studies/Results: No results found.  Medications: Scheduled Meds:   . [COMPLETED] sodium chloride   Intravenous Once  . cholecalciferol  1,000 Units Oral Daily  . cycloSPORINE  1 drop Both Eyes BID  . digoxin  125 mcg Oral Daily  . furosemide  20 mg Oral Daily  . lipase/protease/amylase  2 capsule Oral TID AC  . nebivolol  2.5 mg Oral Daily  . [COMPLETED] peg 3350 powder  0.5 kit Oral Once   Followed by  . [COMPLETED] peg 3350 powder  0.5 kit Oral Once  . sodium chloride  3 mL Intravenous Q12H  . warfarin  2 mg Oral q1800  . Warfarin - Physician Dosing Inpatient   Does not apply q1800  . [DISCONTINUED] peg 3350 powder  1 kit Oral Once  . [DISCONTINUED] peg 3350 powder  0.5 kit Oral Once  . [DISCONTINUED] peg 3350 powder  0.5 kit Oral Once   Continuous Infusions:   . [DISCONTINUED] sodium chloride 20 mL/hr at 11/06/12 0020   PRN Meds:.acetaminophen, acetaminophen, HYDROcodone-acetaminophen, ondansetron (ZOFRAN) IV, ondansetron, [DISCONTINUED] diphenhydrAMINE, [DISCONTINUED] EPINEPHrine 1:10,000, 10 mL syringe/NS, 10 mL vial for sclerotherapy inj mixture, [DISCONTINUED] fentaNYL, [DISCONTINUED] midazolam, [DISCONTINUED] spot ink marker    LOS: 2 days   [place] Cawood,CHRISTOPHER  [facility] Pager [phone]. If 8PM-8AM, please contact night-coverage at [email or web address], password [facility] 11/06/2012, 11:30 AM  LOS: 2 days

## 2012-11-06 NOTE — Op Note (Signed)
[facility] [address] [place], [address]   COLONOSCOPY PROCEDURE REPORT  PATIENT: [name], [name]  MR#: [id number] BIRTHDATE: 1922/01/14 , 89  yrs. old GENDER: Female ENDOSCOPIST: Rachael Fee, MD PROCEDURE DATE:  11/06/2012 PROCEDURE:   Colonoscopy with control of bleeding and Submucosal injection, any substance ASA CLASS:   Class III INDICATIONS:12/12 GI bleeding (underwent several tests, eventually a pinpoint area at hepatic flexure was seen clearly oozing, clipped by colonoscopy).  She is on coumadin and when stopped in 2012 she had PE; repeat bleeding past 2-3 days without drop in Hb. MEDICATIONS: Fentanyl 75 mcg IV, Versed 7.5 mg IV, and Benadryl 25 mg IV  DESCRIPTION OF PROCEDURE:   After the risks benefits and alternatives of the procedure were thoroughly explained, informed consent was obtained.  A digital rectal exam revealed no abnormalities of the rectum.   The Pentax Ped Colon [id number] endoscope was introduced through the anus and advanced to the cecum, which was identified by both the appendix and ileocecal valve. No adverse events experienced.   The quality of the prep was good, using MoviPrep  The instrument was then slowly withdrawn as the colon was fully examined.  COLON FINDINGS: There were several small left sided diverticulum. There were 3-4 small sessile polyps that were not removed.  There was red blood throughout colon, most signficant in right colon.  In area of hepatic flexure, there were two adjacent pinpoints of active oozing.  This was felt to be in same location as the 2012 bleeding.  I placed three endoclips at the site and given (slower but persistent) oozing I injected 2cc of dilute epinephrine at the site.  Finally, I labeled site with injection of [place] Ink. Retroflexed views revealed no abnormalities. The time to cecum=4 minutes 00 seconds.  Withdrawal time=20 minutes 0 seconds.  The scope was withdrawn and the  procedure completed. COMPLICATIONS: There were no complications. ENDOSCOPIC IMPRESSION: There were several small left sided diverticulum.  There were 3-4 small sessile polyps that were not removed.  There was red blood throughout colon, most signficant in right colon.  In area of hepatic flexure, there were two adjacent pinpoints of active oozing.  This was felt to be in same location as the 2012 bleeding. I placed three endoclips at the site and given (slower but persistent) oozing I injected 2cc of dilute epinephrine at the site.  Finally, I labeled site with injection of [place] Ink.  RECOMMENDATIONS: Observe clinically.  OK to restart coumadin today given her PEs while briefly off coumadin in 2012.  If she continues to bleed clinically, then interventional radiology attempt to stop the oozing.   eSigned:  Rachael Fee, MD 11/06/2012 10:30 AM

## 2012-11-07 ENCOUNTER — Encounter (HOSPITAL_COMMUNITY): Payer: Self-pay | Admitting: Gastroenterology

## 2012-11-07 LAB — CBC
HCT: 39.5 % (ref 36.0–46.0)
MCHC: 32.7 g/dL (ref 30.0–36.0)
MCV: 95.4 fL (ref 78.0–100.0)
RDW: 13.8 % (ref 11.5–15.5)
WBC: 4.8 10*3/uL (ref 4.0–10.5)

## 2012-11-07 MED ORDER — WARFARIN SODIUM 3 MG PO TABS
3.0000 mg | ORAL_TABLET | Freq: Once | ORAL | Status: DC
  Filled 2012-11-07: qty 1

## 2012-11-07 NOTE — Progress Notes (Signed)
Physical Therapy Treatment Patient Details Name: [name] MRN: [id number] DOB: June 14, 1922 Today's Date: 11/07/2012 Time: 1308-6578 PT Time Calculation (min): 11 min  PT Assessment / Plan / Recommendation Comments on Treatment Session  Pt progressing well.  Noted some SOB during ambulation with pt stating that this happens at home too.  Educated on energy conservation techniques for home.      Follow Up Recommendations  Home health PT;Supervision - Intermittent     Does the patient have the potential to tolerate intense rehabilitation     Barriers to Discharge        Equipment Recommendations  None recommended by PT    Recommendations for Other Services    Frequency Min 3X/week   Plan Discharge plan remains appropriate    Precautions / Restrictions Precautions Precautions: Fall Restrictions Weight Bearing Restrictions: No   Pertinent Vitals/Pain No pain    Mobility  Bed Mobility Bed Mobility: Not assessed Details for Bed Mobility Assistance: Pt in recliner when PT arrived.  Transfers Transfers: Sit to Stand;Stand to Sit Sit to Stand: 5: Supervision Stand to Sit: 5: Supervision;With upper extremity assist;With armrests;To chair/3-in-1 Details for Transfer Assistance: Supervision for safety.  Pt demos good use of UEs.  Ambulation/Gait Ambulation/Gait Assistance: 5: Supervision Ambulation Distance (Feet): 200 Feet Assistive device: Rolling walker Ambulation/Gait Assistance Details: Min cues for maintaining position inside of RW.  Educated on standing rest breaks due to some noted SOB with ambulation.   Gait Pattern: Step-through pattern;Trendelenburg;Antalgic;Decreased stride length;Lateral trunk lean to right Gait velocity: decreased Stairs: No Wheelchair Mobility Wheelchair Mobility: No    Exercises     PT Diagnosis:    PT Problem List:   PT Treatment Interventions:     PT Goals Acute Rehab PT Goals PT Goal Formulation: With patient Time For Goal  Achievement: 11/19/12 Potential to Achieve Goals: Good Pt will go Sit to Stand: with modified independence PT Goal: Sit to Stand - Progress: Progressing toward goal Pt will Ambulate: 51 - 150 feet;with modified independence;with least restrictive assistive device PT Goal: Ambulate - Progress: Progressing toward goal  Visit Information  Last PT Received On: 11/07/12 Assistance Needed: +1    Subjective Data  Subjective: I'm ready to get back home.  Patient Stated Goal: to get back home.    Cognition  Overall Cognitive Status: Appears within functional limits for tasks assessed/performed Arousal/Alertness: Awake/alert Orientation Level: Appears intact for tasks assessed Behavior During Session: [facility] for tasks performed    Balance     End of Session PT - End of Session Activity Tolerance: Patient limited by fatigue Patient left: in chair;with call bell/phone within reach Nurse Communication: Mobility status   GP Functional Assessment Tool Used: clinical judgement based upon clear diocumentation in chart review Functional Limitation: Mobility: Walking and moving around Mobility: Walking and Moving Around Current Status ([id number]): At least 1 percent but less than 20 percent impaired, limited or restricted Mobility: Walking and Moving Around Goal Status [phone]): 0 percent impaired, limited or restricted Mobility: Walking and Moving Around Discharge Status [id number]): At least 1 percent but less than 20 percent impaired, limited or restricted   Lessie Dings 11/07/2012, 3:48 PM

## 2012-11-07 NOTE — Progress Notes (Signed)
[facility] Progress Note    Since last GI note: Colonoscopy yesterday, treated hepatic flexure bleeding with endoclips, epinephrine injection.  No overt bleeding (or BM) overnight. Slept well. Feels overall pretty well.  Objective: Vital signs in last 24 hours: Temp:  [97.5 F (36.4 C)-97.8 F (36.6 C)] 97.5 F (36.4 C) (11/21 0512) Pulse Rate:  [61-62] 62  (11/21 0512) Resp:  [15-68] 18  (11/21 0512) BP: (65-128)/(36-78) 100/51 mmHg (11/21 0512) SpO2:  [91 %-98 %] 94 % (11/21 0512) Last BM Date: 11/06/12 General: alert and oriented times 3 Heart: regular rate and rythm Abdomen: soft, non-tender, non-distended, normal bowel sounds   Lab Results:  Basename 11/07/12 0445 11/06/12 0758 11/05/12 0454  WBC 4.8 3.7* 3.1*  HGB 12.9 13.7 12.5  PLT 202 202 175  MCV 95.4 94.7 96.0    Basename 11/05/12 0454 11/04/12 1250  NA 143 142  K 3.5 3.7  CL 106 107  CO2 27 25  GLUCOSE 92 98  BUN 18 22  CREATININE 0.78 0.86  CALCIUM 9.0 9.1    Basename 11/05/12 0454 11/04/12 1250  PROT 5.2* 6.0  ALBUMIN 3.1* 3.7  AST 10 12  ALT 8 8  ALKPHOS 58 64  BILITOT 1.0 0.7  BILIDIR -- --  IBILI -- --    Basename 11/07/12 0445 11/06/12 1255  INR 1.64* 1.61*    Medications: Scheduled Meds:   . cholecalciferol  1,000 Units Oral Daily  . cycloSPORINE  1 drop Both Eyes BID  . digoxin  125 mcg Oral Daily  . furosemide  20 mg Oral Daily  . lipase/protease/amylase  2 capsule Oral TID AC  . nebivolol  2.5 mg Oral Daily  . sodium chloride  3 mL Intravenous Q12H  . [COMPLETED] warfarin  3 mg Oral ONCE-1800  . Warfarin - Pharmacist Dosing Inpatient   Does not apply q1800  . [DISCONTINUED] warfarin  2 mg Oral q1800  . [DISCONTINUED] Warfarin - Physician Dosing Inpatient   Does not apply q1800   Continuous Infusions:   . [DISCONTINUED] sodium chloride 20 mL/hr at 11/06/12 0020   PRN Meds:.acetaminophen, acetaminophen, HYDROcodone-acetaminophen, ondansetron (ZOFRAN) IV,  ondansetron, [DISCONTINUED] diphenhydrAMINE, [DISCONTINUED] EPINEPHrine 1:10,000, 10 mL syringe/NS, 10 mL vial for sclerotherapy inj mixture, [DISCONTINUED] fentaNYL, [DISCONTINUED] midazolam, [DISCONTINUED] spot ink marker    Assessment/Plan: 78 y.o. female with recurrent colonic bleeding from hepatic flexure; likely Dielofoy Lesion.  Has not required bloodtransfusion this admit.  OK to d/c home if Breakfast and lunch go well today.  Follow up as needed with GI. She should stay on coumadin at her usual dose (restarted yesterday and INR 1.6 today).    Rob Bunting, MD  11/07/2012, 7:30 AM [facility] Pager [phone]

## 2012-11-07 NOTE — Discharge Summary (Signed)
Physician Discharge Summary  [name]:[id number] DOB: 07-Sep-1922 DOA: 11/04/2012  PCP: [name], MD  Admit date: 11/04/2012 Discharge date: 11/07/2012  Time spent: 40 minutes  Recommendations for Outpatient Follow-up:  1. Follow up with PMD. 2. Follow up with Dr Stan Head, gastroenterologist.   Discharge Diagnoses:  Principal Problem:  *Lower GI bleed Active Problems:  VITAMIN B12 DEFICIENCY  ATRIAL FIBRILLATION, CHRONIC  Irritable bowel syndrome  PANCREATIC INSUFFICIENCY  Hemorrhage of gastrointestinal tract, Dieaulafoy's lesion hepatic flexure   Discharge Condition: Satisfactory.   Diet recommendation: Heart-Healthy.   Filed Weights   11/04/12 1651 11/05/12 0500  Weight: 81.012 kg (178 lb 9.6 oz) 79.1 kg (174 lb 6.1 oz)    History of present illness:  78 year old female with history of chronic atrial fibrillation on Coumadin, IBS, pancreatic insufficiency, previous gastric ulcer/erosive gastritis, diverticulosis, iron deficiency anemia/vit B12 deficiency, previous bilateral PE, presenting with bright blood per rectum. Patient reported noticing blood in stool for 2 weeks, initially in small amount and was not very worried about it. For last 2 nights she has noticed significant amount of blood with every bowel movement and black stools. According to patient, she was once taken off Coumadin in 2012, but then developed clots in her lungs. No complaints of abdominal pain, no nausea or vomiting. No complaints of chest pain, shortness of breath or palpitations. She was admitted for further management.   Hospital Course:  1. Lower GI bleed: Patient presented with dark stools and hematochezia, of several days duration, worse in the couple of days, preceding admission, against a background of Coumadin-induced coagulopathy, with INR of 2.14. Fortunately, hemoglobin has remained normal throughout her hospitalization, and patient showed no evidence of hemodynamic  instability. Dr Rob Bunting provided GI consultation, and performed a colonoscopy on 11/06/12, which demonstrated several small left sided diverticulum, 3-4 small sessile polyps that were not removed, red blood throughout colon, most signficant in right colon. In the area of the hepatic flexure, there were two adjacent pinpoints of active oozing felt to be in same location as the 2012 bleeding and consistent with Dielofoy Lesion. These were addressed three endoclips, injected with 2cc of dilute Epinephrine and labeled with injection of [place] Ink. Patient had no recurrence of GI bleed, thereafter. As of 11/07/12, she was asymptomatic, tolerated diet, and hemoglobin was 12.9. Patient will follow up with her primary gastroenterologist Dr Stan Head, on discharge.  2. Chronic Atrial fibrillation: Patient has known history of chronic atrial fibrillation. She has remained rate-controlled during her hospitalization. Coumadin was of course, held on admission, due to GI bleed. Per GI recommendation, was re-started on 11/06/12, without deleterious effect.  3. History of Bilateral PE: As described above, anticoagulation was briefly held held for GI bleed. Now re-instated.    Procedures: Colonoscopy 11/06/12.  Consultations: Dr Rob Bunting, gastroenterologist.   Discharge Exam: Filed Vitals:   11/06/12 1100 11/06/12 1348 11/06/12 2139 11/07/12 0512  BP: 124/66 105/52 105/57 100/51  Pulse:   61 62  Temp:  97.8 F (36.6 C) 97.8 F (36.6 C) 97.5 F (36.4 C)  TempSrc:  Oral Oral Oral  Resp: 24 18 18 18   Height:      Weight:      SpO2: 91% 94% 93% 94%   General: Comfortable, alert, communicative, fully oriented, not short of breath at rest.  HEENT: No clinical pallor, no jaundice, no conjunctival injection or discharge. Hydration is fair.  NECK: Supple, JVP not seen, no carotid bruits, no palpable lymphadenopathy,  no palpable goiter.  CHEST: Clinically clear to auscultation, no wheezes, no  crackles.  HEART: Sounds 1 and 2 heard, normal, irregular, no murmurs.  ABDOMEN: Full, soft, non-tender, no palpable organomegaly, no palpable masses, normal bowel sounds.  GENITALIA: Not examined.  LOWER EXTREMITIES: No pitting edema, palpable peripheral pulses.  MUSCULOSKELETAL SYSTEM: Generalized osteoarthritic changes, otherwise, normal.  CENTRAL NERVOUS SYSTEM: No focal neurologic deficit on gross examination.  Discharge Instructions      Discharge Orders    Future Appointments: Provider: Department: Dept Phone: Center:   12/24/2012 2:00 PM [facility] Nurse [facility] [phone] [facility]     Future Orders Please Complete By Expires   Diet - low sodium heart healthy      Increase activity slowly          Medication List     As of 11/07/2012  2:15 PM    TAKE these medications         Calcium 1200-1000 MG-UNIT Chew   Take 1 by mouth once daily      cholecalciferol 1000 UNITS tablet   Commonly known as: VITAMIN D   Take 1,000 Units by mouth daily.      CREON 24000 UNITS Cpep   Generic drug: Pancrelipase (Lip-Prot-Amyl)   Take by mouth. Take 1 tablet by mouth three times a day ac meals and snacks      cyanocobalamin 1000 MCG/ML injection   Commonly known as: (VITAMIN B-12)   Inject 1,000 mcg into the muscle every 3 (three) months.      cycloSPORINE 0.05 % ophthalmic emulsion   Commonly known as: RESTASIS   Place 1 drop into both eyes 2 (two) times daily.      digoxin 0.125 MG tablet   Commonly known as: LANOXIN   Take 125 mcg by mouth daily.      furosemide 20 MG tablet   Commonly known as: LASIX   Take 20 mg by mouth daily.      lansoprazole 30 MG capsule   Commonly known as: PREVACID   TAKE 1 TABLET 30 MINUTES BEFORE BREAKFAST      Lutein 20 MG Tabs   Take 1 tablet by mouth daily.      nebivolol 5 MG tablet   Commonly known as: BYSTOLIC   Take 2.5 mg by mouth daily.      warfarin 2 MG tablet   Commonly known as: COUMADIN    Take 2-3 mg by mouth daily. Takes 2 mg on Sunday, Tuesday, Wednesday, Thursday and Saturday. Takes 3mg  on Monday and Friday         Follow-up Information    Schedule an appointment as soon as possible for a visit with [name], MD.   Contact information:   [address] [place] [address] [phone]       Call Stan Head, MD. (within 1 week. )    Contact information:   [address] [place] [address] [phone]           The results of significant diagnostics from this hospitalization (including imaging, microbiology, ancillary and laboratory) are listed below for reference.    Significant Diagnostic Studies: No results found.  Microbiology: No results found for this or any previous visit (from the past 240 hour(s)).   Labs: Basic Metabolic Panel:  Lab 11/05/12 2841 11/04/12 1712 11/04/12 1250  NA 143 -- 142  K 3.5 -- 3.7  CL 106 -- 107  CO2  27 -- 25  GLUCOSE 92 -- 98  BUN 18 -- 22  CREATININE 0.78 -- 0.86  CALCIUM 9.0 -- 9.1  MG -- 2.1 --  PHOS -- 3.2 --   Liver Function Tests:  Lab 11/05/12 0454 11/04/12 1250  AST 10 12  ALT 8 8  ALKPHOS 58 64  BILITOT 1.0 0.7  PROT 5.2* 6.0  ALBUMIN 3.1* 3.7   No results found for this basename: LIPASE:5,AMYLASE:5 in the last 168 hours No results found for this basename: AMMONIA:5 in the last 168 hours CBC:  Lab 11/07/12 0445 11/06/12 0758 11/05/12 0454 11/04/12 1800 11/04/12 1250  WBC 4.8 3.7* 3.1* DUPLICATE RN GRACE 4.3  NEUTROABS -- 2.3 -- PENDING 2.9  HGB 12.9 13.7 12.5 DUPLICATE RN GRACE 13.0  HCT 39.5 40.8 38.5 DUPLICATE RN GRACE 38.5  MCV 95.4 94.7 96.0 DUPLICATE RN GRACE 94.6  PLT 202 202 175 DUPLICATE RN GRACE 200   Cardiac Enzymes: No results found for this basename: CKTOTAL:5,CKMB:5,CKMBINDEX:5,TROPONINI:5 in the last 168 hours BNP: BNP (last 3 results)  Basename 12/18/11 1306  PROBNP 5261.0*   CBG:  Lab 11/06/12 0743 11/05/12 0755  GLUCAP  98 102*       Signed:  Anthonny Schiller,CHRISTOPHER  [facility] 11/07/2012, 2:15 PM

## 2012-11-07 NOTE — Progress Notes (Signed)
ANTICOAGULATION CONSULT NOTE - Follow Up  Pharmacy Consult for Warfarin Indication: afib, h/o PE  Allergies  Allergen Reactions  . Meperidine Hcl     REACTION: nausea: VOMIT  . Morphine Sulfate     REACTION: nausea: "VOMIT"    Patient Measurements: Height: 5\' 4"  (162.6 cm) Weight: 174 lb 6.1 oz (79.1 kg) (bed scale) IBW/kg (Calculated) : 54.7   Vital Signs: Temp: 97.5 F (36.4 C) (11/21 0512) Temp src: Oral (11/21 0512) BP: 100/51 mmHg (11/21 0512) Pulse Rate: 62  (11/21 0512)  Labs:  Basename 11/07/12 0445 11/06/12 1255 11/06/12 0758 11/05/12 0454 11/04/12 1711 11/04/12 1250  HGB 12.9 -- 13.7 -- -- --  HCT 39.5 -- 40.8 38.5 -- --  PLT 202 -- 202 175 -- --  APTT -- -- -- -- 34 --  LABPROT 18.9* 18.6* -- -- -- 23.0*  INR 1.64* 1.61* -- -- -- 2.14*  HEPARINUNFRC -- -- -- -- -- --  CREATININE -- -- -- 0.78 -- 0.86  CKTOTAL -- -- -- -- -- --  CKMB -- -- -- -- -- --  TROPONINI -- -- -- -- -- --    Estimated Creatinine Clearance: 49.5 ml/min (by C-G formula based on Cr of 0.78).   Medical History: Past Medical History  Diagnosis Date  . Abnormal liver function test   . Osteoarthritis     back and lower extremities, neck  . Atrial fibrillation, chronic   . IBS (irritable bowel syndrome)   . Pancreatic insufficiency     ? mass on imaging notborne out on follow-up  . Gastric ulcer   . Erosive gastritis 7/10  . Vitamin B12 deficiency   . Iron deficiency anemia   . Compression fracture of C-spine   . Pulmonary embolism     bilateral   . Diverticulosis   . Internal hemorrhoids   . Anemia     post hemorragic  . Fibromyalgia     Assessment:  89 YOF on chronic warfarin PTA for atrial fibrillation.  Warfarin was held on admission when patient presented to ED with black tarry stools.  INR was 2.14 on admission and Hgb remained WNL.  Last warfarin dose reported as "past week."  Colonoscopy performed 11/20 and GI recommended to restart warfarin therapy since GI  bleed stable and given patient's hx of PE while off warfarin therapy before.  Home warfarin dose: 2 mg daily except takes 3 mg on Mondays/Fridays.  INR 1.64 this AM.    Goal of Therapy:  INR 2-3   Plan:  Warfarin 3 mg po once today. Daily INR.  Clance Boll 11/07/2012,12:48 PM

## 2012-11-08 ENCOUNTER — Telehealth: Payer: Self-pay | Admitting: Internal Medicine

## 2012-11-08 NOTE — Telephone Encounter (Signed)
Patient will need follow up the week of 11/18/12.  She is advised that I will call her next week on Monday or Tuesday with an appt

## 2012-11-12 NOTE — Telephone Encounter (Signed)
Patient is scheduled for follow up with Willette Cluster RNP on 11/18/12 1:30

## 2012-11-18 ENCOUNTER — Ambulatory Visit (INDEPENDENT_AMBULATORY_CARE_PROVIDER_SITE_OTHER): Payer: Medicare Other | Admitting: Nurse Practitioner

## 2012-11-18 ENCOUNTER — Encounter: Payer: Self-pay | Admitting: Nurse Practitioner

## 2012-11-18 VITALS — BP 92/52 | HR 70 | Ht 65.0 in | Wt 180.8 lb

## 2012-11-18 DIAGNOSIS — K922 Gastrointestinal hemorrhage, unspecified: Secondary | ICD-10-CM

## 2012-11-18 NOTE — Progress Notes (Signed)
11/18/2012 [name] [id number] 1922/10/10   History of Present Illness:   Patient is an 78 year old female known to Dr. Leone Payor. She has a history of lower GI bleeding on Coumadin in 2012. While off Coumadin because of the bleed patient developed a pulmonary embolism. From a gastrointestinal standpoint, patient did okay until she was rehospitalized 11/04/12 to 11/07/12 with recurrent lower GI bleeding in the setting of Coumadin. INR on admission 2.14. Patient continued to pass blood, she ultimately underwent a flexible sigmoidoscopy with control of bleeding by Dr. Christella Hartigan. Sigmoidoscopy findings included several small left-sided diverticulum, 3-4 small sessile polyps which were not removed. There was blood throughout the colon, most significantly in the right colon. In the hepatic flexure there were 2 adjacent pinpoints of active oozing, felt to be the same location as 2012 bleed. Endoclips were placed at the site. For continued oozing epinephrine was injected. The site was labeled with [place] ink. Patient did not require a blood transfusion this last admission. Hemoglobin on admission was 13, it was 12.9 at time of discharge. She has not had any further bleeding since hospital discharge. No abdominal pain. No gastrointestinal complaints. Bowel movements are normal. Her only complaint is that of shortness of breath which she describes as a chronic problem.  Current Medications, Allergies, Past Medical History, Past Surgical History, Family History and Social History were reviewed in [employer] record.   Physical Exam: General: Pleasant ,well developed , white female in no acute distress Head: Normocephalic and atraumatic Eyes:  sclerae anicteric, conjunctiva pink  Ears: Normal auditory acuity Lungs: Clear throughout to auscultation Heart: Regular rate and rhythm Abdomen: Soft, non tender and non distended. No masses, no hepatomegaly. Normal bowel sounds Musculoskeletal:  Symmetrical with no gross deformities  Extremities: Trace bilateral pedal edema  Neurological: Alert oriented x 4, grossly nonfocal Psychological:  Alert and cooperative. Normal mood and affect  Assessment and Recommendations:  Recurrent lower GI bleed on Coumadin 11/04/12, status post flexible sigmoidoscopy with Endo clip placement and epi injection.  Minimal drop in hemoglobin from 13.6 to 12.9. No further bleeding. Unfortunately, it sounds like patient will be on Coumadin indefinitely secondary to history of PEs. She knows to call our office or go to the emergency department for any recurrent gastrointestinal bleeding. Patient inquires about need to continue PPI. Patient was started on a PPI in 2012 at which time she was taking aspirin. She is no longer taking aspirin or any other gastric irritants. Patient denies any history of GERD. I think it is safe to discontinue the Prevacid. I did ask her to wean off the PPI taking one every other day for the next week and then discontinuing the medication altogether.

## 2012-11-18 NOTE — Patient Instructions (Addendum)
If any bleeding call us immediately  At [phone], otherwise follow up with [place] as needed for any gastro issues that arise.  Decrease your Prevacid as follows:  Take one every other day for a week and then stop it.  Thank you for choosing [place] today.

## 2012-11-19 ENCOUNTER — Encounter: Payer: Self-pay | Admitting: Nurse Practitioner

## 2012-11-20 NOTE — Progress Notes (Signed)
Agree with Ms. Guenther's assessment and plan. Reeve Mallo E. Brisha Mccabe, MD, FACG   

## 2012-12-20 ENCOUNTER — Other Ambulatory Visit: Payer: Self-pay | Admitting: Internal Medicine

## 2012-12-20 NOTE — Telephone Encounter (Signed)
Please advise if ok to refill, thank you Gunnar Fusi.

## 2012-12-20 NOTE — Telephone Encounter (Signed)
Spoke with patient who is reporting back pain in her rib area comes and goes, onset this week.  Also reporting belching.  Per Willette Cluster NP we have called in rx for vicodin 5/325 #20 with 0 refills to take one every 4-6 hrs as needed for pain so she doesn't suffer over the weekend.  She was cautioned against driving on this medicine and advised to contact her PCP , Dr. Renard Matter on Monday to be seen for this to see if muscular.

## 2012-12-24 ENCOUNTER — Ambulatory Visit (INDEPENDENT_AMBULATORY_CARE_PROVIDER_SITE_OTHER): Payer: 59 | Admitting: Internal Medicine

## 2012-12-24 DIAGNOSIS — E538 Deficiency of other specified B group vitamins: Secondary | ICD-10-CM

## 2012-12-24 MED ORDER — CYANOCOBALAMIN 1000 MCG/ML IJ SOLN
1000.0000 ug | INTRAMUSCULAR | Status: AC
  Administered 2012-12-24: 1000 ug via INTRAMUSCULAR

## 2013-02-07 ENCOUNTER — Ambulatory Visit (INDEPENDENT_AMBULATORY_CARE_PROVIDER_SITE_OTHER): Payer: Medicare Other | Admitting: Physician Assistant

## 2013-02-07 ENCOUNTER — Telehealth: Payer: Self-pay | Admitting: Nurse Practitioner

## 2013-02-07 ENCOUNTER — Other Ambulatory Visit (INDEPENDENT_AMBULATORY_CARE_PROVIDER_SITE_OTHER): Payer: Medicare Other

## 2013-02-07 ENCOUNTER — Other Ambulatory Visit: Payer: Self-pay | Admitting: *Deleted

## 2013-02-07 ENCOUNTER — Encounter: Payer: Self-pay | Admitting: Physician Assistant

## 2013-02-07 VITALS — BP 102/60 | HR 70 | Ht 66.0 in | Wt 184.0 lb

## 2013-02-07 DIAGNOSIS — K922 Gastrointestinal hemorrhage, unspecified: Secondary | ICD-10-CM

## 2013-02-07 DIAGNOSIS — Z7901 Long term (current) use of anticoagulants: Secondary | ICD-10-CM

## 2013-02-07 LAB — CBC WITH DIFFERENTIAL/PLATELET
Eosinophils Relative: 1.5 % (ref 0.0–5.0)
HCT: 35.1 % — ABNORMAL LOW (ref 36.0–46.0)
Lymphs Abs: 1 10*3/uL (ref 0.7–4.0)
MCV: 87.1 fl (ref 78.0–100.0)
Monocytes Absolute: 0.5 10*3/uL (ref 0.1–1.0)
Neutro Abs: 2.8 10*3/uL (ref 1.4–7.7)
Platelets: 250 10*3/uL (ref 150.0–400.0)
WBC: 4.4 10*3/uL — ABNORMAL LOW (ref 4.5–10.5)

## 2013-02-07 LAB — PROTIME-INR: Prothrombin Time: 28 s — ABNORMAL HIGH (ref 10.2–12.4)

## 2013-02-07 MED ORDER — MOVIPREP 100 G PO SOLR: 1.0000 | Freq: Once | ORAL | Status: AC

## 2013-02-07 NOTE — Progress Notes (Signed)
Subjective:    Patient ID: [name], female    DOB: 1922/06/24, 79 y.o.   MRN: [id number]  HPI [name] is very nice 79 year old white female known to Dr. Christella Hartigan and Dr. Leone Payor. She has history of recurrent GI bleeding and was last hospitalized in November of 2013 with recurrent bleeding in the setting of chronic Coumadin. She underwent colonoscopy at that time per Dr. Christella Hartigan with finding of several small left-sided diverticuli, 3-4 small sessile polyps not removed and evidence of blood in the right colon. There was an area in the hepatic flexure were 2 adjacent pinpoint of active bleeding very This was felt to be the same source of her bleeding in 2012. This area was endoclipped and injected with epinephrine. This was then followed by tattooing with [place] ink. She has done well since that time without any evidence of recurrent bleeding. She did not require transfusions during that admission and her hemoglobin was 12.9 at the time of discharge,in November of 2013. She says she has been doing well, she has had regular checks of her INR through [facility] cardiology but has not had a repeat CBC. She says her last INR was in the 2-1/2 range. She had onset about a week and a half ago with very dark black bowel movements and says she has been seeing black stool with every bowel movement since. Over the past couple of days the stool has become more brown but with a pinkish tinge to be water in the commode. She has only been having one bowel movement a day, she denies any abdominal pain or discomfort. She says she feels fine in general denies any dizziness or lightheadedness but perhaps may feel a little bit more fatigued than usual . She has continued on her Coumadin. She does have history of chronic atrial fibrillation, and unfortunately he had bilateral pulmonary emboli in 2012 while she was off of Coumadin after presenting with GI bleeding.    Review of Systems  Constitutional: Negative.   HENT:  Negative.   Respiratory: Negative.   Cardiovascular: Negative.   Gastrointestinal: Positive for blood in stool.  Endocrine: Negative.   Genitourinary: Negative.   Allergic/Immunologic: Negative.   Neurological: Negative.   Psychiatric/Behavioral: Negative.    Outpatient Prescriptions Prior to Visit  Medication Sig Dispense Refill  . Calcium 1200-1000 MG-UNIT CHEW Take 1 by mouth once daily       . cholecalciferol (VITAMIN D) 1000 UNITS tablet Take 1,000 Units by mouth daily.      . cyanocobalamin (,VITAMIN B-12,) 1000 MCG/ML injection Inject 1,000 mcg into the muscle every 3 (three) months.      . cycloSPORINE (RESTASIS) 0.05 % ophthalmic emulsion Place 1 drop into both eyes 2 (two) times daily.        . digoxin (LANOXIN) 0.125 MG tablet Take 125 mcg by mouth daily.        . furosemide (LASIX) 20 MG tablet Take 20 mg by mouth daily.       Marland Kitchen HYDROcodone-acetaminophen (VICODIN) 5-500 MG per tablet TAKE 1 TABLET BY MOUTH EVERY 4 TO 6 HOURS AS NEEDED FOR PAIN  20 tablet  0  . lansoprazole (PREVACID) 30 MG capsule TAKE 1 TABLET 30 MINUTES BEFORE BREAKFAST  30 capsule  11  . Lutein 20 MG TABS Take 1 tablet by mouth daily.        . nebivolol (BYSTOLIC) 5 MG tablet Take 2.5 mg by mouth daily.        . Pancrelipase, Lip-Prot-Amyl, (  CREON) 24000 UNITS CPEP Take by mouth. Take 1 tablet by mouth three times a day ac meals and snacks       . warfarin (COUMADIN) 2 MG tablet Take 2-3 mg by mouth daily. Takes 2 mg on Sunday, Tuesday, Wednesday, Thursday and Saturday. Takes 3mg  on Monday and Friday       Facility-Administered Medications Prior to Visit  Medication Dose Route Frequency Provider Last Rate Last Dose  . cyanocobalamin ((VITAMIN B-12)) injection 1,000 mcg  1,000 mcg Intramuscular Q30 days Iva Boop, MD   1,000 mcg at 12/24/12 1350   Allergies  Allergen Reactions  . Meperidine Hcl     REACTION: nausea: VOMIT  . Morphine Sulfate     REACTION: nausea: "VOMIT"   Patient Active Problem  List  Diagnosis  . VITAMIN B12 DEFICIENCY  . ATRIAL FIBRILLATION, CHRONIC  . Irritable bowel syndrome  . PANCREATIC INSUFFICIENCY  . Hemorrhage of gastrointestinal tract, Dieaulafoy's lesion hepatic flexure  . Lower GI bleed   History  Substance Use Topics  . Smoking status: Never Smoker   . Smokeless tobacco: Never Used  . Alcohol Use: Yes     Comment: glass of wine occasionaly   family history includes Heart disease in her mother.  There is no history of Anesthesia problems, and Malignant hyperthermia, and Colon cancer, .     Objective:   Physical Exam well-developed elderly white female in no acute distress, pleasant accompanied by her daughter blood pressure 102/60 pulse 70 height 5 foot 6 weight 184. HEENT; nontraumatic normocephalic EOMI PERRLA sclera anicteric conjunctiva are pink, Supple no JVD, Cardiovascular; regular rate and rhythm with S1-S2 no murmur or gallop, Pulmonary; clear bilaterally, Abdomen; soft nontender nondistended, bowel sounds active no palpable mass or hepatosplenomegaly, Rectal; exam very dark brown stool strongly Hemoccult positive no gross blood noted, Extremities; no clubbing cyanosis or edema skin warm and dry, Psych; mood and affect normal and appropriate.        Assessment & Plan:  #39 79 year old female with recurrent low-grade bleeding with melena x1-1/2 weeks in the setting of chronic anticoagulation with Coumadin and prior history of lower GI bleed x2 secondary to probable hepatic flexure Dieulafoy-type lesion. Patient underwent colonoscopy with Endo Clip name epi injection and tattooing of this area in November of 2013.  #2 history of chronic atrial fibrillation #3 history of bilateral pulmonary emboli 2012 #4 history of IBS  Plan; will check stat CBC today and pro time INR Schedule for repeat colonoscopy with Endo Clip pain and injection therapy as indicated if AVMs or dual for lesion found at the prior site in the hepatic flexure. This will  be set up with Dr. Christella Hartigan, as patient specifically requests Dr. Christella Hartigan. We will schedule at the hospital with propofol. Procedure discussed in detail with patient and her daughter. She will need to hold her Coumadin for 3-4 days prior to the procedure, and also repeat hemoglobin early next week. Patient is advised that should her bleeding increased over the next few days that she should present to the emergency room for admission

## 2013-02-07 NOTE — Telephone Encounter (Signed)
Spoke with patient and Maple Hudson. Scheduled OV with Mike Gip, PA today at 11:15 AM.

## 2013-02-07 NOTE — Patient Instructions (Addendum)
Please go to the basement level to have your labs drawn.   You have been scheduled for a colonoscopy with propofol. Please follow written instructions given to you at your visit today.  Please pick up your prep kit at the pharmacy within the next 1-3 days. If you use inhalers (even only as needed) or a CPAP machine, please bring them with you on the day of your procedure.

## 2013-02-07 NOTE — Progress Notes (Signed)
I agree with the plan outlined in this note 

## 2013-02-10 ENCOUNTER — Other Ambulatory Visit (INDEPENDENT_AMBULATORY_CARE_PROVIDER_SITE_OTHER): Payer: Medicare Other

## 2013-02-10 ENCOUNTER — Other Ambulatory Visit: Payer: Self-pay

## 2013-02-10 ENCOUNTER — Encounter (HOSPITAL_COMMUNITY): Payer: Self-pay | Admitting: *Deleted

## 2013-02-10 LAB — CBC WITH DIFFERENTIAL/PLATELET
Basophils Absolute: 0 10*3/uL (ref 0.0–0.1)
Eosinophils Absolute: 0.1 10*3/uL (ref 0.0–0.7)
HCT: 33 % — ABNORMAL LOW (ref 36.0–46.0)
Hemoglobin: 10.9 g/dL — ABNORMAL LOW (ref 12.0–15.0)
Lymphocytes Relative: 25.5 % (ref 12.0–46.0)
Lymphs Abs: 0.9 10*3/uL (ref 0.7–4.0)
MCHC: 33 g/dL (ref 30.0–36.0)
MCV: 86.9 fl (ref 78.0–100.0)
Monocytes Absolute: 0.4 10*3/uL (ref 0.1–1.0)
Neutro Abs: 2.2 10*3/uL (ref 1.4–7.7)
RDW: 14.7 % — ABNORMAL HIGH (ref 11.5–14.6)

## 2013-02-10 NOTE — Pre-Procedure Instructions (Signed)
Your procedure is scheduled on:Wednesday, February 12, 2013 Report to [facility] Admitting [id number] Call this number if you have problems morning of your procedure:[phone]  Follow all bowel prep instructions per your doctor's orders.  Do not eat or drink anything after midnight the night before your procedure. You may brush your teeth, rinse out your mouth, but no water, no food, no chewing gum, no mints, no candies, no chewing tobacco.     Take these medicines the morning of your procedure with A SIP OF WATER:Hydrocodone if needed Stopped Coumadin Friday, 02-07-2013  Please make arrangements for a responsible person to drive you home after the procedure. You cannot go home by cab/taxi. We recommend you have someone with you at home the first 24 hours after your procedure. Driver for procedure is daughter Carmelina Peal [phone]  LEAVE ALL VALUABLES, JEWELRY, BILLFOLD AT HOME.  NO DENTURES, CONTACT LENSES ALLOWED IN THE ENDOSCOPY ROOM.   YOU MAY WEAR DEODORANT, PLEASE REMOVE ALL JEWELRY, WATCHES RINGS, BODY PIERCINGS AND LEAVE AT HOME.   WOMEN: NO MAKE-UP, LOTIONS PERFUMES

## 2013-02-12 ENCOUNTER — Observation Stay (HOSPITAL_COMMUNITY)
Admission: RE | Admit: 2013-02-12 | Discharge: 2013-02-13 | Disposition: A | Discharge status: Home / Self Care | Payer: Medicare Other | Source: Ambulatory Visit | Attending: Gastroenterology | Admitting: Gastroenterology

## 2013-02-12 ENCOUNTER — Encounter (HOSPITAL_COMMUNITY): Payer: Self-pay

## 2013-02-12 ENCOUNTER — Encounter (HOSPITAL_COMMUNITY)
Admission: RE | Disposition: A | Discharge status: Home / Self Care | Payer: Self-pay | Source: Ambulatory Visit | Attending: Gastroenterology

## 2013-02-12 DIAGNOSIS — I4891 Unspecified atrial fibrillation: Secondary | ICD-10-CM | POA: Insufficient documentation

## 2013-02-12 DIAGNOSIS — K922 Gastrointestinal hemorrhage, unspecified: Secondary | ICD-10-CM

## 2013-02-12 DIAGNOSIS — K5521 Angiodysplasia of colon with hemorrhage: Principal | ICD-10-CM | POA: Insufficient documentation

## 2013-02-12 DIAGNOSIS — Z01812 Encounter for preprocedural laboratory examination: Secondary | ICD-10-CM | POA: Insufficient documentation

## 2013-02-12 DIAGNOSIS — D62 Acute posthemorrhagic anemia: Secondary | ICD-10-CM | POA: Insufficient documentation

## 2013-02-12 HISTORY — PX: COLONOSCOPY: SHX5424

## 2013-02-12 HISTORY — PX: HOT HEMOSTASIS: SHX5433

## 2013-02-12 LAB — CBC
HCT: 30.3 % — ABNORMAL LOW (ref 36.0–46.0)
Hemoglobin: 9.4 g/dL — ABNORMAL LOW (ref 12.0–15.0)
MCH: 27.8 pg (ref 26.0–34.0)
MCHC: 31 g/dL (ref 30.0–36.0)
MCV: 89.6 fL (ref 78.0–100.0)
Platelets: 225 10*3/uL (ref 150–400)
RBC: 3.38 MIL/uL — ABNORMAL LOW (ref 3.87–5.11)
RDW: 14.3 % (ref 11.5–15.5)
WBC: 3.6 10*3/uL — ABNORMAL LOW (ref 4.0–10.5)

## 2013-02-12 SURGERY — COLONOSCOPY
Anesthesia: Moderate Sedation

## 2013-02-12 MED ORDER — NEBIVOLOL HCL 2.5 MG PO TABS
2.5000 mg | ORAL_TABLET | Freq: Every day | ORAL | Status: DC
  Administered 2013-02-12 – 2013-02-13 (×2): 2.5 mg via ORAL
  Filled 2013-02-12 (×3): qty 1

## 2013-02-12 MED ORDER — SODIUM CHLORIDE 0.9 % IV SOLN: INTRAVENOUS | Status: DC

## 2013-02-12 MED ORDER — PANCRELIPASE (LIP-PROT-AMYL) 12000-38000 UNITS PO CPEP
2.0000 | ORAL_CAPSULE | Freq: Three times a day (TID) | ORAL | Status: DC
  Administered 2013-02-12 – 2013-02-13 (×3): 2 via ORAL
  Filled 2013-02-12 (×6): qty 2

## 2013-02-12 MED ORDER — CYCLOSPORINE 0.05 % OP EMUL
1.0000 [drp] | Freq: Two times a day (BID) | OPHTHALMIC | Status: DC
  Administered 2013-02-13: 1 [drp] via OPHTHALMIC
  Filled 2013-02-12 (×3): qty 1

## 2013-02-12 MED ORDER — DIGOXIN 125 MCG PO TABS
125.0000 ug | ORAL_TABLET | Freq: Every day | ORAL | Status: DC
  Administered 2013-02-12: 125 ug via ORAL
  Filled 2013-02-12 (×3): qty 1

## 2013-02-12 MED ORDER — ONDANSETRON HCL 4 MG/2ML IJ SOLN: 4.0000 mg | Freq: Four times a day (QID) | INTRAMUSCULAR | Status: DC | PRN

## 2013-02-12 MED ORDER — FUROSEMIDE 20 MG PO TABS
20.0000 mg | ORAL_TABLET | Freq: Every day | ORAL | Status: DC
  Administered 2013-02-12 – 2013-02-13 (×2): 20 mg via ORAL
  Filled 2013-02-12 (×3): qty 1

## 2013-02-12 MED ORDER — DIPHENHYDRAMINE HCL 50 MG/ML IJ SOLN
INTRAMUSCULAR | Status: AC
  Filled 2013-02-12: qty 1

## 2013-02-12 MED ORDER — FENTANYL CITRATE 0.05 MG/ML IJ SOLN
INTRAMUSCULAR | Status: DC | PRN
  Administered 2013-02-12 (×3): 25 ug via INTRAVENOUS

## 2013-02-12 MED ORDER — ONDANSETRON HCL 4 MG PO TABS
4.0000 mg | ORAL_TABLET | Freq: Four times a day (QID) | ORAL | Status: DC | PRN
  Filled 2013-02-12: qty 1

## 2013-02-12 MED ORDER — SODIUM CHLORIDE 0.9 % IV SOLN
INTRAVENOUS | Status: DC
  Administered 2013-02-12: 500 mL via INTRAVENOUS

## 2013-02-12 MED ORDER — HYDROCODONE-ACETAMINOPHEN 5-325 MG PO TABS: 1.0000 | ORAL_TABLET | Freq: Four times a day (QID) | ORAL | Status: DC | PRN

## 2013-02-12 MED ORDER — MIDAZOLAM HCL 5 MG/5ML IJ SOLN
INTRAMUSCULAR | Status: DC | PRN
  Administered 2013-02-12 (×4): 2 mg via INTRAVENOUS

## 2013-02-12 MED ORDER — FENTANYL CITRATE 0.05 MG/ML IJ SOLN
INTRAMUSCULAR | Status: AC
  Filled 2013-02-12: qty 2

## 2013-02-12 MED ORDER — SODIUM CHLORIDE 0.9 % IV SOLN
Freq: Once | INTRAVENOUS | Status: AC
  Administered 2013-02-12: 500 mL via INTRAVENOUS

## 2013-02-12 MED ORDER — MIDAZOLAM HCL 10 MG/2ML IJ SOLN
INTRAMUSCULAR | Status: AC
  Filled 2013-02-12: qty 2

## 2013-02-12 MED ORDER — ACETAMINOPHEN 325 MG PO TABS
650.0000 mg | ORAL_TABLET | ORAL | Status: DC | PRN
  Filled 2013-02-12: qty 2

## 2013-02-12 NOTE — Op Note (Signed)
[facility] [address] [place], [address]   COLONOSCOPY PROCEDURE REPORT  PATIENT: [name], [name]  MR#: [id number] BIRTHDATE: 1922-12-12 , 89  yrs. old GENDER: Female ENDOSCOPIST: Rachael Fee, MD PROCEDURE DATE:  02/12/2013 PROCEDURE:   Colonoscopy with control of bleeding ASA CLASS:   Class III INDICATIONS:recurrent Gi bleeding from right colon (treated 2012 and also 2013 November), AVM sites?Marland Kitchen MEDICATIONS: Fentanyl 75 mcg IV and Versed 8 mg IV  DESCRIPTION OF PROCEDURE:   After the risks benefits and alternatives of the procedure were thoroughly explained, informed consent was obtained.  A digital rectal exam revealed no abnormalities of the rectum.   The Pentax Colonoscope [id number] endoscope was introduced through the anus and advanced to the cecum, which was identified by both the appendix and ileocecal valve. No adverse events experienced.   The quality of the prep was good, using MoviPrep  The instrument was then slowly withdrawn as the colon was fully examined.  COLON FINDINGS: There was old and recent (fresh red blood in colon). There were several small diverticulum in left colon.  There were several small scattered polyps throughout colon.  The Nov 2013 site of previous bleeding was clearly located at hepatic flexure (previous clips still in place) and this site was not bleeding. There was a small AVM about 5cm proximal to the clips with active oozing and this was treated with application of APC with immediate hemostasis.  Retroflexed views revealed no abnormalities. The time to cecum=5 minutes 00 seconds.  Withdrawal time=10 minutes 00 seconds.  The scope was withdrawn and the procedure completed. COMPLICATIONS: There were no complications.  ENDOSCOPIC IMPRESSION: There was old and recent (fresh red blood in colon).  There were several small diverticulum in left colon.  There were several small scattered polyps throughout colon.  The Nov  2013 site of previous bleeding was clearly located at hepatic flexure (previous clips still in place) and this site was not bleeding.  There was a small AVM about 5cm proximal to the clips with active oozing and this was treated with application of APC with immediate hemostasis.  The examination was otherwise normal.  RECOMMENDATIONS: CBC now and will admit for observation overnight, possible transfusion.   eSigned:  Rachael Fee, MD 02/12/2013 12:53 PM

## 2013-02-12 NOTE — OR Nursing (Signed)
CBC drawn from left anticubital without complications and transported to lab per order.

## 2013-02-12 NOTE — H&P (View-Only) (Signed)
Subjective:    Patient ID: [name], female    DOB: 08/07/1922, 79 y.o.   MRN: [id number]  HPI Meira is very nice 79-year-old white female known to Dr. Jacobs and Dr. Gessner. She has history of recurrent GI bleeding and was last hospitalized in November of 2013 with recurrent bleeding in the setting of chronic Coumadin. She underwent colonoscopy at that time per Dr. Jacobs with finding of several small left-sided diverticuli, 3-4 small sessile polyps not removed and evidence of blood in the right colon. There was an area in the hepatic flexure were 2 adjacent pinpoint of active bleeding very This was felt to be the same source of her bleeding in 2012. This area was endoclipped and injected with epinephrine. This was then followed by tattooing with [place] ink. She has done well since that time without any evidence of recurrent bleeding. She did not require transfusions during that admission and her hemoglobin was 12.9 at the time of discharge,in November of 2013. She says she has been doing well, she has had regular checks of her INR through [facility] but has not had a repeat CBC. She says her last INR was in the 2-1/2 range. She had onset about a week and a half ago with very dark black bowel movements and says she has been seeing black stool with every bowel movement since. Over the past couple of days the stool has become more brown but with a pinkish tinge to be water in the commode. She has only been having one bowel movement a day, she denies any abdominal pain or discomfort. She says she feels fine in general denies any dizziness or lightheadedness but perhaps may feel a little bit more fatigued than usual . She has continued on her Coumadin. She does have history of chronic atrial fibrillation, and unfortunately he had bilateral pulmonary emboli in 2012 while she was off of Coumadin after presenting with GI bleeding.    Review of Systems  Constitutional: Negative.   HENT:  Negative.   Respiratory: Negative.   Cardiovascular: Negative.   Gastrointestinal: Positive for blood in stool.  Endocrine: Negative.   Genitourinary: Negative.   Allergic/Immunologic: Negative.   Neurological: Negative.   Psychiatric/Behavioral: Negative.    Outpatient Prescriptions Prior to Visit  Medication Sig Dispense Refill  . Calcium 1200-1000 MG-UNIT CHEW Take 1 by mouth once daily       . cholecalciferol (VITAMIN D) 1000 UNITS tablet Take 1,000 Units by mouth daily.      . cyanocobalamin (,VITAMIN B-12,) 1000 MCG/ML injection Inject 1,000 mcg into the muscle every 3 (three) months.      . cycloSPORINE (RESTASIS) 0.05 % ophthalmic emulsion Place 1 drop into both eyes 2 (two) times daily.        . digoxin (LANOXIN) 0.125 MG tablet Take 125 mcg by mouth daily.        . furosemide (LASIX) 20 MG tablet Take 20 mg by mouth daily.       . HYDROcodone-acetaminophen (VICODIN) 5-500 MG per tablet TAKE 1 TABLET BY MOUTH EVERY 4 TO 6 HOURS AS NEEDED FOR PAIN  20 tablet  0  . lansoprazole (PREVACID) 30 MG capsule TAKE 1 TABLET 30 MINUTES BEFORE BREAKFAST  30 capsule  11  . Lutein 20 MG TABS Take 1 tablet by mouth daily.        . nebivolol (BYSTOLIC) 5 MG tablet Take 2.5 mg by mouth daily.        . Pancrelipase, Lip-Prot-Amyl, (  CREON) 24000 UNITS CPEP Take by mouth. Take 1 tablet by mouth three times a day ac meals and snacks       . warfarin (COUMADIN) 2 MG tablet Take 2-3 mg by mouth daily. Takes 2 mg on Sunday, Tuesday, Wednesday, Thursday and Saturday. Takes 3mg on Monday and Friday       Facility-Administered Medications Prior to Visit  Medication Dose Route Frequency Provider Last Rate Last Dose  . cyanocobalamin ((VITAMIN B-12)) injection 1,000 mcg  1,000 mcg Intramuscular Q30 days Carl E Gessner, MD   1,000 mcg at 12/24/12 1350   Allergies  Allergen Reactions  . Meperidine Hcl     REACTION: nausea: VOMIT  . Morphine Sulfate     REACTION: nausea: "VOMIT"   Patient Active Problem  List  Diagnosis  . VITAMIN B12 DEFICIENCY  . ATRIAL FIBRILLATION, CHRONIC  . Irritable bowel syndrome  . PANCREATIC INSUFFICIENCY  . Hemorrhage of gastrointestinal tract, Dieaulafoy's lesion hepatic flexure  . Lower GI bleed   History  Substance Use Topics  . Smoking status: Never Smoker   . Smokeless tobacco: Never Used  . Alcohol Use: Yes     Comment: glass of wine occasionaly   family history includes Heart disease in her mother.  There is no history of Anesthesia problems, and Malignant hyperthermia, and Colon cancer, .     Objective:   Physical Exam well-developed elderly white female in no acute distress, pleasant accompanied by her daughter blood pressure 102/60 pulse 70 height 5 foot 6 weight 184. HEENT; nontraumatic normocephalic EOMI PERRLA sclera anicteric conjunctiva are pink, Supple no JVD, Cardiovascular; regular rate and rhythm with S1-S2 no murmur or gallop, Pulmonary; clear bilaterally, Abdomen; soft nontender nondistended, bowel sounds active no palpable mass or hepatosplenomegaly, Rectal; exam very dark brown stool strongly Hemoccult positive no gross blood noted, Extremities; no clubbing cyanosis or edema skin warm and dry, Psych; mood and affect normal and appropriate.        Assessment & Plan:  #1 79-year-old female with recurrent low-grade bleeding with melena x1-1/2 weeks in the setting of chronic anticoagulation with Coumadin and prior history of lower GI bleed x2 secondary to probable hepatic flexure Dieulafoy-type lesion. Patient underwent colonoscopy with Endo Clip name epi injection and tattooing of this area in November of 2013.  #2 history of chronic atrial fibrillation #3 history of bilateral pulmonary emboli 2012 #4 history of IBS  Plan; will check stat CBC today and pro time INR Schedule for repeat colonoscopy with Endo Clip pain and injection therapy as indicated if AVMs or dual for lesion found at the prior site in the hepatic flexure. This will  be set up with Dr. Jacobs, as patient specifically requests Dr. Jacobs. We will schedule at the hospital with propofol. Procedure discussed in detail with patient and her daughter. She will need to hold her Coumadin for 3-4 days prior to the procedure, and also repeat hemoglobin early next week. Patient is advised that should her bleeding increased over the next few days that she should present to the emergency room for admission 

## 2013-02-12 NOTE — H&P (Signed)
Primary Care Physician:  [name], MD Primary Gastroenterologist:  Dr. Christella Hartigan  CHIEF COMPLAINT:  GIB  HPI: [name] is a 79 y.o. female known to Dr. Christella Hartigan and Dr. Leone Payor. She has history of recurrent GI bleeding and was last hospitalized in November of 2013 with recurrent bleeding in the setting of chronic Coumadin. She underwent colonoscopy at that time per Dr. Christella Hartigan with finding of several small left-sided diverticuli, 3-4 small sessile polyps not removed and evidence of blood in the right colon. There was an area in the hepatic flexure were 2 adjacent pinpoint of active bleeding very This was felt to be the same source of her bleeding in 2012. This area was endoclipped and injected with epinephrine. This was then followed by tattooing with [place] ink.   She had been doing well since that time without any evidence of recurrent bleeding. She did not require transfusions during that admission and her hemoglobin was 12.9 at the time of discharge,in November of 2013.   Then, a couple of weeks ago she had onset of very dark black bowel movements and says she has been seeing black stool with every bowel movement since. Over the next several of days the stool had become more brown but with a pinkish tinge to be water in the commode. She had only been having one bowel movement a day, she denied any abdominal pain or discomfort.  At this time she was seen in our office (last week) and scheduled for a repeat colonoscopy with Dr. Christella Hartigan.    In the interim she started bleeding again, but proceeded to prep for her colonoscopy, which was performed today.  Hgb two days ago was 10.9 grams and recheck today was 9.4 grams.  Colonoscopy revealed an area that was bleeding just proximal to the last site that was clipped in 2013.  APC was applied with immediate hemostasis.  Due to her age and her history, it was determined that she should be admitted for observation and monitoring of her Hgb for a short  time.  She does have history of chronic atrial fibrillation, and unfortunately he had bilateral pulmonary emboli in 2012 while she was off of Coumadin after presenting with GI bleeding.    Past Medical History  Diagnosis Date  . Abnormal liver function test   . IBS (irritable bowel syndrome)   . Pancreatic insufficiency     ? mass on imaging notborne out on follow-up  . Gastric ulcer   . Erosive gastritis 7/10  . Vitamin B12 deficiency   . Iron deficiency anemia   . Compression fracture of C-spine   . Pulmonary embolism     bilateral   . Diverticulosis   . Internal hemorrhoids   . Anemia     post hemorragic  . GI bleed   . Fibromyalgia   . Atrial fibrillation, chronic   . Osteoarthritis     back and lower extremities, neck    Past Surgical History  Procedure Laterality Date  . Tubal ligation    . Back surgery    . Left knee surgery    . Hernia repair    . Carpal tunnel release      right  . Wrist surgery      right plate  . Bladder surgery      tact  . Ovarian cyst removal    . Esophagogastroduodenoscopy  06/2009    erosive gastritis, small ulcer, hiatal hernia (negative biopsies)  . Colonoscopy  08/2009  diverticulosis, internal hemorrhoids  . Upper gastrointestinal endoscopy  05/30/11    normal  . Colonoscopy  06/02/11    Diverticulosis  . Capsule endoscopy  06/04/11    normal  . Esophagogastroduodenoscopy  11/29/2011    Procedure: ESOPHAGOGASTRODUODENOSCOPY (EGD);  Surgeon: Louis Meckel, MD;  Location: [facility];  Service: Endoscopy;  Laterality: N/A;  . Givens capsule study  11/29/2011    Procedure: GIVENS CAPSULE STUDY;  Surgeon: Louis Meckel, MD;  Location: [facility];  Service: Endoscopy;  Laterality: N/A;  . Enteroscopy  12/02/2011    Procedure: ENTEROSCOPY;  Surgeon: Louis Meckel, MD;  Location: [facility];  Service: Endoscopy;  Laterality: N/A;  . Colonoscopy  12/03/2011    Procedure: COLONOSCOPY;  Surgeon: Louis Meckel, MD;   Location: [facility];  Service: Endoscopy;  Laterality: N/A;  . Colonoscopy  12/04/2011    Procedure: COLONOSCOPY;  Surgeon: Rob Bunting, MD;  Location: [facility];  Service: Endoscopy;  Laterality: N/A;  . Colonoscopy  11/06/2012    Procedure: COLONOSCOPY;  Surgeon: Rachael Fee, MD;  Location: [facility];  Service: Endoscopy;  Laterality: N/A;    Prior to Admission medications   Medication Sig Start Date End Date Taking? Authorizing Provider  cholecalciferol (VITAMIN D) 1000 UNITS tablet Take 1,000 Units by mouth daily.   Yes Historical Provider, MD  cyanocobalamin (,VITAMIN B-12,) 1000 MCG/ML injection Inject 1,000 mcg into the muscle every 3 (three) months.   Yes Historical Provider, MD  cycloSPORINE (RESTASIS) 0.05 % ophthalmic emulsion Place 1 drop into both eyes 2 (two) times daily.     Yes Historical Provider, MD  digoxin (LANOXIN) 0.125 MG tablet Take 125 mcg by mouth daily.     Yes Historical Provider, MD  furosemide (LASIX) 20 MG tablet Take 20 mg by mouth daily.    Yes Historical Provider, MD  HYDROcodone-acetaminophen (VICODIN) 5-500 MG per tablet TAKE 1 TABLET BY MOUTH EVERY 4 TO 6 HOURS AS NEEDED FOR PAIN 12/20/12  Yes Meredith Pel, NP  Lutein 20 MG TABS Take 1 tablet by mouth daily.     Yes Historical Provider, MD  nebivolol (BYSTOLIC) 5 MG tablet Take 2.5 mg by mouth daily.    Yes Historical Provider, MD  Pancrelipase, Lip-Prot-Amyl, (CREON) 24000 UNITS CPEP Take by mouth. Take 1 tablet by mouth three times a day ac meals and snacks    Yes Historical Provider, MD    Current Facility-Administered Medications  Medication Dose Route Frequency Provider Last Rate Last Dose  . 0.9 %  sodium chloride infusion   Intravenous Continuous Rachael Fee, MD 20 mL/hr at 02/12/13 1147 500 mL at 02/12/13 1147  . acetaminophen (TYLENOL) tablet 650 mg  650 mg Oral Q4H PRN Princella Pellegrini. Zehr, PA      . cycloSPORINE (RESTASIS) 0.05 % ophthalmic emulsion 1 drop  1 drop Both Eyes BID  Jessica D. Zehr, PA      . digoxin (LANOXIN) tablet 125 mcg  125 mcg Oral Daily Jessica D. Zehr, PA      . furosemide (LASIX) tablet 20 mg  20 mg Oral Daily Jessica D. Zehr, PA      . HYDROcodone-acetaminophen (NORCO/VICODIN) 5-325 MG per tablet 1 tablet  1 tablet Oral Q6H PRN Princella Pellegrini. Zehr, PA      . lipase/protease/amylase (CREON-10/PANCREASE) capsule 2 capsule  2 capsule Oral TID WC Jessica D. Zehr, PA      . nebivolol (BYSTOLIC) tablet 2.5 mg  2.5 mg Oral Daily Shanda Bumps  D. Zehr, PA      . ondansetron (ZOFRAN) injection 4 mg  4 mg Intravenous Q6H PRN Jessica D. Zehr, PA      . ondansetron (ZOFRAN) tablet 4 mg  4 mg Oral Q6H PRN Princella Pellegrini. Zehr, PA        Allergies as of 02/10/2013 - Review Complete 02/07/2013  Allergen Reaction Noted  . Meperidine hcl  06/04/2007  . Morphine sulfate  06/04/2007    Family History  Problem Relation Age of Onset  . Heart disease Mother   . Anesthesia problems Neg Hx   . Malignant hyperthermia Neg Hx   . Colon cancer Neg Hx     History   Social History  . Marital Status: Widowed    Spouse Name: N/A    Number of Children: 3  . Years of Education: N/A   Occupational History  . Retired    Social History Main Topics  . Smoking status: Never Smoker   . Smokeless tobacco: Never Used  . Alcohol Use: Yes     Comment: glass of wine occasionaly  . Drug Use: No  . Sexually Active: No   Other Topics Concern  . Not on file   Social History Narrative   still active and drives, works at [employer]   travels    Review of Systems: Ten point ROS is O/W negative except as mentioned in HPI.  Physical Exam: Vital signs in last 24 hours: Temp:  [97.7 F (36.5 C)] 97.7 F (36.5 C) (02/26 1127) Pulse Rate:  [60-78] 60 (02/26 1240) Resp:  [12-94] 15 (02/26 1440) BP: (67-131)/(32-73) 122/60 mmHg (02/26 1440) SpO2:  [83 %-100 %] 100 % (02/26 1440)   General:   Alert, Well-developed, well-nourished, pleasant and cooperative in  NAD Head:  Normocephalic and atraumatic. Eyes:  Sclera clear, no icterus.  Conjunctiva pink. Ears:  Normal auditory acuity. Mouth:  No deformity or lesions.  Oropharynx pink & moist. Lungs:  Clear throughout to auscultation.   No wheezes, crackles, or rhonchi. No acute distress. Heart:  Regular rate and rhythm; no murmurs, clicks, rubs,  or gallops. Abdomen:  Soft, nontender and nondistended. No masses, hepatosplenomegaly or hernias noted. Normal bowel sounds, without guarding, and without rebound.   Rectal:  Deferred, done at time of colonoscopy. Msk:  Symmetrical without gross deformities. Normal posture. Pulses:  Normal pulses noted. Extremities:  +2 pitting edema in B/L LE's Neurologic:  Alert and  oriented x4;  grossly normal neurologically. Skin:  Intact without significant lesions or rashes. Psych:  Alert and cooperative. Normal mood and affect.  Lab Results:  Recent Labs  02/10/13 1025 02/12/13 1335  WBC 3.6* 3.6*  HGB 10.9* 9.4*  HCT 33.0* 30.3*  PLT 253.0 225   PT/INR  Recent Labs  02/10/13 1025  LABPROT 19.1*  INR 1.8*   Impression / Plan: -Recurrent lower GIB:  Secondary to AVM's in right colon.  S/P colonoscopy today, 2/26, with oozing seen and APC applied with good hemostasis.  -Blood loss anemia:  Hgb 9.4 grams.  Will recheck in AM.  -Atrial fibrillation:  Coumadin on hold.  Will restart Friday as long as bleeding has stopped.  Will restart digoxin.    LOS: 0 days   ZEHR, JESSICA D.  02/12/2013, 3:32 PM

## 2013-02-12 NOTE — Interval H&P Note (Signed)
History and Physical Interval Note:  02/12/2013 11:19 AM  [name]  has presented today for surgery, with the diagnosis of GI bleed [578.9]  The various methods of treatment have been discussed with the patient and family. After consideration of risks, benefits and other options for treatment, the patient has consented to  Procedure(s): COLONOSCOPY (N/A) as a surgical intervention .  The patient's history has been reviewed, patient examined, no change in status, stable for surgery.  I have reviewed the patient's chart and labs.  Questions were answered to the patient's satisfaction.     Rob Bunting

## 2013-02-13 ENCOUNTER — Other Ambulatory Visit: Payer: Self-pay

## 2013-02-13 ENCOUNTER — Encounter (HOSPITAL_COMMUNITY): Payer: Self-pay | Admitting: Gastroenterology

## 2013-02-13 DIAGNOSIS — R609 Edema, unspecified: Secondary | ICD-10-CM

## 2013-02-13 DIAGNOSIS — D649 Anemia, unspecified: Secondary | ICD-10-CM

## 2013-02-13 LAB — CBC
Hemoglobin: 8.9 g/dL — ABNORMAL LOW (ref 12.0–15.0)
MCH: 27.9 pg (ref 26.0–34.0)
MCHC: 31.1 g/dL (ref 30.0–36.0)
RDW: 14.3 % (ref 11.5–15.5)

## 2013-02-13 MED ORDER — SODIUM CHLORIDE 0.9 % IV SOLN
INTRAVENOUS | Status: DC
  Administered 2013-02-13: 08:00:00 via INTRAVENOUS

## 2013-02-13 NOTE — Progress Notes (Signed)
[facility] Progress Note  Subjective:  Complaining of numbness and soreness in right calf.  Yellow colored stools. No blood.  Objective:  Vital signs in last 24 hours: Temp:  [97.3 F (36.3 C)-97.8 F (36.6 C)] 97.8 F (36.6 C) (02/27 5784) Pulse Rate:  [52-78] 56 (02/27 0613) Resp:  [12-94] 18 (02/27 0613) BP: (67-131)/(32-76) 97/59 mmHg (02/27 0613) SpO2:  [83 %-100 %] 94 % (02/27 0613) Weight:  [184 lb (83.462 kg)] 184 lb (83.462 kg) (02/26 1455) Last BM Date: 02/12/13 General:   Alert, Well-developed, in NAD Heart:  Regular rate and rhythm; no murmurs Pulm:  CTAB.  No W/R/R. Abdomen:  Soft, nontender and nondistended. Normal bowel sounds, without guarding, and without rebound.   Extremities:  Without edema.  Tenderness in right calf with positive Homan's sign. Neurologic:  Alert and  oriented x4;  grossly normal neurologically. Psych:  Alert and cooperative. Normal mood and affect.  Intake/Output from previous day: 02/26 0701 - 02/27 0700 In: 1594.3 [P.O.:600; I.V.:994.3] Out: 1550 [Urine:1550] Intake/Output this shift: Total I/O In: 120 [P.O.:120] Out: 250 [Urine:250]  Lab Results:  Recent Labs  02/10/13 1025 02/12/13 1335 02/13/13 0445  WBC 3.6* 3.6* 3.0*  HGB 10.9* 9.4* 8.9*  HCT 33.0* 30.3* 28.6*  PLT 253.0 225 216   PT/INR  Recent Labs  02/10/13 1025  LABPROT 19.1*  INR 1.8*   Assessment / Plan: -Recurrent lower GIB: Secondary to AVM's in right colon. S/P colonoscopy 2/26, with oozing seen and APC applied with good hemostasis.  -Blood loss anemia: Hgb 8.9 grams this AM.  Drop from yesterday is likely dilutional. Will hold on transfusion. -Atrial fibrillation: Coumadin on hold. Will restart Friday as long as bleeding does not recur.  -Right calf pain: Will check venous duplex.    LOS: 1 day   ZEHR, JESSICA D.  02/13/2013, 9:18 AM  Pager number [phone] I have reviewed the above note, examined the patient and agree with plan of  treatment. Her Doppler study is negative. She will not be transfused. May consider SBCE as an outpatient to look for small bowl avm's, which may be source of chronic GIB  [facility] Pager # [phone]

## 2013-02-13 NOTE — Progress Notes (Signed)
VASCULAR LAB PRELIMINARY  PRELIMINARY  PRELIMINARY  PRELIMINARY  Right lower extremity venous duplex completed.    Preliminary report:  Right:  No evidence of DVT, superficial thrombosis, or Baker's cyst.  [name], RVS 02/13/2013, 10:00 AM

## 2013-02-13 NOTE — Care Management Note (Signed)
    Page 1 of 1   02/13/2013     2:56:16 PM   CARE MANAGEMENT NOTE 02/13/2013  Patient:  [name], [name]   Account Number:  [id number]  Date Initiated:  02/13/2013  Documentation initiated by:  [facility]  Subjective/Objective Assessment:   ADMITTED W/GIB.[id number] GIB.     Action/Plan:   FROM HOME ALONE.   Anticipated DC Date:  02/13/2013   Anticipated DC Plan:  HOME/SELF CARE      DC Planning Services  CM consult      Choice offered to / List presented to:             Status of service:  Completed, signed off Medicare Important Message given?   (If response is "NO", the following Medicare IM given date fields will be blank) Date Medicare IM given:   Date Additional Medicare IM given:    Discharge Disposition:  HOME/SELF CARE  Per UR Regulation:  Reviewed for med. necessity/level of care/duration of stay  If discussed at Long Length of Stay Meetings, dates discussed:    Comments:  02/13/13 Estefano Victory RN,BSN NCM [phone] D/C HOME NO ORDERS OR NEEDS.

## 2013-02-13 NOTE — Progress Notes (Addendum)
Telephone order to hold digoxin at this moment due to patient's heart rate by MD Basilio Cairo RN 02-14-2012 10:32am

## 2013-02-13 NOTE — Progress Notes (Signed)
Patient Blood pressure was noted at 84/52 and previously was 95/61. Contacted physician on call dr stark to make aware of situation. He ordered normal saline at 193ml/hr for six hours then 145ml/hr continuously after. Roark Rufo RN

## 2013-02-13 NOTE — Discharge Summary (Signed)
[facility] Discharge Summary  Name: [name] MRN: [id number] DOB: 01-Oct-1922 79 y.o. 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  GI Procedures: Colonoscopy on 2/26 revealed an area that was bleeding just proximal to the last site that was clipped in 2013. APC was applied with immediate hemostasis.   History/Physical Exam:  See Admission H&P  Admission HPI:  Patient was admitted to [facility] hospital on 2/26 following her colonoscopy for observation of bleeding.  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  During her colonoscopy on 2/26 she had an AVM that was oozing blood; it was APC'ed with good, immediate hemostasis.  Unfortunately she had been bleeding for a couple of days and had a 1.5 gram drop in her Hgb in two days, therefore, it was determined that she should be admitted.  She was given normal saline for a short time, but it was discontinued since she was eating and tolerating a heart healthy diet.  She was restarted on her home medications, except coumadin, which is being held until 2/28.  She had yellow stools, then with a small amount of dark stool.  Hgb on the morning of 2/27 was 8.9 grams, likely a slight dilutional drop, therefore, we decided to defer transfusion.    She was running low BP overnight and on call MD was paged and re-ordered IVF's.  This AM her HR was running low so her Digoxin was held.    She was complaining of pain and numbness in her right leg/calf.  Said that this pain was present before she came in but was worsening.  Concern was for DVT since she has a history of PE.  RLE venous duplex was negative.  Since pain was  present prior to colonoscopy and hospital stay, she was asked to address it further with her PCP in follow-up next week.  Discharge Vitals:  BP 117/58  Pulse 57  Temp(Src) 97.7 F (36.5 C) (Oral)  Resp 18  Ht 5\' 6"  (1.676 m)  Wt 184 lb (83.462 kg)  BMI 29.71 kg/m2  SpO2 94%  Discharge Labs:  Results for orders placed during the hospital encounter of 02/12/13 (from the past 24 hour(s))  CBC     Status: Abnormal   Collection Time    02/12/13  1:35 PM      Result Value Range   WBC 3.6 (*) 4.0 - 10.5 K/uL   RBC 3.38 (*) 3.87 - 5.11 MIL/uL   Hemoglobin 9.4 (*) 12.0 - 15.0 g/dL   HCT 11.9 (*) 14.7 - 82.9 %   MCV 89.6  78.0 - 100.0 fL   MCH 27.8  26.0 - 34.0 pg   MCHC 31.0  30.0 - 36.0 g/dL   RDW 56.2  13.0 - 86.5 %   Platelets 225  150 - 400 K/uL  CBC     Status: Abnormal   Collection Time    02/13/13  4:45 AM      Result Value Range   WBC 3.0 (*) 4.0 - 10.5 K/uL   RBC 3.19 (*) 3.87 - 5.11 MIL/uL   Hemoglobin 8.9 (*) 12.0 - 15.0 g/dL   HCT 78.4 (*) 69.6 - 29.5 %   MCV 89.7  78.0 - 100.0 fL   MCH 27.9  26.0 - 34.0 pg   MCHC 31.1  30.0 - 36.0 g/dL   RDW 29.5  62.1 - 30.8 %   Platelets 216  150 - 400 K/uL    Disposition and follow-up:   [name] was discharged from [facility] in stable condition.    Follow-up Appointments: Discharge Orders   Future Orders Complete By Expires     ABDOMINAL PROCEDURE/ANEURYSM REPAIR/AORTO-BIFEMORAL BYPASS:  Call MD for increased abdominal pain; cramping diarrhea; nausea/vomiting  As directed     Activity as tolerated - No restrictions  As directed     Call MD for:  temperature >100.5  As directed     No wound care  As directed     Resume previous diet  As directed        Discharge Medications:   Medication List    STOP taking these medications       warfarin 2 MG tablet  Commonly known as:  COUMADIN      TAKE these medications       cholecalciferol 1000 UNITS tablet  Commonly known as:  VITAMIN D  Take  1,000 Units by mouth daily.     CREON 24000 UNITS Cpep  Generic drug:  Pancrelipase (Lip-Prot-Amyl)  Take by mouth. Take 1 tablet by mouth three times a day ac meals and snacks     cyanocobalamin 1000 MCG/ML injection  Commonly known as:  (VITAMIN B-12)  Inject 1,000 mcg into the muscle every 3 (three) months.     cycloSPORINE 0.05 % ophthalmic emulsion  Commonly known as:  RESTASIS  Place 1 drop into both eyes 2 (two) times daily.     digoxin 0.125 MG tablet  Commonly known as:  LANOXIN  Take 125 mcg by mouth daily.     furosemide 20 MG tablet  Commonly known as:  LASIX  Take 20 mg by mouth daily.     HYDROcodone-acetaminophen 5-500 MG per tablet  Commonly known as:  VICODIN  TAKE 1 TABLET BY MOUTH EVERY 4 TO 6 HOURS AS NEEDED FOR PAIN     Lutein 20 MG Tabs  Take 1 tablet by mouth daily.     nebivolol 5 MG tablet  Commonly known as:  BYSTOLIC  Take 2.5 mg by mouth daily.        SignedCristi Loron, JESSICA D. 02/13/2013, 12:04 PM

## 2013-02-14 ENCOUNTER — Telehealth: Payer: Self-pay | Admitting: Gastroenterology

## 2013-02-14 NOTE — Telephone Encounter (Signed)
Called patient with final report of her RLE duplex.  No clot as previously noted, but it did show an incidentally noted ruptured Baker's cyst extending into the popliteal fossa and proximal calf.  She was informed that this is the most likely cause of her pain.  It is a benign condition that will just improve and resolve with time.  She was told to ice and elevated her calf.  Could also take anti-inflammatories, but with her recent bleeding issues I told her that those should be deferred for now.    She says that her stools look normal in color this morning as well.

## 2013-02-27 ENCOUNTER — Telehealth: Payer: Self-pay

## 2013-02-27 NOTE — Telephone Encounter (Signed)
Pt called and reminded to have labs this week

## 2013-02-27 NOTE — Telephone Encounter (Signed)
Message copied by LEWIS, PATTY L on Thu Feb 27, 2013  8:00 AM ------      [name]      Created: Thu Feb 13, 2013 12:03 PM       Pt to get labs per [place] ------

## 2013-03-05 ENCOUNTER — Other Ambulatory Visit (INDEPENDENT_AMBULATORY_CARE_PROVIDER_SITE_OTHER): Payer: Medicare Other

## 2013-03-05 ENCOUNTER — Ambulatory Visit: Payer: Self-pay | Admitting: Cardiovascular Disease

## 2013-03-05 DIAGNOSIS — D649 Anemia, unspecified: Secondary | ICD-10-CM

## 2013-03-05 LAB — CBC WITH DIFFERENTIAL/PLATELET
Basophils Relative: 0.3 % (ref 0.0–3.0)
Eosinophils Relative: 2 % (ref 0.0–5.0)
Lymphocytes Relative: 18.6 % (ref 12.0–46.0)
Neutrophils Relative %: 67.6 % (ref 43.0–77.0)
RBC: 3.99 Mil/uL (ref 3.87–5.11)
WBC: 5.1 10*3/uL (ref 4.5–10.5)

## 2013-05-26 ENCOUNTER — Ambulatory Visit (INDEPENDENT_AMBULATORY_CARE_PROVIDER_SITE_OTHER): Payer: Medicare Other | Admitting: Cardiovascular Disease

## 2013-05-26 ENCOUNTER — Ambulatory Visit: Payer: Medicare Other | Admitting: Pharmacist Clinician (PhC)/ Clinical Pharmacy Specialist

## 2013-05-26 ENCOUNTER — Ambulatory Visit (INDEPENDENT_AMBULATORY_CARE_PROVIDER_SITE_OTHER): Payer: Medicare Other | Admitting: Pharmacist Clinician (PhC)/ Clinical Pharmacy Specialist

## 2013-05-26 ENCOUNTER — Encounter: Payer: Self-pay | Admitting: Cardiovascular Disease

## 2013-05-26 VITALS — BP 122/80 | HR 78 | Ht 67.0 in | Wt 189.6 lb

## 2013-05-26 DIAGNOSIS — R0602 Shortness of breath: Secondary | ICD-10-CM

## 2013-05-26 DIAGNOSIS — I447 Left bundle-branch block, unspecified: Secondary | ICD-10-CM

## 2013-05-26 DIAGNOSIS — R609 Edema, unspecified: Secondary | ICD-10-CM

## 2013-05-26 DIAGNOSIS — R5383 Other fatigue: Secondary | ICD-10-CM

## 2013-05-26 DIAGNOSIS — R600 Localized edema: Secondary | ICD-10-CM

## 2013-05-26 DIAGNOSIS — I4891 Unspecified atrial fibrillation: Secondary | ICD-10-CM

## 2013-05-26 DIAGNOSIS — Z7901 Long term (current) use of anticoagulants: Secondary | ICD-10-CM

## 2013-05-26 DIAGNOSIS — R5381 Other malaise: Secondary | ICD-10-CM

## 2013-05-26 DIAGNOSIS — Z79899 Other long term (current) drug therapy: Secondary | ICD-10-CM

## 2013-05-26 MED ORDER — FUROSEMIDE 20 MG PO TABS: 20.0000 mg | ORAL_TABLET | Freq: Two times a day (BID) | ORAL | Status: DC

## 2013-05-26 NOTE — Patient Instructions (Addendum)
Increase the lasix to 20mg  twice day.  Have bloodwork done one morning before you have taken your digoxin. See Dr Tresa Endo back in 6 months.

## 2013-06-12 ENCOUNTER — Encounter: Payer: Self-pay | Admitting: Cardiovascular Disease

## 2013-06-12 DIAGNOSIS — I447 Left bundle-branch block, unspecified: Secondary | ICD-10-CM | POA: Insufficient documentation

## 2013-06-12 DIAGNOSIS — R600 Localized edema: Secondary | ICD-10-CM | POA: Insufficient documentation

## 2013-06-12 NOTE — Progress Notes (Signed)
Patient ID: [name], female   DOB: 06-28-1922, 79 y.o.   MRN: [id number]     HPI: [name], is a 79 y.o. female who has a remote history of a nonischemic myopathy as well as permanent atrial fibrillation for which she is on Coumadin anticoagulation. She has left bundle branch block. Additional problems include anemia, pancreatic insufficiency, remote peptic ulcer disease, compression fractures, as well as B12 deficiency. Remolding the past and her Coumadin was stopped she did develop bilateral pulmonary emboli and consequently has continued to be now on chronic Coumadin therapy. I last saw her approximately 5 months ago she apparently did have an episode of some mild GI bleeding in February. She does note some bilateral ankle swelling. She denies chest pain. She denies PND orthopnea.  Past Medical History  Diagnosis Date  . Abnormal liver function test   . IBS (irritable bowel syndrome)   . Pancreatic insufficiency     ? mass on imaging notborne out on follow-up  . Gastric ulcer   . Erosive gastritis 7/10  . Vitamin B12 deficiency   . Iron deficiency anemia   . Compression fracture of C-spine   . Pulmonary embolism     bilateral   . Diverticulosis   . Internal hemorrhoids   . Anemia     post hemorragic  . GI bleed   . Fibromyalgia   . Atrial fibrillation, chronic   . Osteoarthritis     back and lower extremities, neck    Past Surgical History  Procedure Laterality Date  . Tubal ligation    . Back surgery    . Left knee surgery    . Hernia repair    . Carpal tunnel release      right  . Wrist surgery      right plate  . Bladder surgery      tact  . Ovarian cyst removal    . Esophagogastroduodenoscopy  06/2009    erosive gastritis, small ulcer, hiatal hernia (negative biopsies)  . Colonoscopy  08/2009    diverticulosis, internal hemorrhoids  . Upper gastrointestinal endoscopy  05/30/11    normal  . Colonoscopy  06/02/11    Diverticulosis  . Capsule  endoscopy  06/04/11    normal  . Esophagogastroduodenoscopy  11/29/2011    Procedure: ESOPHAGOGASTRODUODENOSCOPY (EGD);  Surgeon: Louis Meckel, MD;  Location: [facility];  Service: Endoscopy;  Laterality: N/A;  . Givens capsule study  11/29/2011    Procedure: GIVENS CAPSULE STUDY;  Surgeon: Louis Meckel, MD;  Location: [facility];  Service: Endoscopy;  Laterality: N/A;  . Enteroscopy  12/02/2011    Procedure: ENTEROSCOPY;  Surgeon: Louis Meckel, MD;  Location: [facility];  Service: Endoscopy;  Laterality: N/A;  . Colonoscopy  12/03/2011    Procedure: COLONOSCOPY;  Surgeon: Louis Meckel, MD;  Location: [facility];  Service: Endoscopy;  Laterality: N/A;  . Colonoscopy  12/04/2011    Procedure: COLONOSCOPY;  Surgeon: Rob Bunting, MD;  Location: [facility];  Service: Endoscopy;  Laterality: N/A;  . Colonoscopy  11/06/2012    Procedure: COLONOSCOPY;  Surgeon: Rachael Fee, MD;  Location: [facility];  Service: Endoscopy;  Laterality: N/A;  . Colonoscopy N/A 02/12/2013    Procedure: COLONOSCOPY;  Surgeon: Rachael Fee, MD;  Location: [facility];  Service: Endoscopy;  Laterality: N/A;  . Hot hemostasis N/A 02/12/2013    Procedure: HOT HEMOSTASIS (ARGON PLASMA COAGULATION/BICAP);  Surgeon: Rachael Fee, MD;  Location: [facility];  Service: Endoscopy;  Laterality: N/A;    Allergies  Allergen Reactions  . Meperidine Hcl Nausea And Vomiting    REACTION: nausea: VOMIT  . Morphine Sulfate Nausea And Vomiting    REACTION: nausea: "VOMIT"    Current Outpatient Prescriptions  Medication Sig Dispense Refill  . cholecalciferol (VITAMIN D) 1000 UNITS tablet Take 1,000 Units by mouth daily.      . cyanocobalamin (,VITAMIN B-12,) 1000 MCG/ML injection Inject 1,000 mcg into the muscle every 3 (three) months.      . cycloSPORINE (RESTASIS) 0.05 % ophthalmic emulsion Place 1 drop into both eyes 2 (two) times daily.        . digoxin (LANOXIN) 0.125 MG tablet Take 125 mcg by  mouth daily.        . furosemide (LASIX) 20 MG tablet Take 1 tablet (20 mg total) by mouth 2 (two) times daily.  180 tablet  3  . HYDROcodone-acetaminophen (VICODIN) 5-500 MG per tablet TAKE 1 TABLET BY MOUTH EVERY 4 TO 6 HOURS AS NEEDED FOR PAIN  20 tablet  0  . Lutein 20 MG TABS Take 1 tablet by mouth daily.        . nebivolol (BYSTOLIC) 5 MG tablet Take 2.5 mg by mouth daily.       . Pancrelipase, Lip-Prot-Amyl, (CREON) 24000 UNITS CPEP Take by mouth. Take 1 tablet by mouth three times a day ac meals and snacks       . warfarin (COUMADIN) 2 MG tablet Per INR       Current Facility-Administered Medications  Medication Dose Route Frequency Provider Last Rate Last Dose  . cyanocobalamin ((VITAMIN B-12)) injection 1,000 mcg  1,000 mcg Intramuscular Q30 days Iva Boop, MD   1,000 mcg at 12/24/12 1350    Socially she is widowed. She has 3 children 4 grandchildren. There is no tobacco or alcohol use.  ROS is negative for fevers, chills or night sweats.  She denies recent coughing. She denies chest pain. She denies orthopnea. She denies presyncope or syncope she denies abdominal pain. He continues to notice bilateral ankle swelling. He denies neurologic symptoms. Other system review is negative.  PE BP 122/80  Pulse 78  Ht 5\' 7"  (1.702 m)  Wt 189 lb 9.6 oz (86.002 kg)  BMI 29.69 kg/m2  General: Alert, oriented, no distress.  Skin: normal turgor, no rashes HEENT: Normocephalic, atraumatic. Pupils round and reactive; sclera anicteric;no lid lag.  Nose without nasal septal hypertrophy Mouth/Parynx benign; Mallinpatti scale 2/3 Neck: No JVD, no carotid briuts Lungs: clear to ausculatation and percussion; no wheezing or rales Heart: Irregularly irregular with an occasional ectopic complex;  s1 s2 normal  Abdomen: soft, nontender; no hepatosplenomehaly, BS+; abdominal aorta nontender and not dilated by palpation. Pulses 2+ Extremities: no clubbing cyanosis or edema, Homan's sign negative    Neurologic: grossly nonfocal  ECG: Atrial fibrillation at 78 beats per minute. She does have left bundle branch block. There is an occasional ectopic complex most likely aberrantly conducted versus PVC.  LABS:  BMET    Component Value Date/Time   NA 143 11/05/2012 0454   K 3.5 11/05/2012 0454   CL 106 11/05/2012 0454   CO2 27 11/05/2012 0454   GLUCOSE 92 11/05/2012 0454   BUN 18 11/05/2012 0454   CREATININE 0.78 11/05/2012 0454   CALCIUM 9.0 11/05/2012 0454   GFRNONAA 73* 11/05/2012 0454   GFRAA 84* 11/05/2012 0454     Hepatic Function Panel     Component Value Date/Time  PROT 5.2* 11/05/2012 0454   ALBUMIN 3.1* 11/05/2012 0454   AST 10 11/05/2012 0454   ALT 8 11/05/2012 0454   ALKPHOS 58 11/05/2012 0454   BILITOT 1.0 11/05/2012 0454     CBC    Component Value Date/Time   WBC 5.1 03/05/2013 1200   RBC 3.99 03/05/2013 1200   HGB 10.5* 03/05/2013 1200   HCT 32.8* 03/05/2013 1200   PLT 325.0 03/05/2013 1200   MCV 82.2 03/05/2013 1200   MCH 27.9 02/13/2013 0445   MCHC 32.0 03/05/2013 1200   RDW 15.9* 03/05/2013 1200   LYMPHSABS 1.0 03/05/2013 1200   MONOABS 0.6 03/05/2013 1200   EOSABS 0.1 03/05/2013 1200   BASOSABS 0.0 03/05/2013 1200     BNP    Component Value Date/Time   PROBNP 5261.0* 12/18/2011 1306    Lipid Panel  No results found for this basename: chol, trig, hdl, cholhdl, vldl, ldlcalc     RADIOLOGY: No results found.    ASSESSMENT AND PLAN: [name] is atrial fibrillation rate is controlled on current therapy systolic and digoxin. She continues to experience bilateral ankle swelling. I'm recommending she further increase furosemide to 20 mg twice a day. Also recommended support stockings. She will be taking a trip to [place] this summer. We will check laboratory prior to her visit. I will see her in 6 months for cardiology reevaluation.     Lennette Bihari, MD, [facility]  06/12/2013 10:45 AM

## 2013-06-18 LAB — CBC
MCH: 22.8 pg — ABNORMAL LOW (ref 26.0–34.0)
Platelets: 287 10*3/uL (ref 150–400)
RBC: 5.01 MIL/uL (ref 3.87–5.11)
RDW: 20.7 % — ABNORMAL HIGH (ref 11.5–15.5)
WBC: 4.4 10*3/uL (ref 4.0–10.5)

## 2013-06-18 LAB — TSH: TSH: 2.185 u[IU]/mL (ref 0.350–4.500)

## 2013-06-18 LAB — COMPREHENSIVE METABOLIC PANEL
Albumin: 3.9 g/dL (ref 3.5–5.2)
Alkaline Phosphatase: 56 U/L (ref 39–117)
CO2: 25 mEq/L (ref 19–32)
Glucose, Bld: 101 mg/dL — ABNORMAL HIGH (ref 70–99)
Potassium: 3.8 mEq/L (ref 3.5–5.3)
Sodium: 141 mEq/L (ref 135–145)
Total Protein: 6.2 g/dL (ref 6.0–8.3)

## 2013-06-18 LAB — DIGOXIN LEVEL: Digoxin Level: 0.5 ng/mL — ABNORMAL LOW (ref 0.8–2.0)

## 2013-06-25 IMAGING — CT CT ENTEROGRAPHY (ABD-PELV W/ CM)
2 of 5 series · 17 of 46 positions shown, 19 images · IV contrast (APPLIED)
Comparison: 11/28/2011

CLINICAL DATA: Abdominal pain with occult bleeding.

CT ABDOMEN AND PELVIS WITH CONTRAST (CT ENTEROGRAPHY)
TECHNIQUE: Multidetector CT of the abdomen and pelvis during bolus
administration of intravenous contrast. Negative oral contrast
VoLumen was given.
Contrast: 100mL OMNIPAQUE IOHEXOL 300 MG/ML IV SOLN

[Series 3: enterography thins 3's · axial · 0.83mm/px · z∈[+786,+1146]mm · 14 of 137 slices shown, 16 images]
[im 9/137  soft-tissue]
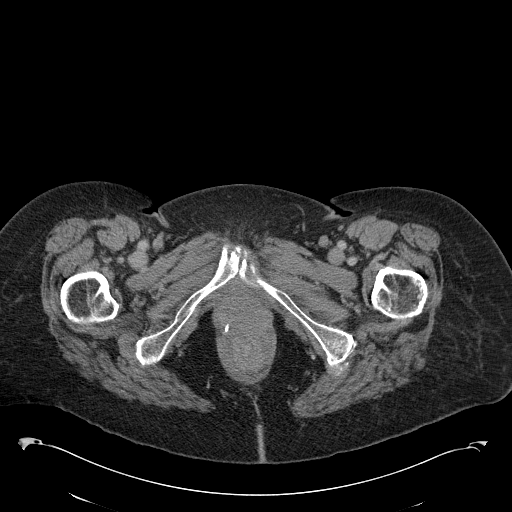
[im 9/137  bone]
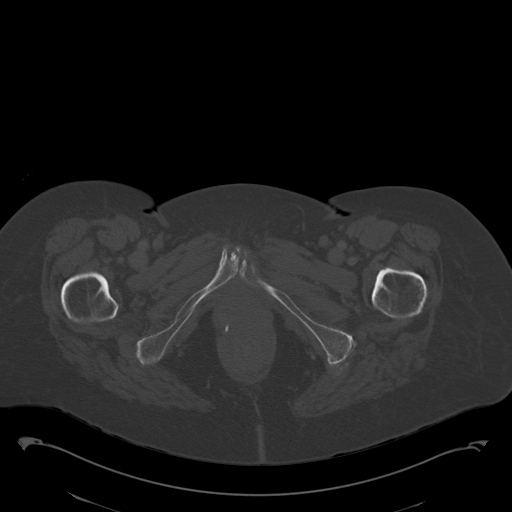
[im 17/137  soft-tissue]
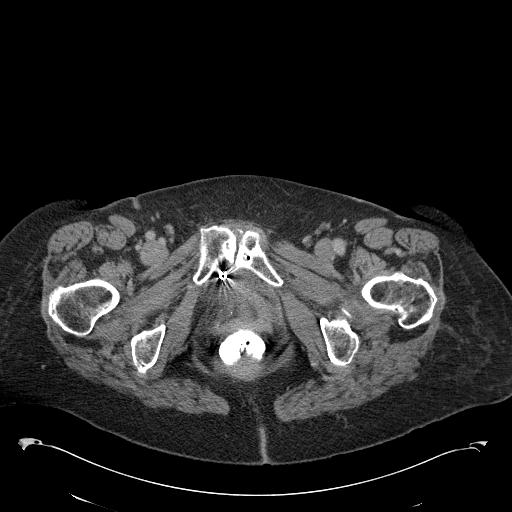
[im 25/137  soft-tissue]
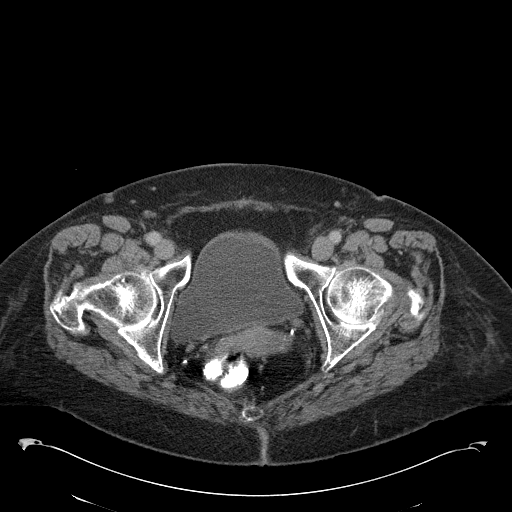
[im 41/137  soft-tissue]
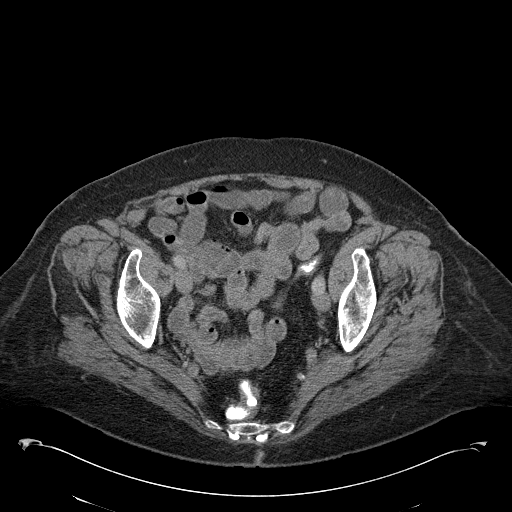
[im 49/137  soft-tissue]
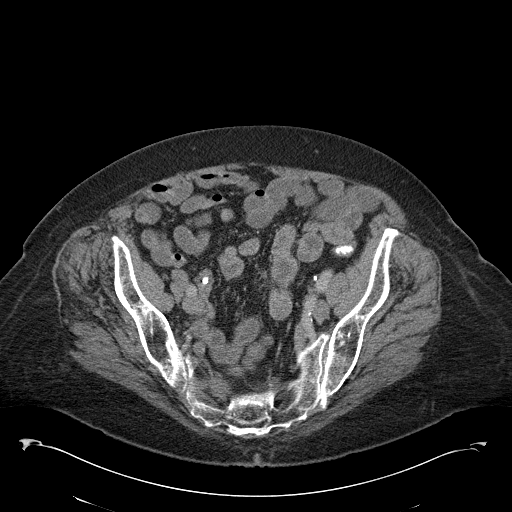
[im 57/137  soft-tissue]
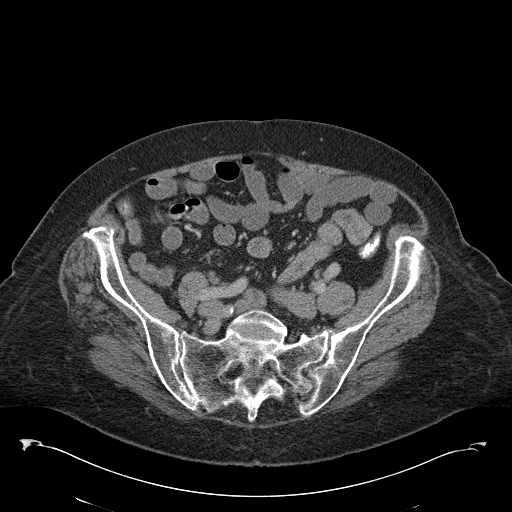
[im 65/137  soft-tissue]
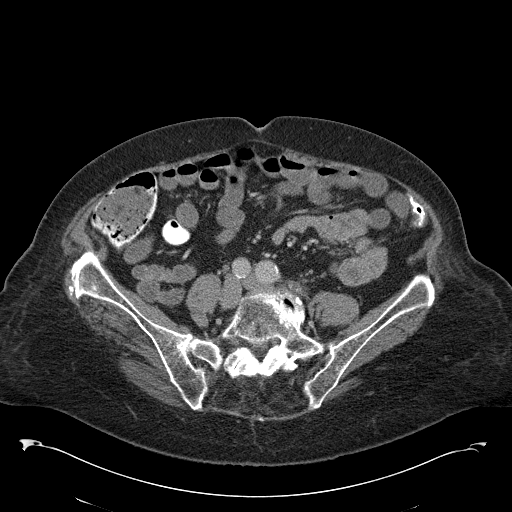
[im 73/137  soft-tissue]
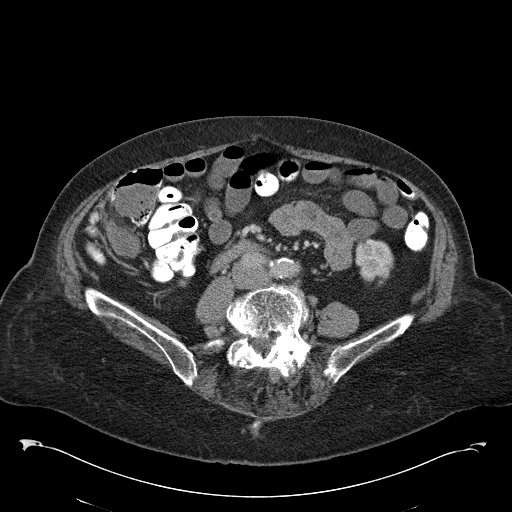
[im 81/137  soft-tissue]
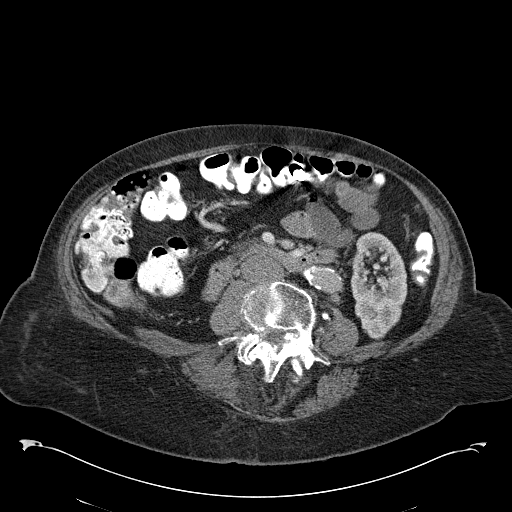
[im 81/137  bone]
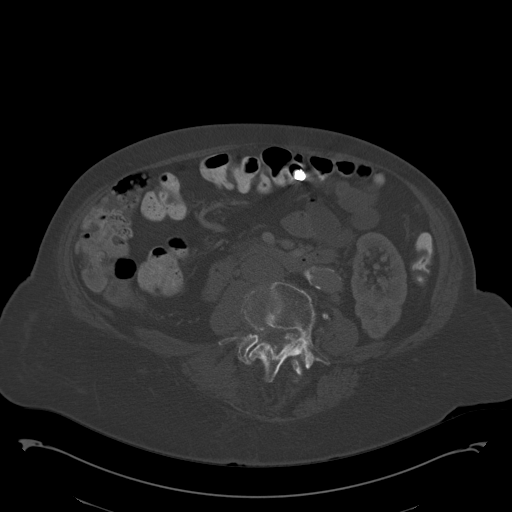
[im 89/137  soft-tissue]
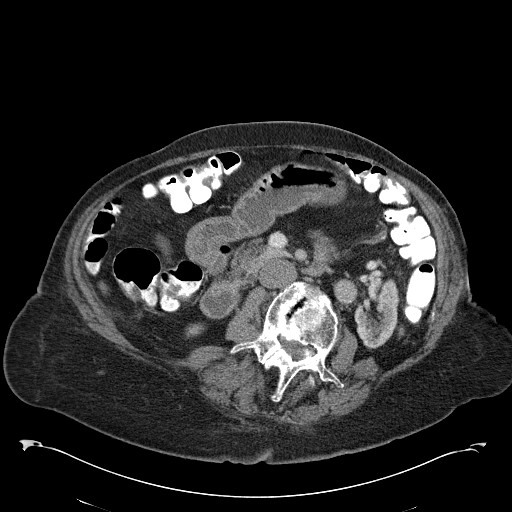
[im 105/137  soft-tissue]
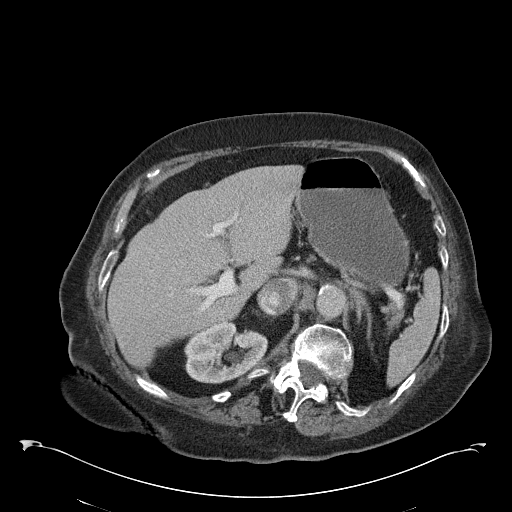
[im 113/137  soft-tissue]
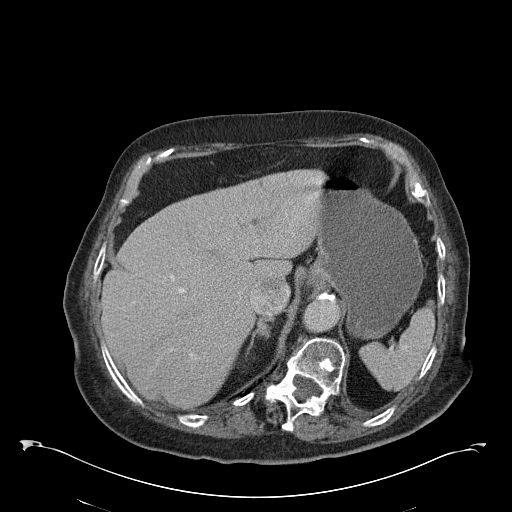
[im 121/137  soft-tissue]
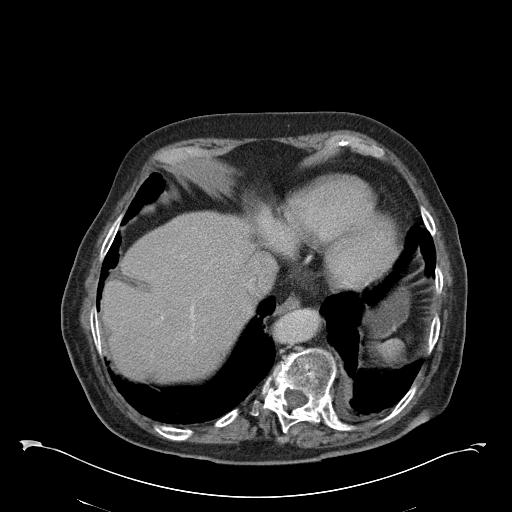
[im 129/137  soft-tissue]
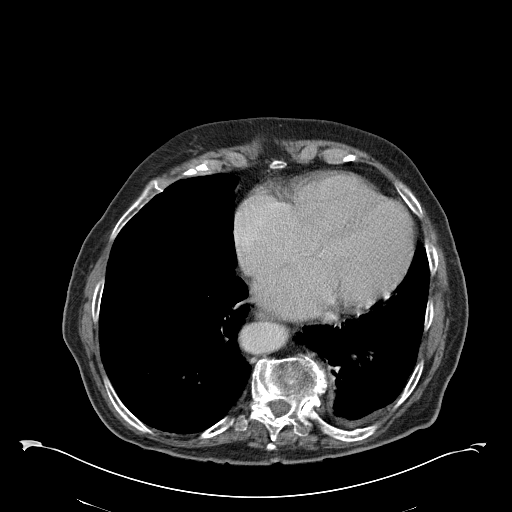

[Series 603: cor · coronal · 0.83mm/px · 3 of 134 slices shown]
[im 45/134  soft-tissue]
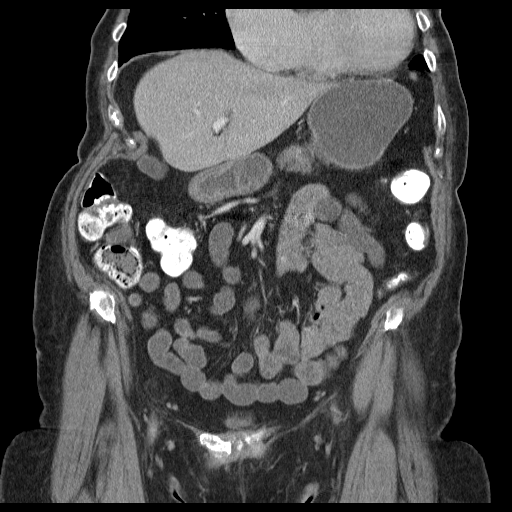
[im 60/134  soft-tissue]
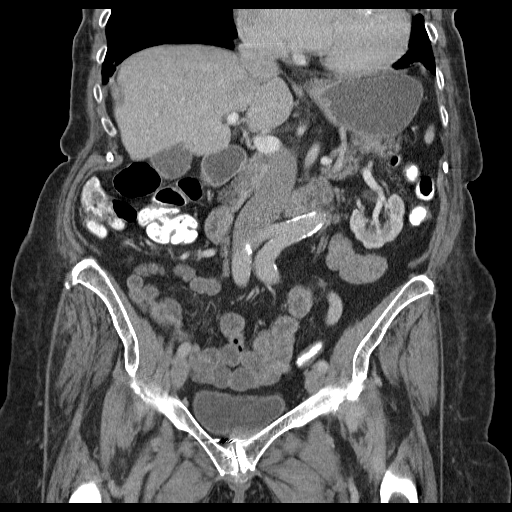
[im 74/134  soft-tissue]
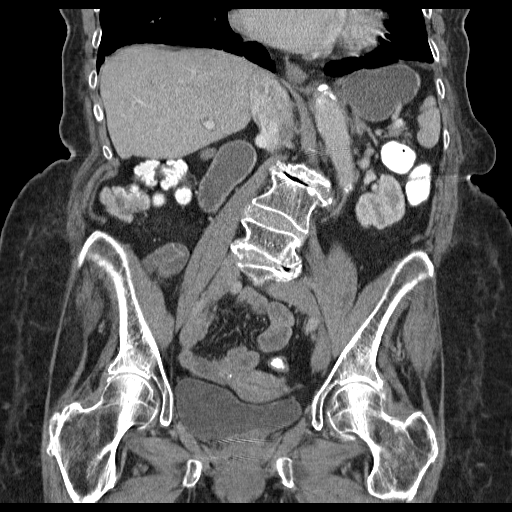

[17 of 46 positions shown; findings below may reference images not displayed]

FINDINGS: No abnormal mural enhancement in the stomach.  No
abnormal mucosal or transmural enhancement in the small bowel.  The
colon cannot be evaluated secondary to the presence of positive
contrast agent within the colonic lumen to residual from the CT
scan of 2 days ago.  No abnormal small bowel wall thickening.  No
small bowel mass lesion is evident.

Low density lesions in the left liver were present on a CT scan
from 2669.  Spleen is unremarkable.  The patient does have a 12 mm
splenic artery aneurysm in the region of the hilum.  The pancreas,
gallbladder, and adrenal glands are unremarkable.  Left kidney is
malrotated and has a duplicated collecting system with at least
partial duplication of the left ureter.  Right kidney is normal.

No abdominal aortic aneurysm.  There is no evidence for free fluid
or lymphadenopathy in the abdomen.

Imaging through the pelvis shows no free fluid.  Bladder is
unremarkable.  Uterus is normal.  There is no adnexal mass.

No colonic diverticulitis.  The terminal ileum is normal. The
appendix is not visualized, but there is no edema or inflammation
in the region of the cecum.

Thoracolumbar scoliosis is evident with degenerative changes in the
lumbar spine.
IMPRESSION: No CT findings to explain the patient's history of occult GI
bleeding.

Splenic artery aneurysm.

## 2013-06-30 NOTE — Progress Notes (Signed)
Quick Note:  Labs Okay per Dr. Tresa Endo. Results released to Mychart. ______

## 2013-07-04 ENCOUNTER — Ambulatory Visit (INDEPENDENT_AMBULATORY_CARE_PROVIDER_SITE_OTHER): Payer: Medicare Other | Admitting: Pharmacist Clinician (PhC)/ Clinical Pharmacy Specialist

## 2013-07-04 VITALS — BP 130/80 | HR 68

## 2013-07-04 DIAGNOSIS — Z7901 Long term (current) use of anticoagulants: Secondary | ICD-10-CM

## 2013-07-04 DIAGNOSIS — I4891 Unspecified atrial fibrillation: Secondary | ICD-10-CM

## 2013-07-04 MED ORDER — NEBIVOLOL HCL 5 MG PO TABS: 2.5000 mg | ORAL_TABLET | Freq: Every day | ORAL | Status: DC

## 2013-07-23 ENCOUNTER — Other Ambulatory Visit: Payer: Self-pay

## 2013-07-30 ENCOUNTER — Ambulatory Visit (INDEPENDENT_AMBULATORY_CARE_PROVIDER_SITE_OTHER): Payer: Medicare Other | Admitting: Pharmacist Clinician (PhC)/ Clinical Pharmacy Specialist

## 2013-07-30 VITALS — BP 122/62 | HR 76

## 2013-07-30 DIAGNOSIS — Z7901 Long term (current) use of anticoagulants: Secondary | ICD-10-CM

## 2013-07-30 DIAGNOSIS — I4891 Unspecified atrial fibrillation: Secondary | ICD-10-CM

## 2013-08-27 ENCOUNTER — Ambulatory Visit (INDEPENDENT_AMBULATORY_CARE_PROVIDER_SITE_OTHER): Payer: Medicare Other | Admitting: Pharmacist Clinician (PhC)/ Clinical Pharmacy Specialist

## 2013-08-27 VITALS — BP 120/62 | HR 68

## 2013-08-27 DIAGNOSIS — I4891 Unspecified atrial fibrillation: Secondary | ICD-10-CM

## 2013-08-27 DIAGNOSIS — Z7901 Long term (current) use of anticoagulants: Secondary | ICD-10-CM

## 2013-09-19 ENCOUNTER — Ambulatory Visit (HOSPITAL_COMMUNITY)
Admission: RE | Admit: 2013-09-19 | Discharge: 2013-09-19 | Disposition: A | Discharge status: Home / Self Care | Payer: Medicare Other | Source: Ambulatory Visit | Attending: Family Medicine | Admitting: Family Medicine

## 2013-09-19 ENCOUNTER — Other Ambulatory Visit (HOSPITAL_COMMUNITY): Payer: Self-pay | Admitting: Family Medicine

## 2013-09-19 DIAGNOSIS — M899 Disorder of bone, unspecified: Secondary | ICD-10-CM | POA: Insufficient documentation

## 2013-09-19 DIAGNOSIS — R52 Pain, unspecified: Secondary | ICD-10-CM

## 2013-09-19 DIAGNOSIS — R05 Cough: Secondary | ICD-10-CM | POA: Insufficient documentation

## 2013-09-19 DIAGNOSIS — R079 Chest pain, unspecified: Secondary | ICD-10-CM | POA: Insufficient documentation

## 2013-09-19 DIAGNOSIS — R059 Cough, unspecified: Secondary | ICD-10-CM | POA: Insufficient documentation

## 2013-09-19 DIAGNOSIS — M4120 Other idiopathic scoliosis, site unspecified: Secondary | ICD-10-CM | POA: Insufficient documentation

## 2013-09-24 ENCOUNTER — Ambulatory Visit (INDEPENDENT_AMBULATORY_CARE_PROVIDER_SITE_OTHER): Payer: Medicare Other | Admitting: Pharmacist Clinician (PhC)/ Clinical Pharmacy Specialist

## 2013-09-24 VITALS — BP 118/62 | HR 72

## 2013-09-24 DIAGNOSIS — I4891 Unspecified atrial fibrillation: Secondary | ICD-10-CM

## 2013-09-24 DIAGNOSIS — Z7901 Long term (current) use of anticoagulants: Secondary | ICD-10-CM

## 2013-09-24 LAB — POCT INR: INR: 3.9

## 2013-10-08 ENCOUNTER — Other Ambulatory Visit: Payer: Self-pay | Admitting: Pharmacist Clinician (PhC)/ Clinical Pharmacy Specialist

## 2013-10-08 ENCOUNTER — Ambulatory Visit (INDEPENDENT_AMBULATORY_CARE_PROVIDER_SITE_OTHER): Payer: Medicare Other | Admitting: Pharmacist Clinician (PhC)/ Clinical Pharmacy Specialist

## 2013-10-08 VITALS — BP 132/62 | HR 68

## 2013-10-08 DIAGNOSIS — Z7901 Long term (current) use of anticoagulants: Secondary | ICD-10-CM

## 2013-10-08 DIAGNOSIS — I4891 Unspecified atrial fibrillation: Secondary | ICD-10-CM

## 2013-10-08 MED ORDER — NEBIVOLOL HCL 2.5 MG PO TABS: 2.5000 mg | ORAL_TABLET | Freq: Every day | ORAL | Status: AC

## 2013-10-23 ENCOUNTER — Other Ambulatory Visit: Payer: Self-pay

## 2013-10-27 ENCOUNTER — Other Ambulatory Visit (INDEPENDENT_AMBULATORY_CARE_PROVIDER_SITE_OTHER): Payer: Medicare Other

## 2013-10-27 ENCOUNTER — Ambulatory Visit (INDEPENDENT_AMBULATORY_CARE_PROVIDER_SITE_OTHER): Payer: Medicare Other | Admitting: Physician Assistant

## 2013-10-27 ENCOUNTER — Other Ambulatory Visit: Payer: Self-pay | Admitting: Cardiovascular Disease

## 2013-10-27 ENCOUNTER — Telehealth: Payer: Self-pay | Admitting: Internal Medicine

## 2013-10-27 ENCOUNTER — Ambulatory Visit (INDEPENDENT_AMBULATORY_CARE_PROVIDER_SITE_OTHER)
Admission: RE | Admit: 2013-10-27 | Discharge: 2013-10-27 | Disposition: A | Discharge status: Home / Self Care | Payer: Medicare Other | Source: Ambulatory Visit | Attending: Physician Assistant | Admitting: Physician Assistant

## 2013-10-27 ENCOUNTER — Encounter: Payer: Self-pay | Admitting: Physician Assistant

## 2013-10-27 VITALS — BP 110/60 | HR 76 | Ht 65.0 in | Wt 181.0 lb

## 2013-10-27 DIAGNOSIS — R140 Abdominal distension (gaseous): Secondary | ICD-10-CM

## 2013-10-27 DIAGNOSIS — R141 Gas pain: Secondary | ICD-10-CM

## 2013-10-27 DIAGNOSIS — IMO0002 Reserved for concepts with insufficient information to code with codable children: Secondary | ICD-10-CM

## 2013-10-27 DIAGNOSIS — IMO0001 Reserved for inherently not codable concepts without codable children: Secondary | ICD-10-CM

## 2013-10-27 DIAGNOSIS — R142 Eructation: Secondary | ICD-10-CM

## 2013-10-27 DIAGNOSIS — R109 Unspecified abdominal pain: Secondary | ICD-10-CM

## 2013-10-27 LAB — COMPREHENSIVE METABOLIC PANEL
Albumin: 3.9 g/dL (ref 3.5–5.2)
BUN: 16 mg/dL (ref 6–23)
CO2: 26 mEq/L (ref 19–32)
Calcium: 9.3 mg/dL (ref 8.4–10.5)
Chloride: 105 mEq/L (ref 96–112)
Creatinine, Ser: 0.9 mg/dL (ref 0.4–1.2)
GFR: 61.79 mL/min (ref >60.00)
Glucose, Bld: 119 mg/dL — ABNORMAL HIGH (ref 70–99)

## 2013-10-27 LAB — CBC WITH DIFFERENTIAL/PLATELET
Basophils Relative: 0.4 % (ref 0.0–3.0)
Eosinophils Absolute: 0 10*3/uL (ref 0.0–0.7)
Eosinophils Relative: 0.7 % (ref 0.0–5.0)
Hemoglobin: 11.9 g/dL — ABNORMAL LOW (ref 12.0–15.0)
Lymphocytes Relative: 18.2 % (ref 12.0–46.0)
MCHC: 31.6 g/dL (ref 30.0–36.0)
Monocytes Relative: 9.9 % (ref 3.0–12.0)
Neutro Abs: 3.4 10*3/uL (ref 1.4–7.7)
RBC: 4.9 Mil/uL (ref 3.87–5.11)
WBC: 4.8 10*3/uL (ref 4.5–10.5)

## 2013-10-27 MED ORDER — OMEPRAZOLE-SODIUM BICARBONATE 40-1100 MG PO CAPS: 1.0000 | ORAL_CAPSULE | Freq: Every day | ORAL | Status: DC

## 2013-10-27 MED ORDER — TRAMADOL HCL 50 MG PO TABS: ORAL_TABLET | ORAL | Status: DC

## 2013-10-27 MED ORDER — METHOCARBAMOL 500 MG PO TABS: 500.0000 mg | ORAL_TABLET | Freq: Three times a day (TID) | ORAL | Status: DC

## 2013-10-27 NOTE — Telephone Encounter (Signed)
Patient reports that she has belching and back rib pain.  She reports that if she takes hydrocodone and "sits really still in my recliner" that the pain is relieved.  She states that the belching exacerbates the pain.  She has seen her primary care and they have done xrays of her ribs and that they were fine.  She believes that this is a GI problem and wants to be seen.  She will come in today and see Amy Esterwood PA at 1:30

## 2013-10-27 NOTE — Progress Notes (Signed)
Agree with [name]'s assessment and plan. Christine E. Gessner, MD, FACG   

## 2013-10-27 NOTE — Telephone Encounter (Signed)
Rx was sent to pharmacy electronically. 

## 2013-10-27 NOTE — Patient Instructions (Addendum)
Please go to the basement level to have your labs drawn. Lab is on the left from the elevator. Also go to the Radiology department, basement level. Out of the elevator,  X-Ray department is on the right past the vending machines.  We have given you a prescription for Tramadol ( Ultram )  To take to your pharmacy.  We also sent a prescription to your pharmacy for Robaxin , for pain. We sent that electronically.  We have given you samples of Zegerid 40 mg. Take 1 tab before breakfast in the Am.

## 2013-10-27 NOTE — Progress Notes (Signed)
Subjective:    Patient ID: [name], female    DOB: 26-Nov-1922, 79 y.o.   MRN: [id number]  HPI  [name] is a very nice 79 year old female known to Dr. Leone Payor. 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 She also has history of chronic atrial fibrillation is on chronic Coumadin therapy. She hasn't nonischemic cardiomyopathy, left bundle branch block, history of pancreatic insufficiency remote peptic ulcer disease and had bilateral PEs during a period of time while she was off of Coumadin a few years ago. She also has previously documented T8-T9 compression fractures and has had a multilevel lumbar laminectomy. She had EGD in 2012 which was normal. She comes in today with complaints of right back pain and belching. She says she has been very uncomfortable over the past 6 weeks with persistent pain in her right back which she describes as being somewhat wavelike in nature. She says she holds very still or is able to put pressure on her back this seems to help. Whenever she is having the pain she seems to have a lot of belching and burping. She does feel that the pain is exacerbated by movement. She does not feel there is any association with by mouth intake. She denies any heartburn, indigestion, dysphagia or odynophagia, she has not had any abdominal pain nausea or vomiting. Her bowel movements have been fairly normal -occasionally she does see some dark stool but does not think she's seen any blood. She see her primary care physician in early October and had rib details done which did not show any new fractures but did show some old posterior fractures. She has been using hydrocodone for pain but feels that this does make her sleepy. She has not been on any regular PPI.   Her daughter states that she had had a bad cold a couple of months ago and did have a lot of coughing  during that time and says that the pain came on sometime during that episode.  Review of Systems  Constitutional: Positive for activity change.  HENT: Negative.   Eyes: Negative.   Respiratory: Negative.   Cardiovascular: Negative.   Gastrointestinal: Negative.   Endocrine: Negative.   Genitourinary: Negative.   Musculoskeletal: Positive for arthralgias, back pain and gait problem.  Allergic/Immunologic: Negative.   Neurological: Negative.   Hematological: Negative.   Psychiatric/Behavioral: Negative.    Outpatient Encounter Prescriptions as of 10/27/2013  Medication Sig  . cholecalciferol (VITAMIN D) 1000 UNITS tablet Take 1,000 Units by mouth daily.  . cyanocobalamin (,VITAMIN B-12,) 1000 MCG/ML injection Inject 1,000 mcg into the muscle every 3 (three) months.  . cycloSPORINE (RESTASIS) 0.05 % ophthalmic emulsion Place 1 drop into both eyes 2 (two) times daily.    . digoxin (LANOXIN) 0.125 MG tablet Take 125 mcg by mouth daily.    . furosemide (LASIX) 20 MG tablet Take 1 tablet (20 mg total) by mouth 2 (two) times daily.  Marland Kitchen HYDROcodone-acetaminophen (VICODIN) 5-500 MG per tablet TAKE 1 TABLET BY MOUTH EVERY 4 TO 6 HOURS AS NEEDED FOR PAIN  . lidocaine (LIDODERM) 5 % Place 1 patch onto the skin as needed. Remove & Discard patch within 12 hours or as directed by MD  . Lutein 20 MG TABS Take 1 tablet by mouth daily.    . nebivolol (BYSTOLIC) 2.5 MG tablet Take 1 tablet (2.5 mg total) by  mouth daily.  . Pancrelipase, Lip-Prot-Amyl, (CREON) 24000 UNITS CPEP Take by mouth. Take 1 tablet by mouth three times a day ac meals and snacks   . warfarin (COUMADIN) 2 MG tablet Per INR  . methocarbamol (ROBAXIN) 500 MG tablet Take 1 tablet (500 mg total) by mouth 3 (three) times daily.  Marland Kitchen omeprazole-sodium bicarbonate (ZEGERID) 40-1100 MG per capsule Take 1 capsule by mouth daily before breakfast.  . traMADol (ULTRAM) 50 MG tablet Take 1 tab every 4-6 hours as needed for pain.   Allergies   Allergen Reactions  . Meperidine Hcl Nausea And Vomiting    REACTION: nausea: VOMIT  . Morphine Sulfate Nausea And Vomiting    REACTION: nausea: "VOMIT"   Patient Active Problem List   Diagnosis Date Noted  . Left bundle branch block 06/12/2013  . Edema, lower extremity 06/12/2013  . Long term (current) use of anticoagulants 03/05/2013  . Lower GI bleed 11/04/2012  . Hemorrhage of gastrointestinal tract, Dieaulafoy's lesion hepatic flexure 05/29/2011  . PANCREATIC INSUFFICIENCY 10/07/2009  . VITAMIN B12 DEFICIENCY 08/20/2009  . ATRIAL FIBRILLATION, CHRONIC 02/24/2008  . Irritable bowel syndrome 02/24/2008     History  Substance Use Topics  . Smoking status: Never Smoker   . Smokeless tobacco: Never Used  . Alcohol Use: Yes     Comment: glass of wine occasionaly      .family history includes Heart disease in her mother. There is no history of Anesthesia problems, Malignant hyperthermia, or Colon cancer.  Objective:   Physical Exam  well-developed elderly white female uncomfortable appearing and holding her back. She is intermittently belching Blood pressure 110/60 pulse 76 height 5 foot 5 weight 181. HEENT;nontraumatic normocephalic EOMI PERRLA sclera anicteric, Supple; no JVD, Cardiovascula;r regular rate and rhythm with S1-S2 no murmur or gallop, Pulmonary; clear bilaterally, Back she is tender over the right lower posterior ribs, no tenderness to palpation of the spine, no lesions noted, Abdomen; soft nontender nondistended bowel sounds are active there is no palpable mass or hepatosplenomegaly        Assessment & Plan:   #60  79 year old female with at least 6 week history of persistent right back pain which has been associated with belching. I think that her pain is of musculoskeletal in etiology and that she is having associated spasms. I'm not sure how belching is tied in the wonder she's having increased aerophagia due to pain.  I am concerned that she may have a  compression fracture.  Prior gallbladder workup in 2012 with ultrasound and HIDA scan were negative   #2 chronic Coumadin  #3 atrial fibrillation  #4 history of recurrent GI bleeding last bleed February 2014 secondary to a dieulafoy  lesion of the hepatic flexure  #5  diverticulosis  #6 prior history of compression fractures and patient has also had a multilevel lumbar laminectomy   Plan ; CBC and CMET today Prevacid spine films  Start daily PPI -she was given samples of Zegerid today  Trial of Ultram 50 mg one every 4-6 hours as needed for pain Heating pad to right back Trial of Robaxin 500 mg up to 3 times daily as needed for muscle spasms Further plans pending results of above .

## 2013-10-29 ENCOUNTER — Ambulatory Visit (INDEPENDENT_AMBULATORY_CARE_PROVIDER_SITE_OTHER): Payer: Medicare Other | Admitting: Pharmacist Clinician (PhC)/ Clinical Pharmacy Specialist

## 2013-10-29 VITALS — BP 140/80 | HR 68

## 2013-10-29 DIAGNOSIS — I4891 Unspecified atrial fibrillation: Secondary | ICD-10-CM

## 2013-10-29 DIAGNOSIS — Z7901 Long term (current) use of anticoagulants: Secondary | ICD-10-CM

## 2013-10-29 LAB — POCT INR: INR: 5

## 2013-11-05 ENCOUNTER — Ambulatory Visit (INDEPENDENT_AMBULATORY_CARE_PROVIDER_SITE_OTHER): Payer: Medicare Other | Admitting: Pharmacist Clinician (PhC)/ Clinical Pharmacy Specialist

## 2013-11-05 VITALS — BP 144/70 | HR 68

## 2013-11-05 DIAGNOSIS — Z7901 Long term (current) use of anticoagulants: Secondary | ICD-10-CM

## 2013-11-05 DIAGNOSIS — I4891 Unspecified atrial fibrillation: Secondary | ICD-10-CM

## 2013-11-05 LAB — POCT INR: INR: 2.1

## 2013-11-18 ENCOUNTER — Encounter: Payer: Self-pay | Admitting: Cardiovascular Disease

## 2013-11-18 ENCOUNTER — Encounter: Payer: Self-pay | Admitting: Cardiology

## 2013-11-19 ENCOUNTER — Ambulatory Visit (INDEPENDENT_AMBULATORY_CARE_PROVIDER_SITE_OTHER): Payer: Medicare Other | Admitting: Cardiovascular Disease

## 2013-11-19 ENCOUNTER — Encounter: Payer: Self-pay | Admitting: Cardiovascular Disease

## 2013-11-19 ENCOUNTER — Ambulatory Visit (INDEPENDENT_AMBULATORY_CARE_PROVIDER_SITE_OTHER): Payer: Medicare Other | Admitting: Pharmacist Clinician (PhC)/ Clinical Pharmacy Specialist

## 2013-11-19 VITALS — BP 130/86 | HR 80 | Ht 67.0 in | Wt 180.8 lb

## 2013-11-19 DIAGNOSIS — Z7901 Long term (current) use of anticoagulants: Secondary | ICD-10-CM

## 2013-11-19 DIAGNOSIS — I447 Left bundle-branch block, unspecified: Secondary | ICD-10-CM

## 2013-11-19 DIAGNOSIS — I4891 Unspecified atrial fibrillation: Secondary | ICD-10-CM

## 2013-11-19 LAB — POCT INR: INR: 1.6

## 2013-11-19 NOTE — Patient Instructions (Signed)
Your physician recommends that you schedule a follow-up appointment in: 6 MONTHS. No changes were made today in your therapy. 

## 2013-11-22 ENCOUNTER — Encounter: Payer: Self-pay | Admitting: Cardiovascular Disease

## 2013-11-22 NOTE — Progress Notes (Signed)
Patient ID: [name], female   DOB: 07-28-1922, 79 y.o.   MRN: [id number]      HPI: [name], is a 79 y.o. female who has a remote history of a nonischemic cardiomyopathy as well as permanent atrial fibrillation for which she is on Coumadin anticoagulation. She has left bundle branch block. Additional problems include anemia, pancreatic insufficiency, remote peptic ulcer disease, compression fractures, as well as B12 deficiency. In he past after her Coumadin was stopped she did develop bilateral pulmonary emboli and consequently is on chronic Coumadin therapy. I last saw her approximately 6 months ago. In February, she did experience mild GI bleeding She does note some bilateral ankle swelling. She denies chest pain. She denies PND orthopnea. Since I last saw her, she went on her trip to [place]. She traveled without difficulty. She did not note significant leg swelling. She denies chest pain. She denies significant shortness of breath.  Past Medical History  Diagnosis Date  . Abnormal liver function test     with amiodarone  . IBS (irritable bowel syndrome)   . Pancreatic insufficiency     ? mass on imaging notborne out on follow-up  . Gastric ulcer   . Erosive gastritis 7/10  . Vitamin B12 deficiency   . Iron deficiency anemia   . Compression fracture of C-spine   . Pulmonary embolism 2013    bilateral   . Diverticulosis   . Internal hemorrhoids   . Anemia     post hemorragic  . GI bleed   . Fibromyalgia   . Atrial fibrillation, chronic   . Osteoarthritis     back and lower extremities, neck  . LBBB (left bundle branch block)   . Nonischemic cardiomyopathy     Hx of-1998 EF 40%, improved to 50-55%  . Anticoagulated on Coumadin     for atrial fib  . Echocardiogram abnormal 02/13/2012    EF 50-55%, LA severely dilated, mild MR, mild TR, rvSYST. PRESSURE 30-40  . H/O cardiovascular stress test 03/30/2011    no ischemia, no change from previuos scan  . SSS (sick sinus  syndrome) 2012    mild, lowest HR wth biowatch, 43.  fastest 110    Past Surgical History  Procedure Laterality Date  . Tubal ligation    . Back surgery    . Left knee surgery    . Hernia repair    . Carpal tunnel release      right  . Wrist surgery      right plate  . Bladder surgery      tact  . Ovarian cyst removal    . Esophagogastroduodenoscopy  06/2009    erosive gastritis, small ulcer, hiatal hernia (negative biopsies)  . Colonoscopy  08/2009    diverticulosis, internal hemorrhoids  . Upper gastrointestinal endoscopy  05/30/11    normal  . Colonoscopy  06/02/11    Diverticulosis  . Capsule endoscopy  06/04/11    normal  . Esophagogastroduodenoscopy  11/29/2011    Procedure: ESOPHAGOGASTRODUODENOSCOPY (EGD);  Surgeon: Louis Meckel, MD;  Location: [facility];  Service: Endoscopy;  Laterality: N/A;  . Givens capsule study  11/29/2011    Procedure: GIVENS CAPSULE STUDY;  Surgeon: Louis Meckel, MD;  Location: [facility];  Service: Endoscopy;  Laterality: N/A;  . Enteroscopy  12/02/2011    Procedure: ENTEROSCOPY;  Surgeon: Louis Meckel, MD;  Location: [facility];  Service: Endoscopy;  Laterality: N/A;  . Colonoscopy  12/03/2011  Procedure: COLONOSCOPY;  Surgeon: Louis Meckel, MD;  Location: [facility];  Service: Endoscopy;  Laterality: N/A;  . Colonoscopy  12/04/2011    Procedure: COLONOSCOPY;  Surgeon: Rob Bunting, MD;  Location: [facility];  Service: Endoscopy;  Laterality: N/A;  . Colonoscopy  11/06/2012    Procedure: COLONOSCOPY;  Surgeon: Rachael Fee, MD;  Location: [facility];  Service: Endoscopy;  Laterality: N/A;  . Colonoscopy N/A 02/12/2013    Procedure: COLONOSCOPY;  Surgeon: Rachael Fee, MD;  Location: [facility];  Service: Endoscopy;  Laterality: N/A;  . Hot hemostasis N/A 02/12/2013    Procedure: HOT HEMOSTASIS (ARGON PLASMA COAGULATION/BICAP);  Surgeon: Rachael Fee, MD;  Location: [facility];  Service: Endoscopy;   Laterality: N/A;  . Cardiac catheterization  1994    normal coronary arteries, EF 40%    Allergies  Allergen Reactions  . Amiodarone     Elevated LFTS  . Meperidine Hcl Nausea And Vomiting    REACTION: nausea: VOMIT  . Morphine Sulfate Nausea And Vomiting    REACTION: nausea: "VOMIT"    Current Outpatient Prescriptions  Medication Sig Dispense Refill  . cholecalciferol (VITAMIN D) 1000 UNITS tablet Take 1,000 Units by mouth daily.      . cyanocobalamin (,VITAMIN B-12,) 1000 MCG/ML injection Inject 1,000 mcg into the muscle every 3 (three) months.      . cycloSPORINE (RESTASIS) 0.05 % ophthalmic emulsion Place 1 drop into both eyes 2 (two) times daily.        . digoxin (LANOXIN) 0.125 MG tablet TAKE 1 TABLET BY MOUTH EVERY DAY  90 tablet  2  . furosemide (LASIX) 20 MG tablet Take 20 mg by mouth daily.      Marland Kitchen HYDROcodone-acetaminophen (VICODIN) 5-500 MG per tablet TAKE 1 TABLET BY MOUTH EVERY 4 TO 6 HOURS AS NEEDED FOR PAIN  20 tablet  0  . lidocaine (LIDODERM) 5 % Place 1 patch onto the skin as needed. Remove & Discard patch within 12 hours or as directed by MD      . Lutein 20 MG TABS Take 1 tablet by mouth daily.        . methocarbamol (ROBAXIN) 500 MG tablet Take 1 tablet (500 mg total) by mouth 3 (three) times daily.  30 tablet  0  . nebivolol (BYSTOLIC) 2.5 MG tablet Take 1 tablet (2.5 mg total) by mouth daily.  30 tablet  6  . omeprazole-sodium bicarbonate (ZEGERID) 40-1100 MG per capsule Take 1 capsule by mouth daily before breakfast.  20 capsule  0  . Pancrelipase, Lip-Prot-Amyl, (CREON) 24000 UNITS CPEP Take by mouth. Take 1 tablet by mouth three times a day ac meals and snacks       . traMADol (ULTRAM) 50 MG tablet Take 1 tab every 4-6 hours as needed for pain.  40 tablet  0  . warfarin (COUMADIN) 2 MG tablet Per INR       Current Facility-Administered Medications  Medication Dose Route Frequency Provider Last Rate Last Dose  . cyanocobalamin ((VITAMIN B-12)) injection  1,000 mcg  1,000 mcg Intramuscular Q30 days Iva Boop, MD   1,000 mcg at 12/24/12 1350    Socially she is widowed. She has 3 children 4 grandchildren. There is no tobacco or alcohol use.  ROS is negative for fevers, chills or night sweats.  She denies skin rash. She denies bleeding. There is no change in hearing. She is unaware lymphadenopathy. She denies PND orthopnea. There is no presyncope or syncope. She  denies chest pressure. She does have a history of GERD. She denies recent black stool. She denies any awareness of blood in her urine. At times there is some mild back discomfort. She denies significant leg swelling she does use support stockings. She did has been having difficulty with her back secondary to scoliosis. Recently was prescribed a muscle relaxant. She denies neurologic symptoms. There are no seizures. Other comprehensive 14 point system review is negative.  PE BP 130/86  Pulse 80  Ht 5\' 7"  (1.702 m)  Wt 82.01 kg (180 lb 12.8 oz)  BMI 28.31 kg/m2  General: Alert, oriented, no distress.  Skin: normal turgor, no rashes HEENT: Normocephalic, atraumatic. Pupils round and reactive; sclera anicteric;no lid lag.  Nose without nasal septal hypertrophy Mouth/Parynx benign; Mallinpatti scale 2/3 Neck: No JVD, no carotid briuts Back: Mild tenderness on the right near the rib cage. This did appear to be more musculoskeletal spasm. Lungs: clear to ausculatation and percussion; no wheezing or rales Heart: Irregularly irregular with an occasional ectopic complex;  s1 s2 normal  Abdomen: soft, nontender; no hepatosplenomehaly, BS+; abdominal aorta nontender and not dilated by palpation. Pulses 2+ Extremities: no clubbing cyanosis or edema, Homan's sign negative  Neurologic: grossly nonfocal Psychological: Normal affect and mood appear  ECG: Atrial fibrillation at 80 beats per minute. She does have left bundle branch block. No ectopy today. LABS:  BMET    Component Value  Date/Time   NA 142 10/27/2013 1448   K 3.9 10/27/2013 1448   CL 105 10/27/2013 1448   CO2 26 10/27/2013 1448   GLUCOSE 119* 10/27/2013 1448   BUN 16 10/27/2013 1448   CREATININE 0.9 10/27/2013 1448   CREATININE 1.00 06/17/2013 1425   CALCIUM 9.3 10/27/2013 1448   GFRNONAA 73* 11/05/2012 0454   GFRAA 84* 11/05/2012 0454     Hepatic Function Panel     Component Value Date/Time   PROT 6.5 10/27/2013 1448   ALBUMIN 3.9 10/27/2013 1448   AST 16 10/27/2013 1448   ALT 16 10/27/2013 1448   ALKPHOS 62 10/27/2013 1448   BILITOT 0.8 10/27/2013 1448     CBC    Component Value Date/Time   WBC 4.8 10/27/2013 1448   RBC 4.90 10/27/2013 1448   HGB 11.9* 10/27/2013 1448   HCT 37.7 10/27/2013 1448   PLT 285.0 10/27/2013 1448   MCV 76.9* 10/27/2013 1448   MCH 22.8* 06/17/2013 1425   MCHC 31.6 10/27/2013 1448   RDW 18.1* 10/27/2013 1448   LYMPHSABS 0.9 10/27/2013 1448   MONOABS 0.5 10/27/2013 1448   EOSABS 0.0 10/27/2013 1448   BASOSABS 0.0 10/27/2013 1448     BNP    Component Value Date/Time   PROBNP 5261.0* 12/18/2011 1306    Lipid Panel  No results found for this basename: chol,  trig,  hdl,  cholhdl,  vldl,  ldlcalc     RADIOLOGY: No results found.    ASSESSMENT AND PLAN: [name] has permanent atrial fibrillation interventricular rate is controlled on current therapy including bystolic and digoxin.  Her leg swelling did improve with the increased furosemide initially. She now has been taking is only 20 mg daily without recurrence but does use support stockings. She is bothered by probable musculoskeletal back discomfort which may be contributed by her scoliosis. She did have her INR checked today. She's not having any signs of heart failure. There is no bleeding. She is well compensated. Her last echo Doppler showed almost normalization of LV function with an  EF of 50-55% which was noted in February 2013. Though she remains stable, we'll see her in 6 months for further  evaluation.   Lennette Bihari, MD, [facility]  11/22/2013 4:25 PM

## 2013-12-01 ENCOUNTER — Other Ambulatory Visit: Payer: Self-pay | Admitting: Physician Assistant

## 2013-12-02 ENCOUNTER — Other Ambulatory Visit: Payer: Self-pay | Admitting: Physician Assistant

## 2013-12-03 ENCOUNTER — Ambulatory Visit (INDEPENDENT_AMBULATORY_CARE_PROVIDER_SITE_OTHER): Payer: Medicare Other | Admitting: Pharmacist Clinician (PhC)/ Clinical Pharmacy Specialist

## 2013-12-03 VITALS — BP 120/60 | HR 68

## 2013-12-03 DIAGNOSIS — I4891 Unspecified atrial fibrillation: Secondary | ICD-10-CM

## 2013-12-03 DIAGNOSIS — Z7901 Long term (current) use of anticoagulants: Secondary | ICD-10-CM

## 2013-12-05 ENCOUNTER — Telehealth: Payer: Self-pay | Admitting: *Deleted

## 2013-12-05 MED ORDER — TRAMADOL HCL 50 MG PO TABS: ORAL_TABLET | ORAL | Status: DC

## 2013-12-05 NOTE — Telephone Encounter (Signed)
Prescription faxed to [facility] [address]

## 2013-12-05 NOTE — Telephone Encounter (Signed)
Prescription for Ultram / Tramadol faxed to pharmacy.

## 2013-12-24 ENCOUNTER — Ambulatory Visit (INDEPENDENT_AMBULATORY_CARE_PROVIDER_SITE_OTHER): Payer: Medicare Other | Admitting: Pharmacist Clinician (PhC)/ Clinical Pharmacy Specialist

## 2013-12-24 VITALS — BP 136/70 | HR 60

## 2013-12-24 DIAGNOSIS — I4891 Unspecified atrial fibrillation: Secondary | ICD-10-CM

## 2013-12-24 DIAGNOSIS — Z7901 Long term (current) use of anticoagulants: Secondary | ICD-10-CM

## 2013-12-24 LAB — POCT INR: INR: 2.9

## 2013-12-24 MED ORDER — NEBIVOLOL HCL 5 MG PO TABS: 5.0000 mg | ORAL_TABLET | Freq: Every day | ORAL | Status: DC

## 2014-01-19 ENCOUNTER — Other Ambulatory Visit: Payer: Self-pay | Admitting: Cardiovascular Disease

## 2014-01-21 ENCOUNTER — Ambulatory Visit (INDEPENDENT_AMBULATORY_CARE_PROVIDER_SITE_OTHER): Payer: Medicare Other | Admitting: Pharmacist Clinician (PhC)/ Clinical Pharmacy Specialist

## 2014-01-21 VITALS — BP 128/70 | HR 72

## 2014-01-21 DIAGNOSIS — I4891 Unspecified atrial fibrillation: Secondary | ICD-10-CM

## 2014-01-21 DIAGNOSIS — Z7901 Long term (current) use of anticoagulants: Secondary | ICD-10-CM

## 2014-01-21 LAB — POCT INR: INR: 1.9

## 2014-01-22 ENCOUNTER — Emergency Department (HOSPITAL_COMMUNITY)
Admission: EM | Admit: 2014-01-22 | Discharge: 2014-01-23 | Disposition: A | Discharge status: Home / Self Care | Payer: Medicare Other | Attending: Emergency Medicine | Admitting: Emergency Medicine

## 2014-01-22 ENCOUNTER — Emergency Department (HOSPITAL_COMMUNITY): Payer: Medicare Other

## 2014-01-22 ENCOUNTER — Encounter (HOSPITAL_COMMUNITY): Payer: Self-pay | Admitting: Emergency Medicine

## 2014-01-22 DIAGNOSIS — Y92009 Unspecified place in unspecified non-institutional (private) residence as the place of occurrence of the external cause: Secondary | ICD-10-CM | POA: Insufficient documentation

## 2014-01-22 DIAGNOSIS — S60221A Contusion of right hand, initial encounter: Secondary | ICD-10-CM

## 2014-01-22 DIAGNOSIS — W19XXXA Unspecified fall, initial encounter: Secondary | ICD-10-CM

## 2014-01-22 DIAGNOSIS — S60229A Contusion of unspecified hand, initial encounter: Secondary | ICD-10-CM | POA: Insufficient documentation

## 2014-01-22 DIAGNOSIS — Z86711 Personal history of pulmonary embolism: Secondary | ICD-10-CM | POA: Insufficient documentation

## 2014-01-22 DIAGNOSIS — S92309A Fracture of unspecified metatarsal bone(s), unspecified foot, initial encounter for closed fracture: Secondary | ICD-10-CM | POA: Insufficient documentation

## 2014-01-22 DIAGNOSIS — D509 Iron deficiency anemia, unspecified: Secondary | ICD-10-CM | POA: Insufficient documentation

## 2014-01-22 DIAGNOSIS — M1990 Unspecified osteoarthritis, unspecified site: Secondary | ICD-10-CM | POA: Insufficient documentation

## 2014-01-22 DIAGNOSIS — I4891 Unspecified atrial fibrillation: Secondary | ICD-10-CM | POA: Insufficient documentation

## 2014-01-22 DIAGNOSIS — X500XXA Overexertion from strenuous movement or load, initial encounter: Secondary | ICD-10-CM | POA: Insufficient documentation

## 2014-01-22 DIAGNOSIS — Z8711 Personal history of peptic ulcer disease: Secondary | ICD-10-CM | POA: Insufficient documentation

## 2014-01-22 DIAGNOSIS — S99199A Other physeal fracture of unspecified metatarsal, initial encounter for closed fracture: Secondary | ICD-10-CM

## 2014-01-22 DIAGNOSIS — Z79899 Other long term (current) drug therapy: Secondary | ICD-10-CM | POA: Insufficient documentation

## 2014-01-22 DIAGNOSIS — K8689 Other specified diseases of pancreas: Secondary | ICD-10-CM | POA: Insufficient documentation

## 2014-01-22 DIAGNOSIS — Y9389 Activity, other specified: Secondary | ICD-10-CM | POA: Insufficient documentation

## 2014-01-22 DIAGNOSIS — Z7901 Long term (current) use of anticoagulants: Secondary | ICD-10-CM | POA: Insufficient documentation

## 2014-01-22 DIAGNOSIS — W010XXA Fall on same level from slipping, tripping and stumbling without subsequent striking against object, initial encounter: Secondary | ICD-10-CM | POA: Insufficient documentation

## 2014-01-22 DIAGNOSIS — E538 Deficiency of other specified B group vitamins: Secondary | ICD-10-CM | POA: Insufficient documentation

## 2014-01-22 NOTE — ED Notes (Signed)
Pt's daughter states that she was having some numbness in her feet which is not abnormal, and fell.  Pt now complaining of pain in her right ankle and hand.

## 2014-01-23 MED ORDER — TRAMADOL HCL 50 MG PO TABS: 50.0000 mg | ORAL_TABLET | Freq: Four times a day (QID) | ORAL | Status: DC | PRN

## 2014-01-23 MED ORDER — HYDROCODONE-ACETAMINOPHEN 5-325 MG PO TABS: 1.0000 | ORAL_TABLET | Freq: Once | ORAL | Status: DC

## 2014-01-23 MED ORDER — TRAMADOL HCL 50 MG PO TABS
50.0000 mg | ORAL_TABLET | Freq: Once | ORAL | Status: AC
  Administered 2014-01-23: 50 mg via ORAL
  Filled 2014-01-23: qty 1

## 2014-01-23 MED ORDER — HYDROCODONE-ACETAMINOPHEN 5-325 MG PO TABS: 12.0000 | ORAL_TABLET | ORAL | Status: DC | PRN

## 2014-01-23 NOTE — ED Provider Notes (Signed)
CSN: [id number]     Arrival date & time 01/22/14  2048 History   First MD Initiated Contact with Patient 01/22/14 2358     Chief Complaint  Patient presents with  . Ankle Pain  . Hand Pain   (Consider location/radiation/quality/duration/timing/severity/associated sxs/prior Treatment) HPI  Patient is an 80 yo woman who is anticoagulated with Coumadin for chronic AF and history of pulmonary emboli. She lives alone and ambulates without assistance. She presents from home after a fall from standing. The patient tripped, lost her balance, rolled her right ankle and attempted to stop her fall with her right hand. She denies head trauma and LOC. She denies headache. No behavioral changes, per family members.   Patient has aching, moderately severe right ankle and lateral right foot pain along which is aching in nature, nonradiating and worse with attempts to bear weight. She also has mild to moderate aching pain over the right hand. Denies pain or trauma to any other region.   Past Medical History  Diagnosis Date  . Abnormal liver function test     with amiodarone  . IBS (irritable bowel syndrome)   . Pancreatic insufficiency     ? mass on imaging notborne out on follow-up  . Gastric ulcer   . Erosive gastritis 7/10  . Vitamin B12 deficiency   . Iron deficiency anemia   . Compression fracture of C-spine   . Pulmonary embolism 2013    bilateral   . Diverticulosis   . Internal hemorrhoids   . Anemia     post hemorragic  . GI bleed   . Fibromyalgia   . Atrial fibrillation, chronic   . Osteoarthritis     back and lower extremities, neck  . LBBB (left bundle branch block)   . Nonischemic cardiomyopathy     Hx of-1998 EF 40%, improved to 50-55%  . Anticoagulated on Coumadin     for atrial fib  . Echocardiogram abnormal 02/13/2012    EF 50-55%, LA severely dilated, mild MR, mild TR, rvSYST. PRESSURE 30-40  . H/O cardiovascular stress test 03/30/2011    no ischemia, no change from  previuos scan  . SSS (sick sinus syndrome) 2012    mild, lowest HR wth biowatch, 43.  fastest 110   Past Surgical History  Procedure Laterality Date  . Tubal ligation    . Back surgery    . Left knee surgery    . Hernia repair    . Carpal tunnel release      right  . Wrist surgery      right plate  . Bladder surgery      tact  . Ovarian cyst removal    . Esophagogastroduodenoscopy  06/2009    erosive gastritis, small ulcer, hiatal hernia (negative biopsies)  . Colonoscopy  08/2009    diverticulosis, internal hemorrhoids  . Upper gastrointestinal endoscopy  05/30/11    normal  . Colonoscopy  06/02/11    Diverticulosis  . Capsule endoscopy  06/04/11    normal  . Esophagogastroduodenoscopy  11/29/2011    Procedure: ESOPHAGOGASTRODUODENOSCOPY (EGD);  Surgeon: Inda Castle, MD;  Location: [facility];  Service: Endoscopy;  Laterality: N/A;  . Givens capsule study  11/29/2011    Procedure: GIVENS CAPSULE STUDY;  Surgeon: Inda Castle, MD;  Location: [facility];  Service: Endoscopy;  Laterality: N/A;  . Enteroscopy  12/02/2011    Procedure: ENTEROSCOPY;  Surgeon: Inda Castle, MD;  Location: [facility];  Service: Endoscopy;  Laterality: N/A;  .  Colonoscopy  12/03/2011    Procedure: COLONOSCOPY;  Surgeon: Inda Castle, MD;  Location: [facility];  Service: Endoscopy;  Laterality: N/A;  . Colonoscopy  12/04/2011    Procedure: COLONOSCOPY;  Surgeon: Owens Loffler, MD;  Location: [facility];  Service: Endoscopy;  Laterality: N/A;  . Colonoscopy  11/06/2012    Procedure: COLONOSCOPY;  Surgeon: Milus Banister, MD;  Location: [facility];  Service: Endoscopy;  Laterality: N/A;  . Colonoscopy N/A 02/12/2013    Procedure: COLONOSCOPY;  Surgeon: Milus Banister, MD;  Location: [facility];  Service: Endoscopy;  Laterality: N/A;  . Hot hemostasis N/A 02/12/2013    Procedure: HOT HEMOSTASIS (ARGON PLASMA COAGULATION/BICAP);  Surgeon: Milus Banister, MD;  Location: [facility];  Service: Endoscopy;  Laterality: N/A;  . Cardiac catheterization  1994    normal coronary arteries, EF 40%   Family History  Problem Relation Age of Onset  . Heart disease Mother   . Stroke Mother   . Anesthesia problems Neg Hx   . Malignant hyperthermia Neg Hx   . Colon cancer Neg Hx   . Stroke Father   . Cancer Brother   . Cancer Daughter    History  Substance Use Topics  . Smoking status: Never Smoker   . Smokeless tobacco: Never Used  . Alcohol Use: Yes     Comment: glass of wine occasionaly   OB History   Grav Para Term Preterm Abortions TAB SAB Ect Mult Living                 Review of Systems Ten point review of symptoms performed and is negative with the exception of symptoms noted above.   Allergies  Amiodarone; Meperidine hcl; and Morphine sulfate  Home Medications   Current Outpatient Rx  Name  Route  Sig  Dispense  Refill  . cholecalciferol (VITAMIN D) 1000 UNITS tablet   Oral   Take 1,000 Units by mouth daily.         . cyanocobalamin (,VITAMIN B-12,) 1000 MCG/ML injection   Intramuscular   Inject 1,000 mcg into the muscle every 3 (three) months.         . cycloSPORINE (RESTASIS) 0.05 % ophthalmic emulsion   Both Eyes   Place 1 drop into both eyes 2 (two) times daily.           . digoxin (LANOXIN) 0.125 MG tablet      TAKE 1 TABLET BY MOUTH EVERY DAY   90 tablet   2   . furosemide (LASIX) 20 MG tablet   Oral   Take 20 mg by mouth daily.         Marland Kitchen lidocaine (LIDODERM) 5 %   Transdermal   Place 1 patch onto the skin as needed. Remove & Discard patch within 12 hours or as directed by MD         . Lutein 20 MG TABS   Oral   Take 1 tablet by mouth daily.           . nebivolol (BYSTOLIC) 2.5 MG tablet   Oral   Take 1 tablet (2.5 mg total) by mouth daily.   30 tablet   6   . Pancrelipase, Lip-Prot-Amyl, (CREON) 24000 UNITS CPEP   Oral   Take by mouth. Take 1 tablet by mouth three times a day ac meals and snacks           . traMADol (ULTRAM) 50 MG tablet  Take 1 tab every 4-6 hours as needed for pain.   40 tablet   0   . warfarin (COUMADIN) 2 MG tablet   Oral   Take 1-2 mg by mouth See admin instructions. Patient takes 2 mg every day except on Monday patient takes 1 mg          BP 122/83  Pulse 65  Temp(Src) 98.1 F (36.7 C) (Oral)  Resp 14  SpO2 96% Physical Exam Gen: well developed and well nourished appearing, no acute distress Head: NCAT Eyes: PERL, EOMI Nose: no epistaixis or rhinorrhea Mouth/throat: mucosa is moist and pink Neck: cervical spine is nontender Lungs: CTA B, no wheezing, rhonchi or rales CV: RRR, no murmur, extremities appear well perfused.  Abd: soft, notender, nondistended Back: no midline ttp, kyphosis is appreciated.  Skin: warm and dry Ext: RUE: echymosis with hematoma over the dorsal aspect of right 2nd metacarpal, mild ttp. Remainder of the RUE is normal to inspection and nontender with good ROM at the shoulder, elbow and wrist. Same on left UE with normal hand. LLE:  normal to inspection without ttp and with good and painless ROM both hips, and knees, left ankle.  Right ankle - mild sts over the lateral malleolus with ttp here, ttp over the base of the 5th metatarsal.  Good DP pulses bilaterally and cap refill < 2s.  Neuro: CN ii-xii grossly intact, no focal deficits Psyche; normal affect,  calm and cooperative.   ED Course  Procedures (including critical care time) Labs Review Labs Reviewed - No data to display Imaging Review Dg Ankle Complete Right  01/22/2014   CLINICAL DATA:  Pain status post trauma  EXAM: RIGHT ANKLE - COMPLETE 3+ VIEW  COMPARISON:  None.  FINDINGS: The bones are osteopenic. An oblique lucency projects along the base of the fifth metatarsal concerning for nondisplaced fracture. Further evaluation with a dedicated foot series is recommended. There also findings of irregular lucency along the base of the second metatarsal. This may  represent a Mach line though a nondisplaced fracture within this area cannot be excluded. Soft tissue swelling is appreciated within the lateral malleolar region. There does not appear to be evidence of an acute fracture within the ankle proper. Small corticated osseous densities project distal to the medial and lateral malleolar regions possibly representing sequela of prior chronic fractures. Punctate soft tissue calcifications are identified within the soft tissues of the distal lower extremity. There are areas of subchondral sclerosis and hypertrophic spurring within the tarsals.  IMPRESSION: Findings concerning for a nondisplaced fracture along the base of the fifth metatarsal further evaluation with a dedicated foot series is recommended. Mach line versus a nondisplaced fracture along the second metatarsal this finding can be further evaluated with a dedicated foot series. There is no evidence of acute appearing fracture within the ankle proper. There are findings which may represent soft tissue injury. Osteoarthritic changes identified within the ankle.   Electronically Signed   By: Margaree Mackintosh M.D.   On: 01/22/2014 22:12   Dg Hand Complete Right  01/22/2014   CLINICAL DATA:  Fall.  Hand pain.  EXAM: RIGHT HAND - COMPLETE 3+ VIEW  COMPARISON:  None.  FINDINGS: There is been previous open reduction internal fixation of distal radius and ulnar fractures. Advanced posttraumatic osteoarthritis of the wrist, with scapholunate dissociation and proximal migration of the capitate. No evidence of acute fracture. No visible hardware complication.  IMPRESSION: 1. No acute osseous findings. 2. Remote distal forearm fracture,  status post ORIF. No hardware complications. 3. Scapholunate advanced collapse.   Electronically Signed   By: Jorje Guild M.D.   On: 01/22/2014 22:09      MDM  Patient with right sided Jone's fracture.   Case discussed with Dr. Ninfa Linden - Ortho on call. He recommends post op shoe and  weight bearing as tolerated. Patient unable to bear weight using post op shoe. She has a rolling walker at home. I have suggested admission as an alternative to d/c as the patient lives alone and is unable to perform ADLS in current state. However, her daughter says that she will stay with the patient, prepare meals and assist to toilet at bedside commode. I have recommended ice and elevation along with Tramadol or Vicodin for pain. F/U with Dr. Renard Hamper man on Monday.   Elyn Peers, MD 01/23/14 [phone]

## 2014-01-23 NOTE — ED Notes (Signed)
MD at bedside. 

## 2014-01-23 NOTE — ED Notes (Signed)
RN and NT attempted to ambulate patient with walker. Pt unsuccessfully able to bear weight on R ankle. MD notified.

## 2014-01-26 NOTE — Progress Notes (Signed)
[facility] ED CM received call from Emelia Loron in Patient Experience stating that patient was discharged from [facility]  ED 01/23/14 at [place]. Pt reports no f/u for  [place] as per Sharee Pimple. Patient contacted,she states that [facility]  ED RN arranged [place] needs. Pt states, she called [facility] to check on assessment visit, and [facility] reported not having orders. Apologized sincerely, and offered to set up [facility] services, pt verbalized she no longer wants the services. Daughter is cargiver at home with patient  She states, she will follow up with her PCP. No further ED CM needs identified.

## 2014-02-18 ENCOUNTER — Ambulatory Visit (INDEPENDENT_AMBULATORY_CARE_PROVIDER_SITE_OTHER): Payer: Medicare Other | Admitting: Pharmacist Clinician (PhC)/ Clinical Pharmacy Specialist

## 2014-02-18 DIAGNOSIS — Z7901 Long term (current) use of anticoagulants: Secondary | ICD-10-CM

## 2014-02-18 DIAGNOSIS — I4891 Unspecified atrial fibrillation: Secondary | ICD-10-CM

## 2014-02-18 LAB — POCT INR: INR: 2.2

## 2014-03-18 ENCOUNTER — Ambulatory Visit (INDEPENDENT_AMBULATORY_CARE_PROVIDER_SITE_OTHER): Payer: Medicare Other | Admitting: Pharmacist Clinician (PhC)/ Clinical Pharmacy Specialist

## 2014-03-18 DIAGNOSIS — I4891 Unspecified atrial fibrillation: Secondary | ICD-10-CM

## 2014-03-18 DIAGNOSIS — Z7901 Long term (current) use of anticoagulants: Secondary | ICD-10-CM

## 2014-03-18 LAB — POCT INR: INR: 3

## 2014-04-15 ENCOUNTER — Ambulatory Visit: Payer: Medicare Other | Admitting: Pharmacist Clinician (PhC)/ Clinical Pharmacy Specialist

## 2014-04-17 ENCOUNTER — Ambulatory Visit (INDEPENDENT_AMBULATORY_CARE_PROVIDER_SITE_OTHER): Payer: Medicare Other | Admitting: Pharmacist Clinician (PhC)/ Clinical Pharmacy Specialist

## 2014-04-17 DIAGNOSIS — Z7901 Long term (current) use of anticoagulants: Secondary | ICD-10-CM

## 2014-04-17 DIAGNOSIS — I4891 Unspecified atrial fibrillation: Secondary | ICD-10-CM

## 2014-04-17 LAB — POCT INR: INR: 2.4

## 2014-05-29 ENCOUNTER — Ambulatory Visit (INDEPENDENT_AMBULATORY_CARE_PROVIDER_SITE_OTHER): Payer: Medicare Other | Admitting: Cardiovascular Disease

## 2014-05-29 ENCOUNTER — Encounter: Payer: Self-pay | Admitting: Cardiovascular Disease

## 2014-05-29 ENCOUNTER — Ambulatory Visit (INDEPENDENT_AMBULATORY_CARE_PROVIDER_SITE_OTHER): Payer: Medicare Other

## 2014-05-29 VITALS — BP 110/62 | HR 76 | Ht 65.0 in | Wt 178.7 lb

## 2014-05-29 DIAGNOSIS — R600 Localized edema: Secondary | ICD-10-CM

## 2014-05-29 DIAGNOSIS — Z7901 Long term (current) use of anticoagulants: Secondary | ICD-10-CM

## 2014-05-29 DIAGNOSIS — R5383 Other fatigue: Secondary | ICD-10-CM

## 2014-05-29 DIAGNOSIS — I447 Left bundle-branch block, unspecified: Secondary | ICD-10-CM

## 2014-05-29 DIAGNOSIS — I4891 Unspecified atrial fibrillation: Secondary | ICD-10-CM

## 2014-05-29 DIAGNOSIS — E782 Mixed hyperlipidemia: Secondary | ICD-10-CM

## 2014-05-29 DIAGNOSIS — D689 Coagulation defect, unspecified: Secondary | ICD-10-CM

## 2014-05-29 DIAGNOSIS — Z79899 Other long term (current) drug therapy: Secondary | ICD-10-CM

## 2014-05-29 DIAGNOSIS — R0602 Shortness of breath: Secondary | ICD-10-CM

## 2014-05-29 DIAGNOSIS — R5381 Other malaise: Secondary | ICD-10-CM

## 2014-05-29 DIAGNOSIS — R609 Edema, unspecified: Secondary | ICD-10-CM

## 2014-05-29 NOTE — Patient Instructions (Addendum)
PROTIME INR 3.3 TODAY   HOLD  WARFARIN TODAY AND RESTART REG DOSE TOMORROW  TAKE YOUR LASIX TODAY.  LABS CMP,LIPID, CBC,TSH  Your physician wants you to follow-up in 6 MONTH Dr Claiborne Billings.  You will receive a reminder letter in the mail two months in advance. If you don't receive a letter, please call our office to schedule the follow-up appointment.

## 2014-05-31 ENCOUNTER — Encounter: Payer: Self-pay | Admitting: Cardiovascular Disease

## 2014-05-31 DIAGNOSIS — R600 Localized edema: Secondary | ICD-10-CM | POA: Insufficient documentation

## 2014-05-31 NOTE — Progress Notes (Signed)
Patient ID: [name], female   DOB: [birth year], 80 y.o.   MRN: [id number]      HPI: GLENNETTE Matthews is a 80 y.o. female who has a remote history of a nonischemic cardiomyopathy and permanent atrial fibrillation for which she is on Coumadin anticoagulation. She has left bundle branch block. Additional problems include anemia, pancreatic insufficiency, remote peptic ulcer disease, compression fractures, as well as B12 deficiency. In he past after her Coumadin was stopped she did develop bilateral pulmonary emboli and consequently is on chronic Coumadin therapy.  She does have a history of mild GI bleeding in the past.  She also has a history of leg swelling.  Since I last saw her 7 months ago, she continues to be fatigued and does note being tired.  She notes shortness of breath with activity.  She is not able to be active.  She had broken her right foot and also had an episode of shingles.  She denies recent chest pain.  She denies PND, orthopnea.  She presents for evaluation.  Past Medical History  Diagnosis Date  . Abnormal liver function test     with amiodarone  . IBS (irritable bowel syndrome)   . Pancreatic insufficiency     ? mass on imaging notborne out on follow-up  . Gastric ulcer   . Erosive gastritis 7/10  . Vitamin B12 deficiency   . Iron deficiency anemia   . Compression fracture of C-spine   . Pulmonary embolism 2013    bilateral   . Diverticulosis   . Internal hemorrhoids   . Anemia     post hemorragic  . GI bleed   . Fibromyalgia   . Atrial fibrillation, chronic   . Osteoarthritis     back and lower extremities, neck  . LBBB (left bundle branch block)   . Nonischemic cardiomyopathy     Hx of-1998 EF 40%, improved to 50-55%  . Anticoagulated on Coumadin     for atrial fib  . Echocardiogram abnormal 02/13/2012    EF 50-55%, LA severely dilated, mild MR, mild TR, rvSYST. PRESSURE 30-40  . H/O cardiovascular stress test 03/30/2011    no ischemia, no change  from previuos scan  . SSS (sick sinus syndrome) 2012    mild, lowest HR wth biowatch, 43.  fastest 110    Past Surgical History  Procedure Laterality Date  . Tubal ligation    . Back surgery    . Left knee surgery    . Hernia repair    . Carpal tunnel release      right  . Wrist surgery      right plate  . Bladder surgery      tact  . Ovarian cyst removal    . Esophagogastroduodenoscopy  06/2009    erosive gastritis, small ulcer, hiatal hernia (negative biopsies)  . Colonoscopy  08/2009    diverticulosis, internal hemorrhoids  . Upper gastrointestinal endoscopy  05/30/11    normal  . Colonoscopy  06/02/11    Diverticulosis  . Capsule endoscopy  06/04/11    normal  . Esophagogastroduodenoscopy  11/29/2011    Procedure: ESOPHAGOGASTRODUODENOSCOPY (EGD);  Surgeon: Inda Castle, MD;  Location: [facility];  Service: Endoscopy;  Laterality: N/A;  . Givens capsule study  11/29/2011    Procedure: GIVENS CAPSULE STUDY;  Surgeon: Inda Castle, MD;  Location: [facility];  Service: Endoscopy;  Laterality: N/A;  . Enteroscopy  12/02/2011    Procedure: ENTEROSCOPY;  Surgeon: Herbie Baltimore  Shaaron Adler, MD;  Location: [facility];  Service: Endoscopy;  Laterality: N/A;  . Colonoscopy  12/03/2011    Procedure: COLONOSCOPY;  Surgeon: Inda Castle, MD;  Location: [facility];  Service: Endoscopy;  Laterality: N/A;  . Colonoscopy  12/04/2011    Procedure: COLONOSCOPY;  Surgeon: Owens Loffler, MD;  Location: [facility];  Service: Endoscopy;  Laterality: N/A;  . Colonoscopy  11/06/2012    Procedure: COLONOSCOPY;  Surgeon: Milus Banister, MD;  Location: [facility];  Service: Endoscopy;  Laterality: N/A;  . Colonoscopy N/A 02/12/2013    Procedure: COLONOSCOPY;  Surgeon: Milus Banister, MD;  Location: [facility];  Service: Endoscopy;  Laterality: N/A;  . Hot hemostasis N/A 02/12/2013    Procedure: HOT HEMOSTASIS (ARGON PLASMA COAGULATION/BICAP);  Surgeon: Milus Banister, MD;  Location: [facility];  Service: Endoscopy;  Laterality: N/A;  . Cardiac catheterization  1994    normal coronary arteries, EF 40%    Allergies  Allergen Reactions  . Amiodarone     Elevated LFTS  . Meperidine Hcl Nausea And Vomiting    REACTION: nausea: VOMIT  . Morphine Sulfate Nausea And Vomiting    REACTION: nausea: "VOMIT"    Current Outpatient Prescriptions  Medication Sig Dispense Refill  . cholecalciferol (VITAMIN D) 1000 UNITS tablet Take 1,000 Units by mouth daily.      . cyanocobalamin (,VITAMIN B-12,) 1000 MCG/ML injection Inject 1,000 mcg into the muscle every 3 (three) months.      . cycloSPORINE (RESTASIS) 0.05 % ophthalmic emulsion Place 1 drop into both eyes 2 (two) times daily.        . digoxin (LANOXIN) 0.125 MG tablet TAKE 1 TABLET BY MOUTH EVERY DAY  90 tablet  2  . furosemide (LASIX) 20 MG tablet Take 20 mg by mouth as needed.       Marland Kitchen HYDROcodone-acetaminophen (NORCO/VICODIN) 5-325 MG per tablet Take 1 tablet by mouth every 4 (four) hours as needed.      . lidocaine (LIDODERM) 5 % Place 1 patch onto the skin as needed. Remove & Discard patch within 12 hours or as directed by MD      . Lutein 20 MG TABS Take 1 tablet by mouth daily.        . nebivolol (BYSTOLIC) 2.5 MG tablet Take 1 tablet (2.5 mg total) by mouth daily.  30 tablet  6  . Pancrelipase, Lip-Prot-Amyl, (CREON) 24000 UNITS CPEP Take by mouth. Take 1 tablet by mouth three times a day ac meals and snacks       . traMADol (ULTRAM) 50 MG tablet Take 1 tablet (50 mg total) by mouth every 6 (six) hours as needed.  15 tablet  0  . warfarin (COUMADIN) 2 MG tablet Take 1-2 mg by mouth See admin instructions. Patient takes 2 mg every day except on Monday patient takes 1 mg       Current Facility-Administered Medications  Medication Dose Route Frequency Provider Last Rate Last Dose  . cyanocobalamin ((VITAMIN B-12)) injection 1,000 mcg  1,000 mcg Intramuscular Q30 days Gatha Mayer, MD   1,000 mcg at 12/24/12 1350     Socially she is widowed. She has 3 children 4 grandchildren. There is no tobacco or alcohol use.  ROS General: Negative; No fevers, chills, or night sweats;  HEENT: Negative; No changes in vision or hearing, sinus congestion, difficulty swallowing Pulmonary: Negative; No cough, wheezing, shortness of breath, hemoptysis Cardiovascular: Negative; No chest pain, presyncope, syncope, palpatations Positive for leg  swelling GI: Positive for GERD No nausea, vomiting, diarrhea, or abdominal pain GU: Negative; No dysuria, hematuria, or difficulty voiding Musculoskeletal: Positive for back discomfort contributed by her scoliosis; no myalgias, joint pain, or weakness Hematologic/Oncology: Negative; no easy bruising, bleeding Endocrine: Negative; no heat/cold intolerance; no diabetes Neuro: Negative; no changes in balance, headaches Skin: Negative; No rashes or skin lesions Psychiatric: Negative; No behavioral problems, depression Sleep: Negative; No snoring, daytime sleepiness, hypersomnolence, bruxism, restless legs, hypnogognic hallucinations, no cataplexy Other comprehensive 14 point system review is negative.   PE BP 110/62  Pulse 76  Ht 5\' 5"  (1.651 m)  Wt 178 lb 11.2 oz (81.058 kg)  BMI 29.74 kg/m2  General: Alert, oriented, no distress.  Skin: normal turgor, no rashes HEENT: Normocephalic, atraumatic. Pupils round and reactive; sclera anicteric;no lid lag.  Nose without nasal septal hypertrophy Mouth/Parynx benign; Mallinpatti scale 2/3 Neck: No JVD, no carotid bruits Chest wall: Nontender to palpation Lungs: clear to ausculatation and percussion; no wheezing or rales Heart: Irregularly irregular with an occasional ectopic complex;  s1 s2 normal  Abdomen: soft, nontender; no hepatosplenomehaly, BS+; abdominal aorta nontender and not dilated by palpation. Pulses 2+ Back: No CVA tenderness Extremities: 1-2+ pitting ankle edema bilaterally; no clubbing cyanosis, Homan's sign  negative  Neurologic: grossly nonfocal Psychological: Normal affect and mood   ECG (independently read by me) atrial fibrillation at 76 beats per minute with left branch block and repolarization changes.  QTc interval 474 ms  ECG: Atrial fibrillation at 80 beats per minute. She does have left bundle branch block. No ectopy today.   LABS:     INR today 3.3  BMET    Component Value Date/Time   NA 142 10/27/2013 1448   K 3.9 10/27/2013 1448   CL 105 10/27/2013 1448   CO2 26 10/27/2013 1448   GLUCOSE 119* 10/27/2013 1448   BUN 16 10/27/2013 1448   CREATININE 0.9 10/27/2013 1448   CREATININE 1.00 06/17/2013 1425   CALCIUM 9.3 10/27/2013 1448   GFRNONAA 73* 11/05/2012 0454   GFRAA 84* 11/05/2012 0454     Hepatic Function Panel     Component Value Date/Time   PROT 6.5 10/27/2013 1448   ALBUMIN 3.9 10/27/2013 1448   AST 16 10/27/2013 1448   ALT 16 10/27/2013 1448   ALKPHOS 62 10/27/2013 1448   BILITOT 0.8 10/27/2013 1448     CBC    Component Value Date/Time   WBC 4.8 10/27/2013 1448   RBC 4.90 10/27/2013 1448   HGB 11.9* 10/27/2013 1448   HCT 37.7 10/27/2013 1448   PLT 285.0 10/27/2013 1448   MCV 76.9* 10/27/2013 1448   MCH 22.8* 06/17/2013 1425   MCHC 31.6 10/27/2013 1448   RDW 18.1* 10/27/2013 1448   LYMPHSABS 0.9 10/27/2013 1448   MONOABS 0.5 10/27/2013 1448   EOSABS 0.0 10/27/2013 1448   BASOSABS 0.0 10/27/2013 1448     BNP    Component Value Date/Time   PROBNP 5261.0* 12/18/2011 1306    Lipid Panel  No results found for this basename: chol,  trig,  hdl,  cholhdl,  vldl,  ldlcalc     RADIOLOGY: No results found.    ASSESSMENT AND PLAN: Ms. Teed has permanent atrial fibrillation and currently her ventricular rate is well controlled at 76 beats per minute on the systolic 2.5 mg, as well as Lanoxin 0.125 mg.  She has a history of documented LV dysfunction with ejection fraction in the past.  It had been approximately 40%, but  did improve to  approximately 50%.  She does have 1-2+ pitting edema today.  I am recommending that she take her furosemide more frequently.  She apparently has only rarely been taking this.  She will take a 20 mg pill today, when she goes home and will take 20 mg daily until the peripheral edema resolves and then she may be able to take this every other day.  She was instructed on holding her Coumadin dose today and restart tomorrow.  Complete set of blood work will be obtained.  I will contact her regarding laboratory.  She will contact us if her peripheral edema does not improve.  Otherwise, I will see her in 6 months for followup evaluation. Troy Sine, MD, [facility]  05/31/2014 5:37 PM

## 2014-06-02 ENCOUNTER — Ambulatory Visit (INDEPENDENT_AMBULATORY_CARE_PROVIDER_SITE_OTHER): Payer: Medicare Other | Admitting: Pharmacist Clinician (PhC)/ Clinical Pharmacy Specialist

## 2014-06-02 DIAGNOSIS — Z7901 Long term (current) use of anticoagulants: Secondary | ICD-10-CM

## 2014-06-02 DIAGNOSIS — I4891 Unspecified atrial fibrillation: Secondary | ICD-10-CM

## 2014-06-02 LAB — POCT INR: INR: 3.3

## 2014-06-09 ENCOUNTER — Telehealth: Payer: Self-pay | Admitting: *Deleted

## 2014-06-09 NOTE — Telephone Encounter (Signed)
Left message on name-verified VM asking for clarification of furosemide 20mg . Patient asked to call back and leave information with operator.   Refill request for furosemide 20mg  tablets - take 1 tablet by mouth twice daily Last OV with Dr. Claiborne Billings on June 12 - take 20mg  (1 tablet) as needed.

## 2014-06-10 ENCOUNTER — Telehealth: Payer: Self-pay | Admitting: Cardiovascular Disease

## 2014-06-10 MED ORDER — FUROSEMIDE 20 MG PO TABS: 20.0000 mg | ORAL_TABLET | Freq: Every day | ORAL | Status: AC

## 2014-06-10 NOTE — Telephone Encounter (Signed)
Rx was sent to pharmacy electronically.  Patient call to clarify medication instructions. Patient reports she takes 20mg  lasix QD but occasionally will take an extra tablet (20mg ) in afternoon for swelling.   She reports she has had shortness of breath and edema since last OV with Dr. Claiborne Billings ion June 12.   Will inform Dr. Claiborne Billings.

## 2014-06-10 NOTE — Telephone Encounter (Signed)
Returning your call from yesterday concerning her lasix.

## 2014-06-15 ENCOUNTER — Ambulatory Visit (INDEPENDENT_AMBULATORY_CARE_PROVIDER_SITE_OTHER): Payer: Medicare Other | Admitting: Pharmacist Clinician (PhC)/ Clinical Pharmacy Specialist

## 2014-06-15 DIAGNOSIS — Z7901 Long term (current) use of anticoagulants: Secondary | ICD-10-CM

## 2014-06-15 DIAGNOSIS — I4891 Unspecified atrial fibrillation: Secondary | ICD-10-CM

## 2014-06-15 LAB — LIPID PANEL
Cholesterol: 175 mg/dL (ref 0–200)
HDL: 57 mg/dL (ref >39)
LDL Cholesterol: 100 mg/dL — ABNORMAL HIGH (ref 0–99)
Total CHOL/HDL Ratio: 3.1 Ratio
Triglycerides: 92 mg/dL (ref <150)
VLDL: 18 mg/dL (ref 0–40)

## 2014-06-15 LAB — CBC
HCT: 35 % — ABNORMAL LOW (ref 36.0–46.0)
Hemoglobin: 10.4 g/dL — ABNORMAL LOW (ref 12.0–15.0)
MCH: 21.3 pg — ABNORMAL LOW (ref 26.0–34.0)
MCHC: 29.7 g/dL — ABNORMAL LOW (ref 30.0–36.0)
MCV: 71.6 fL — ABNORMAL LOW (ref 78.0–100.0)
PLATELETS: 386 10*3/uL (ref 150–400)
RBC: 4.89 MIL/uL (ref 3.87–5.11)
RDW: 18.6 % — AB (ref 11.5–15.5)
WBC: 5.4 10*3/uL (ref 4.0–10.5)

## 2014-06-15 LAB — COMPREHENSIVE METABOLIC PANEL
ALBUMIN: 3.7 g/dL (ref 3.5–5.2)
AST: 12 U/L (ref 0–37)
Alkaline Phosphatase: 72 U/L (ref 39–117)
BUN: 11 mg/dL (ref 6–23)
CALCIUM: 8.9 mg/dL (ref 8.4–10.5)
CHLORIDE: 106 meq/L (ref 96–112)
CO2: 25 mEq/L (ref 19–32)
Creat: 0.76 mg/dL (ref 0.50–1.10)
Glucose, Bld: 112 mg/dL — ABNORMAL HIGH (ref 70–99)
POTASSIUM: 4.4 meq/L (ref 3.5–5.3)
Sodium: 144 mEq/L (ref 135–145)
TOTAL PROTEIN: 5.9 g/dL — AB (ref 6.0–8.3)
Total Bilirubin: 0.8 mg/dL (ref 0.2–1.2)

## 2014-06-15 LAB — POCT INR: INR: 4

## 2014-06-15 LAB — TSH: TSH: 2.146 u[IU]/mL (ref 0.350–4.500)

## 2014-06-25 ENCOUNTER — Emergency Department (HOSPITAL_COMMUNITY): Payer: Medicare Other

## 2014-06-25 ENCOUNTER — Telehealth: Payer: Self-pay | Admitting: Cardiovascular Disease

## 2014-06-25 ENCOUNTER — Encounter (HOSPITAL_COMMUNITY): Payer: Self-pay | Admitting: Emergency Medicine

## 2014-06-25 ENCOUNTER — Inpatient Hospital Stay (HOSPITAL_COMMUNITY)
Admission: EM | Admit: 2014-06-25 | Discharge: 2014-06-29 | DRG: 292 | Disposition: A | Discharge status: Home Health | Payer: Medicare Other | Attending: Internal Medicine | Admitting: Internal Medicine

## 2014-06-25 DIAGNOSIS — R791 Abnormal coagulation profile: Secondary | ICD-10-CM | POA: Diagnosis present

## 2014-06-25 DIAGNOSIS — Z7901 Long term (current) use of anticoagulants: Secondary | ICD-10-CM

## 2014-06-25 DIAGNOSIS — K922 Gastrointestinal hemorrhage, unspecified: Secondary | ICD-10-CM

## 2014-06-25 DIAGNOSIS — IMO0001 Reserved for inherently not codable concepts without codable children: Secondary | ICD-10-CM | POA: Diagnosis present

## 2014-06-25 DIAGNOSIS — Z86711 Personal history of pulmonary embolism: Secondary | ICD-10-CM

## 2014-06-25 DIAGNOSIS — E538 Deficiency of other specified B group vitamins: Secondary | ICD-10-CM

## 2014-06-25 DIAGNOSIS — D649 Anemia, unspecified: Secondary | ICD-10-CM

## 2014-06-25 DIAGNOSIS — D509 Iron deficiency anemia, unspecified: Secondary | ICD-10-CM | POA: Diagnosis present

## 2014-06-25 DIAGNOSIS — I079 Rheumatic tricuspid valve disease, unspecified: Secondary | ICD-10-CM | POA: Diagnosis present

## 2014-06-25 DIAGNOSIS — J810 Acute pulmonary edema: Secondary | ICD-10-CM | POA: Diagnosis present

## 2014-06-25 DIAGNOSIS — M1990 Unspecified osteoarthritis, unspecified site: Secondary | ICD-10-CM | POA: Diagnosis present

## 2014-06-25 DIAGNOSIS — K8689 Other specified diseases of pancreas: Secondary | ICD-10-CM

## 2014-06-25 DIAGNOSIS — I509 Heart failure, unspecified: Secondary | ICD-10-CM

## 2014-06-25 DIAGNOSIS — I482 Chronic atrial fibrillation, unspecified: Secondary | ICD-10-CM

## 2014-06-25 DIAGNOSIS — I495 Sick sinus syndrome: Secondary | ICD-10-CM | POA: Diagnosis present

## 2014-06-25 DIAGNOSIS — K589 Irritable bowel syndrome without diarrhea: Secondary | ICD-10-CM

## 2014-06-25 DIAGNOSIS — D6859 Other primary thrombophilia: Secondary | ICD-10-CM

## 2014-06-25 DIAGNOSIS — D500 Iron deficiency anemia secondary to blood loss (chronic): Secondary | ICD-10-CM

## 2014-06-25 DIAGNOSIS — I447 Left bundle-branch block, unspecified: Secondary | ICD-10-CM | POA: Diagnosis present

## 2014-06-25 DIAGNOSIS — I059 Rheumatic mitral valve disease, unspecified: Secondary | ICD-10-CM | POA: Diagnosis present

## 2014-06-25 DIAGNOSIS — R609 Edema, unspecified: Secondary | ICD-10-CM

## 2014-06-25 DIAGNOSIS — I4891 Unspecified atrial fibrillation: Secondary | ICD-10-CM | POA: Diagnosis present

## 2014-06-25 DIAGNOSIS — J441 Chronic obstructive pulmonary disease with (acute) exacerbation: Secondary | ICD-10-CM | POA: Diagnosis present

## 2014-06-25 DIAGNOSIS — I428 Other cardiomyopathies: Secondary | ICD-10-CM | POA: Diagnosis present

## 2014-06-25 DIAGNOSIS — R600 Localized edema: Secondary | ICD-10-CM | POA: Diagnosis present

## 2014-06-25 DIAGNOSIS — I5033 Acute on chronic diastolic (congestive) heart failure: Principal | ICD-10-CM | POA: Diagnosis present

## 2014-06-25 HISTORY — DX: Heart failure, unspecified: I50.9

## 2014-06-25 LAB — BASIC METABOLIC PANEL
Anion gap: 17 — ABNORMAL HIGH (ref 5–15)
BUN: 9 mg/dL (ref 6–23)
CO2: 25 meq/L (ref 19–32)
Calcium: 9 mg/dL (ref 8.4–10.5)
Chloride: 101 mEq/L (ref 96–112)
Creatinine, Ser: 0.74 mg/dL (ref 0.50–1.10)
GFR calc Af Amer: 84 mL/min — ABNORMAL LOW (ref >90)
GFR calc non Af Amer: 73 mL/min — ABNORMAL LOW (ref >90)
GLUCOSE: 106 mg/dL — AB (ref 70–99)
Potassium: 4 mEq/L (ref 3.7–5.3)
Sodium: 143 mEq/L (ref 137–147)

## 2014-06-25 LAB — PRO B NATRIURETIC PEPTIDE: PRO B NATRI PEPTIDE: 5885 pg/mL — AB (ref 0–450)

## 2014-06-25 LAB — CBC
HCT: 32.9 % — ABNORMAL LOW (ref 36.0–46.0)
HEMOGLOBIN: 9.6 g/dL — AB (ref 12.0–15.0)
MCH: 21.3 pg — ABNORMAL LOW (ref 26.0–34.0)
MCHC: 29.2 g/dL — ABNORMAL LOW (ref 30.0–36.0)
MCV: 72.9 fL — ABNORMAL LOW (ref 78.0–100.0)
Platelets: 368 10*3/uL (ref 150–400)
RBC: 4.51 MIL/uL (ref 3.87–5.11)
RDW: 20.2 % — ABNORMAL HIGH (ref 11.5–15.5)
WBC: 6 10*3/uL (ref 4.0–10.5)

## 2014-06-25 LAB — PROTIME-INR
INR: 3.48 — AB (ref 0.00–1.49)
Prothrombin Time: 35 seconds — ABNORMAL HIGH (ref 11.6–15.2)

## 2014-06-25 LAB — I-STAT TROPONIN, ED: Troponin i, poc: 0 ng/mL (ref 0.00–0.08)

## 2014-06-25 LAB — OCCULT BLOOD X 1 CARD TO LAB, STOOL: Fecal Occult Bld: NEGATIVE

## 2014-06-25 LAB — TROPONIN I

## 2014-06-25 LAB — DIGOXIN LEVEL: Digoxin Level: 0.6 ng/mL — ABNORMAL LOW (ref 0.8–2.0)

## 2014-06-25 MED ORDER — SODIUM CHLORIDE 0.9 % IV SOLN: 250.0000 mL | INTRAVENOUS | Status: DC | PRN

## 2014-06-25 MED ORDER — ONDANSETRON HCL 4 MG/2ML IJ SOLN
4.0000 mg | Freq: Once | INTRAMUSCULAR | Status: AC
  Administered 2014-06-25: 4 mg via INTRAVENOUS
  Filled 2014-06-25: qty 2

## 2014-06-25 MED ORDER — LISINOPRIL 2.5 MG PO TABS
2.5000 mg | ORAL_TABLET | Freq: Every day | ORAL | Status: DC
  Administered 2014-06-26 – 2014-06-29 (×4): 2.5 mg via ORAL
  Filled 2014-06-25 (×4): qty 1

## 2014-06-25 MED ORDER — ASPIRIN 81 MG PO CHEW: 324.0000 mg | CHEWABLE_TABLET | Freq: Once | ORAL | Status: DC

## 2014-06-25 MED ORDER — FUROSEMIDE 10 MG/ML IJ SOLN
40.0000 mg | Freq: Two times a day (BID) | INTRAMUSCULAR | Status: DC
  Administered 2014-06-26: 40 mg via INTRAVENOUS
  Filled 2014-06-25 (×2): qty 4

## 2014-06-25 MED ORDER — DIGOXIN 125 MCG PO TABS
0.1250 mg | ORAL_TABLET | Freq: Every day | ORAL | Status: DC
  Administered 2014-06-26 – 2014-06-29 (×4): 0.125 mg via ORAL
  Filled 2014-06-25 (×4): qty 1

## 2014-06-25 MED ORDER — NEBIVOLOL HCL 2.5 MG PO TABS
2.5000 mg | ORAL_TABLET | Freq: Every day | ORAL | Status: DC
  Administered 2014-06-26 – 2014-06-29 (×4): 2.5 mg via ORAL
  Filled 2014-06-25 (×4): qty 1

## 2014-06-25 MED ORDER — CYCLOSPORINE 0.05 % OP EMUL
1.0000 [drp] | Freq: Two times a day (BID) | OPHTHALMIC | Status: DC
  Administered 2014-06-25 – 2014-06-29 (×8): 1 [drp] via OPHTHALMIC
  Filled 2014-06-25 (×9): qty 1

## 2014-06-25 MED ORDER — ALBUTEROL SULFATE (2.5 MG/3ML) 0.083% IN NEBU
5.0000 mg | INHALATION_SOLUTION | Freq: Once | RESPIRATORY_TRACT | Status: AC
  Administered 2014-06-25: 5 mg via RESPIRATORY_TRACT
  Filled 2014-06-25: qty 6

## 2014-06-25 MED ORDER — FAMOTIDINE IN NACL 20-0.9 MG/50ML-% IV SOLN
20.0000 mg | Freq: Once | INTRAVENOUS | Status: AC
  Administered 2014-06-25: 20 mg via INTRAVENOUS
  Filled 2014-06-25: qty 50

## 2014-06-25 MED ORDER — ONDANSETRON HCL 4 MG/2ML IJ SOLN
4.0000 mg | Freq: Four times a day (QID) | INTRAMUSCULAR | Status: DC | PRN
  Administered 2014-06-26 – 2014-06-28 (×2): 4 mg via INTRAVENOUS
  Filled 2014-06-25 (×2): qty 2

## 2014-06-25 MED ORDER — SODIUM CHLORIDE 0.9 % IJ SOLN
3.0000 mL | Freq: Two times a day (BID) | INTRAMUSCULAR | Status: DC
  Administered 2014-06-26 – 2014-06-28 (×6): 3 mL via INTRAVENOUS

## 2014-06-25 MED ORDER — WARFARIN SODIUM 1 MG PO TABS
1.0000 mg | ORAL_TABLET | Freq: Once | ORAL | Status: DC
  Filled 2014-06-25: qty 1

## 2014-06-25 MED ORDER — WARFARIN - PHARMACIST DOSING INPATIENT: Freq: Every day | Status: DC

## 2014-06-25 MED ORDER — SODIUM CHLORIDE 0.9 % IJ SOLN: 3.0000 mL | INTRAMUSCULAR | Status: DC | PRN

## 2014-06-25 MED ORDER — IPRATROPIUM BROMIDE 0.02 % IN SOLN
0.5000 mg | Freq: Once | RESPIRATORY_TRACT | Status: AC
  Administered 2014-06-25: 0.5 mg via RESPIRATORY_TRACT
  Filled 2014-06-25: qty 2.5

## 2014-06-25 MED ORDER — FUROSEMIDE 10 MG/ML IJ SOLN
40.0000 mg | Freq: Once | INTRAMUSCULAR | Status: AC
  Administered 2014-06-25: 40 mg via INTRAVENOUS
  Filled 2014-06-25: qty 4

## 2014-06-25 MED ORDER — ACETAMINOPHEN 325 MG PO TABS: 650.0000 mg | ORAL_TABLET | ORAL | Status: DC | PRN

## 2014-06-25 NOTE — Progress Notes (Signed)
UR completed Siaosi Alter K. Dickie Labarre, RN, BSN, [place], CCM  06/25/2014 11:32 PM

## 2014-06-25 NOTE — ED Notes (Addendum)
While ambulating pt O2 was at 93%

## 2014-06-25 NOTE — ED Provider Notes (Signed)
CSN: [id number]     Arrival date & time 06/25/14  1433 History   First MD Initiated Contact with Patient 06/25/14 1557     Chief Complaint  Patient presents with  . Shortness of Breath     (Consider location/radiation/quality/duration/timing/severity/associated sxs/prior Treatment) The history is provided by the patient and a relative. No language interpreter was used.    [name] is a(n) 80 y.o. female who presents to the emergency department with chief complaint of chest pain and shortness of breath. She has a past medical history of erosive gastritis, previous pulmonary embolism, paroxysmal atrial fibrillation and on long-term anticoagulation, sick sinus syndrome, and pulmonary edema. Patient states that over the past week she has had a cough productive of frothy white sputum. Other past 3 days she has had a feeling of inability to take a full breath. She states that her shortness of breath is worse with exertion. She is unable to get around her house to perform activities of daily living. She has a constant ache in her chest which is worse with coughing. Her shortness of breath became progressively worse today. She continues to have aching in her chest. She denies any radiation to the left jaw left arm, She describes a feeling of pressure. She has noticed worsening of her ankle swelling over the past few days. She c/o of nausea and belching. No vomiting. Denies melena or hematochezia.  Past Medical History  Diagnosis Date  . Abnormal liver function test     with amiodarone  . IBS (irritable bowel syndrome)   . Pancreatic insufficiency     ? mass on imaging notborne out on follow-up  . Gastric ulcer   . Erosive gastritis 7/10  . Vitamin B12 deficiency   . Iron deficiency anemia   . Compression fracture of C-spine   . Pulmonary embolism 2013    bilateral   . Diverticulosis   . Internal hemorrhoids   . Anemia     post hemorragic  . GI bleed   . Fibromyalgia   . Atrial  fibrillation, chronic   . Osteoarthritis     back and lower extremities, neck  . LBBB (left bundle branch block)   . Nonischemic cardiomyopathy     Hx of-1998 EF 40%, improved to 50-55%  . Anticoagulated on Coumadin     for atrial fib  . Echocardiogram abnormal 02/13/2012    EF 50-55%, LA severely dilated, mild MR, mild TR, rvSYST. PRESSURE 30-40  . H/O cardiovascular stress test 03/30/2011    no ischemia, no change from previuos scan  . SSS (sick sinus syndrome) 2012    mild, lowest HR wth biowatch, 43.  fastest 110   Past Surgical History  Procedure Laterality Date  . Tubal ligation    . Back surgery    . Left knee surgery    . Hernia repair    . Carpal tunnel release      right  . Wrist surgery      right plate  . Bladder surgery      tact  . Ovarian cyst removal    . Esophagogastroduodenoscopy  06/2009    erosive gastritis, small ulcer, hiatal hernia (negative biopsies)  . Colonoscopy  08/2009    diverticulosis, internal hemorrhoids  . Upper gastrointestinal endoscopy  05/30/11    normal  . Colonoscopy  06/02/11    Diverticulosis  . Capsule endoscopy  06/04/11    normal  . Esophagogastroduodenoscopy  11/29/2011    Procedure: ESOPHAGOGASTRODUODENOSCOPY (  EGD);  Surgeon: Inda Castle, MD;  Location: [facility];  Service: Endoscopy;  Laterality: N/A;  . Givens capsule study  11/29/2011    Procedure: GIVENS CAPSULE STUDY;  Surgeon: Inda Castle, MD;  Location: [facility];  Service: Endoscopy;  Laterality: N/A;  . Enteroscopy  12/02/2011    Procedure: ENTEROSCOPY;  Surgeon: Inda Castle, MD;  Location: [facility];  Service: Endoscopy;  Laterality: N/A;  . Colonoscopy  12/03/2011    Procedure: COLONOSCOPY;  Surgeon: Inda Castle, MD;  Location: [facility];  Service: Endoscopy;  Laterality: N/A;  . Colonoscopy  12/04/2011    Procedure: COLONOSCOPY;  Surgeon: Owens Loffler, MD;  Location: [facility];  Service: Endoscopy;  Laterality: N/A;  . Colonoscopy   11/06/2012    Procedure: COLONOSCOPY;  Surgeon: Milus Banister, MD;  Location: [facility];  Service: Endoscopy;  Laterality: N/A;  . Colonoscopy N/A 02/12/2013    Procedure: COLONOSCOPY;  Surgeon: Milus Banister, MD;  Location: [facility];  Service: Endoscopy;  Laterality: N/A;  . Hot hemostasis N/A 02/12/2013    Procedure: HOT HEMOSTASIS (ARGON PLASMA COAGULATION/BICAP);  Surgeon: Milus Banister, MD;  Location: [facility];  Service: Endoscopy;  Laterality: N/A;  . Cardiac catheterization  1994    normal coronary arteries, EF 40%   Family History  Problem Relation Age of Onset  . Heart disease Mother   . Stroke Mother   . Anesthesia problems Neg Hx   . Malignant hyperthermia Neg Hx   . Colon cancer Neg Hx   . Stroke Father   . Cancer Brother   . Cancer Daughter    History  Substance Use Topics  . Smoking status: Never Smoker   . Smokeless tobacco: Never Used  . Alcohol Use: Yes     Comment: glass of wine occasionaly   OB History   Grav Para Term Preterm Abortions TAB SAB Ect Mult Living                 Review of Systems  Ten systems reviewed and are negative for acute change, except as noted in the HPI.    Allergies  Amiodarone; Meperidine hcl; and Morphine sulfate  Home Medications   Prior to Admission medications   Medication Sig Start Date End Date Taking? Authorizing Provider  cholecalciferol (VITAMIN D) 1000 UNITS tablet Take 1,000 Units by mouth daily.   Yes Historical Provider, MD  cyanocobalamin (,VITAMIN B-12,) 1000 MCG/ML injection Inject 1,000 mcg into the muscle every 3 (three) months.   Yes Historical Provider, MD  cycloSPORINE (RESTASIS) 0.05 % ophthalmic emulsion Place 1 drop into both eyes 2 (two) times daily.    Yes Historical Provider, MD  digoxin (LANOXIN) 0.125 MG tablet Take by mouth daily.   Yes Historical Provider, MD  furosemide (LASIX) 20 MG tablet Take 1 tablet (20 mg total) by mouth daily. May take extra 1 tablet (20mg ) as needed for  swelling. 06/10/14  Yes Troy Sine, MD  Lutein 20 MG TABS Take 1 tablet by mouth daily.    Yes Historical Provider, MD  nebivolol (BYSTOLIC) 2.5 MG tablet Take 1 tablet (2.5 mg total) by mouth daily. 10/08/13  Yes Troy Sine, MD  warfarin (COUMADIN) 2 MG tablet Take 1-2 mg by mouth See admin instructions. Take 1 mg on Monday and Friday.  Take 2 mg on all other days. 01/20/14  Yes Kristin Alvstad, RPH-CPP   BP 112/58  Pulse 70  Temp(Src) 98.2 F (36.8 C)  Resp  18  SpO2 96% Physical Exam  Constitutional: She is oriented to person, place, and time. She appears well-developed and well-nourished. No distress.  HENT:  Head: Normocephalic and atraumatic.  Eyes: Conjunctivae are normal. No scleral icterus.  Neck: Normal range of motion. Neck supple. No JVD present.  Cardiovascular: Normal rate, regular rhythm and intact distal pulses.  Exam reveals no gallop and no friction rub.   Murmur heard. 2+ pitting edema BL lower extremities  Pulmonary/Chest: No respiratory distress.  Shallow effort. Decreased lung sounds with fine crackles bilaterally.  Abdominal: Soft. Bowel sounds are normal. She exhibits no distension and no mass. There is no tenderness. There is no guarding.  Neurological: She is alert and oriented to person, place, and time.  Skin: Skin is warm and dry. She is not diaphoretic.    ED Course  Procedures (including critical care time) Labs Review Labs Reviewed  CBC - Abnormal; Notable for the following:    Hemoglobin 9.6 (*)    HCT 32.9 (*)    MCV 72.9 (*)    MCH 21.3 (*)    MCHC 29.2 (*)    RDW 20.2 (*)    All other components within normal limits  PROTIME-INR - Abnormal; Notable for the following:    Prothrombin Time 35.0 (*)    INR 3.48 (*)    All other components within normal limits  BASIC METABOLIC PANEL  PRO B NATRIURETIC PEPTIDE  I-STAT TROPOININ, ED    Imaging Review No results found.   EKG Interpretation None      MDM   Final diagnoses:   Acute congestive heart failure, unspecified congestive heart failure type    5:40 PM Patient wit decreased sounds in bases and fine crackles.  Concern for Pulmonary edema. CXR shows mild edema. ? CAP, clinical picture does not suggest this diagnosis, however we are awaiting her lab results. I will begin the patient on Lasix 40 g IV and also IV pepcid for her nausea.  5:51 PM Patient appears to be supratherapeutic on her INR. Hemoglobin 9.6. She appears to have dropped nearly 1 g since her previous hemoglobin one week ago. i will Check a fecal occult stool test.  6:43 PM Patient continues to be SOB. She walked to the bathroom and became very winded. continues to have some nausea and belching.  She is now having slight expiratory wheeze. Will obtain an ambulating pulse ox on the patient. Patient's previous echocardiogram was in 226 of 20 team. Showed left ventricular ejection fraction of 50-55%. She is severely dilated left atrium with mild tricuspid and mitral regurg. The patient had a mixed picture of CHF exacerbation and mild COPD exacerbation. Pro BNP of nearly 6000.  Negative FOBT, Ambulating 02 93% on RA. Patient will be admitted to telemetry by Dr. Eulogio Bear. I personally reviewed the imaging tests through PACS system. I have reviewed and interpreted Lab values. I reviewed available ER/hospitalization records through the EMR   The patient appears reasonably stabilized for admission considering the current resources, flow, and capabilities available in the ED at this time, and I doubt any other [facility] requiring further screening and/or treatment in the ED prior to admission.    Margarita Mail, PA-C 06/26/14 1008

## 2014-06-25 NOTE — ED Notes (Signed)
Called both minilab and main lab.  Neither has receipt of occult blood.

## 2014-06-25 NOTE — ED Notes (Addendum)
PT nauseous at present. And feels like she has a metallic taste in her mouth

## 2014-06-25 NOTE — ED Notes (Signed)
Per pt sts that she has been SOB and having chest pressure. sts also BLE edema. sts some nausea.

## 2014-06-25 NOTE — ED Notes (Signed)
PT reports SOB for a few weeks but worsening the past few days. PT has been coughing up white frothy mucous. PT states discomfort w/ deep inspiration as centralized chest pressure. PT feels like she has to belch a lot and has been getting nausous

## 2014-06-25 NOTE — Telephone Encounter (Signed)
Pt called into triage and stated she was having CP and SOB  I informed pt that she would need to go to the Emergency dept. At [facility] to be evaluated, pt. Stated understanding of instructions, Wannetta Sender called and informed

## 2014-06-25 NOTE — Progress Notes (Signed)
ANTICOAGULATION CONSULT NOTE - Initial Consult  Pharmacy Consult for warfarin Indication: atrial fibrillation  Allergies  Allergen Reactions  . Amiodarone     Elevated LFTS  . Meperidine Hcl Nausea And Vomiting    REACTION: nausea: VOMIT  . Morphine Sulfate Nausea And Vomiting    REACTION: nausea: "VOMIT"    Patient Measurements:   Heparin Dosing Weight:   Vital Signs: Temp: 98.2 F (36.8 C) (07/09 1459) BP: 117/62 mmHg (07/09 2036) Pulse Rate: 63 (07/09 1900)  Labs:  Recent Labs  06/25/14 1639  HGB 9.6*  HCT 32.9*  PLT 368  LABPROT 35.0*  INR 3.48*  CREATININE 0.74    The CrCl is unknown because both a height and weight (above a minimum accepted value) are required for this calculation.   Medical History: Past Medical History  Diagnosis Date  . Abnormal liver function test     with amiodarone  . IBS (irritable bowel syndrome)   . Pancreatic insufficiency     ? mass on imaging notborne out on follow-up  . Gastric ulcer   . Erosive gastritis 7/10  . Vitamin B12 deficiency   . Iron deficiency anemia   . Compression fracture of C-spine   . Pulmonary embolism 2013    bilateral   . Diverticulosis   . Internal hemorrhoids   . Anemia     post hemorragic  . GI bleed   . Fibromyalgia   . Atrial fibrillation, chronic   . Osteoarthritis     back and lower extremities, neck  . LBBB (left bundle branch block)   . Nonischemic cardiomyopathy     Hx of-1998 EF 40%, improved to 50-55%  . Anticoagulated on Coumadin     for atrial fib  . Echocardiogram abnormal 02/13/2012    EF 50-55%, LA severely dilated, mild MR, mild TR, rvSYST. PRESSURE 30-40  . H/O cardiovascular stress test 03/30/2011    no ischemia, no change from previuos scan  . SSS (sick sinus syndrome) 2012    mild, lowest HR wth biowatch, 43.  fastest 110  . CHF (congestive heart failure) 06/25/2014    Medications:  See EMR  Assessment: 80 yo female presents with chest pain, cough and shortness  of breath.  On warfarin prior to admission for atrial fibrillation.  INR on admit is elevated at 3.48, anemia noted, no overt bleeding at this time.  PTA warfarin dose: 1 mg Mon/Fri and 2 mg on all other days  Goal of Therapy:  INR 2-3 Monitor platelets by anticoagulation protocol: Yes    Plan:  -Monitor closely for bleeding d/t elevated INR and mild anemia -Warfarin 1 mg po x1 -Daily INR    Hughes Better, PharmD, BCPS Clinical Pharmacist Pager: [phone] 06/25/2014 9:01 PM

## 2014-06-25 NOTE — H&P (Signed)
[facility] History and Physical  [name] RSW:[id number] DOB: 10-10-1922 DOA: 06/25/2014  Referring physician: er PCP: Lanette Hampshire, MD   Chief Complaint: SOB  HPI: [name] is a 80 y.o. female  Who follows with Dr. Claiborne Billings cardiology. She comes to the ER with a several day history of chest pain and shortness of breath. Patient admits that she has a cough and is coughing up frothy white sputum. She has inability to lay flat. Her shortness of breath is worse with exertion. Chest pain is described as an ache which is worse with coughing. No HN to the jaw or left arm. Patient also admits to some lower extremity swelling of the last few days. Patient's coughing is causing her nausea and vomiting.  Patient was seen by Dr. Claiborne Billings 05/31/2014. She was found at that time to be fatigued and short of breath. She also had pitting edema. Dr. Claiborne Billings recommended that patient take her furosemide it more frequently. Patient has not been taking her first my a daily basis.  In the ER patient was found to have lotion the edema, fluid on chest x-ray, and an elevated BNP. Patient was given a one-time days of IV Lasix. Patient has minimal improvement since that period. hospitalists were asked to admit for CHF exacerbation.   Review of Systems:  All systems reviewed, negative unless stated above   Past Medical History  Diagnosis Date  . Abnormal liver function test     with amiodarone  . IBS (irritable bowel syndrome)   . Pancreatic insufficiency     ? mass on imaging notborne out on follow-up  . Gastric ulcer   . Erosive gastritis 7/10  . Vitamin B12 deficiency   . Iron deficiency anemia   . Compression fracture of C-spine   . Pulmonary embolism 2013    bilateral   . Diverticulosis   . Internal hemorrhoids   . Anemia     post hemorragic  . GI bleed   . Fibromyalgia   . Atrial fibrillation, chronic   . Osteoarthritis     back and lower extremities, neck  . LBBB (left bundle  branch block)   . Nonischemic cardiomyopathy     Hx of-1998 EF 40%, improved to 50-55%  . Anticoagulated on Coumadin     for atrial fib  . Echocardiogram abnormal 02/13/2012    EF 50-55%, LA severely dilated, mild MR, mild TR, rvSYST. PRESSURE 30-40  . H/O cardiovascular stress test 03/30/2011    no ischemia, no change from previuos scan  . SSS (sick sinus syndrome) 2012    mild, lowest HR wth biowatch, 43.  fastest 110  . CHF (congestive heart failure) 06/25/2014   Past Surgical History  Procedure Laterality Date  . Tubal ligation    . Back surgery    . Left knee surgery    . Hernia repair    . Carpal tunnel release      right  . Wrist surgery      right plate  . Bladder surgery      tact  . Ovarian cyst removal    . Esophagogastroduodenoscopy  06/2009    erosive gastritis, small ulcer, hiatal hernia (negative biopsies)  . Colonoscopy  08/2009    diverticulosis, internal hemorrhoids  . Upper gastrointestinal endoscopy  05/30/11    normal  . Colonoscopy  06/02/11    Diverticulosis  . Capsule endoscopy  06/04/11    normal  . Esophagogastroduodenoscopy  11/29/2011    Procedure:  ESOPHAGOGASTRODUODENOSCOPY (EGD);  Surgeon: Inda Castle, MD;  Location: [facility];  Service: Endoscopy;  Laterality: N/A;  . Givens capsule study  11/29/2011    Procedure: GIVENS CAPSULE STUDY;  Surgeon: Inda Castle, MD;  Location: [facility];  Service: Endoscopy;  Laterality: N/A;  . Enteroscopy  12/02/2011    Procedure: ENTEROSCOPY;  Surgeon: Inda Castle, MD;  Location: [facility];  Service: Endoscopy;  Laterality: N/A;  . Colonoscopy  12/03/2011    Procedure: COLONOSCOPY;  Surgeon: Inda Castle, MD;  Location: [facility];  Service: Endoscopy;  Laterality: N/A;  . Colonoscopy  12/04/2011    Procedure: COLONOSCOPY;  Surgeon: Owens Loffler, MD;  Location: [facility];  Service: Endoscopy;  Laterality: N/A;  . Colonoscopy  11/06/2012    Procedure: COLONOSCOPY;  Surgeon: Milus Banister, MD;  Location: [facility];  Service: Endoscopy;  Laterality: N/A;  . Colonoscopy N/A 02/12/2013    Procedure: COLONOSCOPY;  Surgeon: Milus Banister, MD;  Location: [facility];  Service: Endoscopy;  Laterality: N/A;  . Hot hemostasis N/A 02/12/2013    Procedure: HOT HEMOSTASIS (ARGON PLASMA COAGULATION/BICAP);  Surgeon: Milus Banister, MD;  Location: [facility];  Service: Endoscopy;  Laterality: N/A;  . Cardiac catheterization  1994    normal coronary arteries, EF 40%   Social History:  reports that she has never smoked. She has never used smokeless tobacco. She reports that she drinks alcohol. She reports that she does not use illicit drugs.  Allergies  Allergen Reactions  . Amiodarone     Elevated LFTS  . Meperidine Hcl Nausea And Vomiting    REACTION: nausea: VOMIT  . Morphine Sulfate Nausea And Vomiting    REACTION: nausea: "VOMIT"    Family History  Problem Relation Age of Onset  . Heart disease Mother   . Stroke Mother   . Anesthesia problems Neg Hx   . Malignant hyperthermia Neg Hx   . Colon cancer Neg Hx   . Stroke Father   . Cancer Brother   . Cancer Daughter      Prior to Admission medications   Medication Sig Start Date End Date Taking? Authorizing Provider  cholecalciferol (VITAMIN D) 1000 UNITS tablet Take 1,000 Units by mouth daily.   Yes Historical Provider, MD  cyanocobalamin (,VITAMIN B-12,) 1000 MCG/ML injection Inject 1,000 mcg into the muscle every 3 (three) months.   Yes Historical Provider, MD  cycloSPORINE (RESTASIS) 0.05 % ophthalmic emulsion Place 1 drop into both eyes 2 (two) times daily.    Yes Historical Provider, MD  digoxin (LANOXIN) 0.125 MG tablet Take by mouth daily.   Yes Historical Provider, MD  furosemide (LASIX) 20 MG tablet Take 1 tablet (20 mg total) by mouth daily. May take extra 1 tablet (20mg ) as needed for swelling. 06/10/14  Yes Troy Sine, MD  Lutein 20 MG TABS Take 1 tablet by mouth daily.    Yes Historical Provider,  MD  nebivolol (BYSTOLIC) 2.5 MG tablet Take 1 tablet (2.5 mg total) by mouth daily. 10/08/13  Yes Troy Sine, MD  warfarin (COUMADIN) 2 MG tablet Take 1-2 mg by mouth See admin instructions. Take 1 mg on Monday and Friday.  Take 2 mg on all other days. 01/20/14  Yes Tommy Medal, RPH-CPP   Physical Exam: Filed Vitals:   06/25/14 2036  BP: 117/62  Pulse:   Temp:   Resp: 30    BP 117/62  Pulse 63  Temp(Src) 98.2 F (36.8 C)  Resp  30  SpO2 96%  General:  Elderly, NAD Eyes: PERRL, normal lids, irises & conjunctiva ENT: grossly normal hearing, lips & tongue Neck: no LAD, masses or thyromegaly Cardiovascular: irR, no m/r/g. + LE edema b/l Respiratory: no wheezing, crackles B/L. Abdomen: soft, ntnd Skin: no rash or induration seen on limited exam Musculoskeletal: grossly normal tone BUE/BLE Psychiatric: grossly normal mood and affect, speech fluent and appropriate Neurologic: grossly non-focal.          Labs on Admission:  Basic Metabolic Panel:  Recent Labs Lab 06/25/14 1639  NA 143  K 4.0  CL 101  CO2 25  GLUCOSE 106*  BUN 9  CREATININE 0.74  CALCIUM 9.0   Liver Function Tests: No results found for this basename: AST, ALT, ALKPHOS, BILITOT, PROT, ALBUMIN,  in the last 168 hours No results found for this basename: LIPASE, AMYLASE,  in the last 168 hours No results found for this basename: AMMONIA,  in the last 168 hours CBC:  Recent Labs Lab 06/25/14 1639  WBC 6.0  HGB 9.6*  HCT 32.9*  MCV 72.9*  PLT 368   Cardiac Enzymes: No results found for this basename: CKTOTAL, CKMB, CKMBINDEX, TROPONINI,  in the last 168 hours  BNP (last 3 results)  Recent Labs  06/25/14 1639  PROBNP 5885.0*   CBG: No results found for this basename: GLUCAP,  in the last 168 hours  Radiological Exams on Admission: Dg Chest Port 1 View  06/25/2014   CLINICAL DATA:  Shortness of breath with history of atrial fibrillation and pulmonary embolism  EXAM: PORTABLE CHEST - 1  VIEW  COMPARISON:  Chest x-ray of September 19, 2013  FINDINGS: The lungs are adequately inflated. There is partial obscuration of the left hemidiaphragm which is new. The cardiopericardial silhouette remains enlarged. The pulmonary vascularity is engorged. Coarse infrahilar lung markings bilaterally are likely related to this congestion. A small left pleural effusion is suspected.  There is severe thoracolumbar levoscoliosis. There is degenerative change of the right shoulder.  IMPRESSION: Mild CHF superimposed upon COPD. Basilar atelectasis or less likely pneumonia is suspected.   Electronically Signed   By: David  Martinique   On: 06/25/2014 17:24    EKG: Independently reviewed. A fib with LBBB  Assessment/Plan Active Problems:   ATRIAL FIBRILLATION, CHRONIC   Left bundle branch block   Edema, lower extremity   CHF (congestive heart failure)   Hypercoagulopathy   Anemia   SOB due to pulm edema- IV lasix, echo, daily weights, strict I/Os, tele bed  Anemia- monitor for GI bleeding  Atrial fib- check dig level, rate controlled  Increased INR- pharmacy for coumadin dosing   LBBB- old    Code Status: full Family Communication: patient Disposition Plan: admit  Time spent: 75 min  Eulogio Bear [facility] Pager [phone]  **Disclaimer: This note may have been dictated with voice recognition software. Similar sounding words can inadvertently be transcribed and this note may contain transcription errors which may not have been corrected upon publication of note.**

## 2014-06-25 NOTE — ED Notes (Signed)
Attempted to call floor for report.

## 2014-06-25 NOTE — Care Management Note (Addendum)
  Page 2 of 2   06/30/2014     3:51:44 PM CARE MANAGEMENT NOTE 06/30/2014  Patient:  [name], [name]   Account Number:  [id number]  Date Initiated:  06/25/2014  Documentation initiated by:  Destyne Goodreau  Subjective/Objective Assessment:   CHF     Action/Plan:   CM to follow for disposition needs   Anticipated DC Date:  06/28/2014   Anticipated DC Plan:  [place]  CM consult      [facility] Choice  HOME HEALTH   Choice offered to / List presented to:  C-1 Patient        [place] arranged  HH-1 RN  [place] agency  [facility]   Status of service:  Completed, signed off Medicare Important Message given?  YES (If response is "NO", the following Medicare IM given date fields will be blank) Date Medicare IM given:  06/29/2014 Medicare IM given by:  Soha Thorup Date Additional Medicare IM given:   Additional Medicare IM given by:    Discharge Disposition:  [place]  Per UR Regulation:  Reviewed for med. necessity/level of care/duration of stay  If discussed at [place] of Stay Meetings, dates discussed:    Comments:  Giabella Duhart RN, BSN, MSHL, CCM  Nurse - Case Manager,  (Unit [place])  [phone]  06/30/2014 TC from [place] / [facility] to confirm contact made with patient post hospital d/c.   Cambrea Kirt RN, BSN, MSHL, CCM  Nurse - Case Manager,  (Unit [facility])  [phone]  06/25/2014 Initial IM 06/25/2014 provided by admissions chest pain, SOB.  CHF exacerbation Social:  From home Disposition Plan:  Home with HHS:  RN, PT Services [place] [facility] / [facility] notified)

## 2014-06-25 NOTE — ED Provider Notes (Addendum)
Pt reports SOB over the past few weeks that has gotten worse over the past 2 days. She has had a mild cough with white mucus, no fever. Has some anterior chest pressure. She has had some swelling of her legs.   Cardiologist Dr Claiborne Billings  PE pleasant in NAD. Lungs are clear. Has some edema of her lower extremities.   Medical screening examination/treatment/procedure(s) were conducted as a shared visit with non-physician practitioner(s) and myself.  I personally evaluated the patient during the encounter.   EKG Interpretation   Date/Time:  Thursday June 25 2014 14:49:57 EDT Ventricular Rate:  70 PR Interval:    QRS Duration: 148 QT Interval:  436 QTC Calculation: 470 R Axis:   125 Text Interpretation:  Atrial fibrillation with a competing junctional  pacemaker with premature ventricular or aberrantly conducted complexes  Right axis deviation Non-specific intra-ventricular conduction block No  significant change since last tracing 04 Nov 2012 Confirmed by [facility]   MD-I, Tytianna Greenley ([id number]) on 06/25/2014 5:40:39 PM       Rolland Porter, MD, Alanson Aly, MD 06/25/14 3818  Janice Norrie, MD 06/26/14 1411

## 2014-06-26 DIAGNOSIS — Z7901 Long term (current) use of anticoagulants: Secondary | ICD-10-CM

## 2014-06-26 DIAGNOSIS — I359 Nonrheumatic aortic valve disorder, unspecified: Secondary | ICD-10-CM

## 2014-06-26 DIAGNOSIS — J810 Acute pulmonary edema: Secondary | ICD-10-CM | POA: Diagnosis present

## 2014-06-26 DIAGNOSIS — I447 Left bundle-branch block, unspecified: Secondary | ICD-10-CM

## 2014-06-26 LAB — OCCULT BLOOD, POC DEVICE: Fecal Occult Bld: NEGATIVE

## 2014-06-26 LAB — BASIC METABOLIC PANEL
ANION GAP: 14 (ref 5–15)
BUN: 8 mg/dL (ref 6–23)
CALCIUM: 8.6 mg/dL (ref 8.4–10.5)
CO2: 28 mEq/L (ref 19–32)
Chloride: 104 mEq/L (ref 96–112)
Creatinine, Ser: 0.84 mg/dL (ref 0.50–1.10)
GFR calc Af Amer: 69 mL/min — ABNORMAL LOW (ref >90)
GFR, EST NON AFRICAN AMERICAN: 59 mL/min — AB (ref >90)
Glucose, Bld: 100 mg/dL — ABNORMAL HIGH (ref 70–99)
Potassium: 3.7 mEq/L (ref 3.7–5.3)
SODIUM: 146 meq/L (ref 137–147)

## 2014-06-26 LAB — PROTIME-INR
INR: 3.98 — AB (ref 0.00–1.49)
Prothrombin Time: 38.8 seconds — ABNORMAL HIGH (ref 11.6–15.2)

## 2014-06-26 LAB — IRON AND TIBC
IRON: 47 ug/dL (ref 42–135)
Saturation Ratios: 14 % — ABNORMAL LOW (ref 20–55)
TIBC: 336 ug/dL (ref 250–470)
UIBC: 289 ug/dL (ref 125–400)

## 2014-06-26 LAB — RETICULOCYTES
RBC.: 4.35 MIL/uL (ref 3.87–5.11)
RETIC COUNT ABSOLUTE: 47.9 10*3/uL (ref 19.0–186.0)
Retic Ct Pct: 1.1 % (ref 0.4–3.1)

## 2014-06-26 LAB — TROPONIN I
Troponin I: <0.3 ng/mL (ref <0.30)
Troponin I: <0.3 ng/mL (ref <0.30)

## 2014-06-26 MED ORDER — FUROSEMIDE 10 MG/ML IJ SOLN: 40.0000 mg | Freq: Three times a day (TID) | INTRAMUSCULAR | Status: DC

## 2014-06-26 MED ORDER — POLYETHYLENE GLYCOL 3350 17 G PO PACK
17.0000 g | PACK | Freq: Every day | ORAL | Status: AC
  Administered 2014-06-27: 17 g via ORAL
  Filled 2014-06-26 (×2): qty 1

## 2014-06-26 MED ORDER — FERROUS SULFATE 325 (65 FE) MG PO TABS
325.0000 mg | ORAL_TABLET | Freq: Three times a day (TID) | ORAL | Status: DC
  Administered 2014-06-26 – 2014-06-29 (×9): 325 mg via ORAL
  Filled 2014-06-26 (×12): qty 1

## 2014-06-26 MED ORDER — ALUM & MAG HYDROXIDE-SIMETH 200-200-20 MG/5ML PO SUSP: 30.0000 mL | Freq: Four times a day (QID) | ORAL | Status: DC | PRN

## 2014-06-26 MED ORDER — GUAIFENESIN 100 MG/5ML PO SYRP
200.0000 mg | ORAL_SOLUTION | ORAL | Status: DC | PRN
  Administered 2014-06-26: 200 mg via ORAL
  Filled 2014-06-26: qty 10

## 2014-06-26 MED ORDER — FUROSEMIDE 10 MG/ML IJ SOLN
40.0000 mg | Freq: Two times a day (BID) | INTRAMUSCULAR | Status: DC
  Administered 2014-06-26 – 2014-06-28 (×5): 40 mg via INTRAVENOUS
  Filled 2014-06-26 (×6): qty 4

## 2014-06-26 MED ORDER — PANTOPRAZOLE SODIUM 40 MG PO TBEC
40.0000 mg | DELAYED_RELEASE_TABLET | Freq: Two times a day (BID) | ORAL | Status: DC
  Administered 2014-06-26 – 2014-06-29 (×6): 40 mg via ORAL
  Filled 2014-06-26 (×6): qty 1

## 2014-06-26 NOTE — ED Provider Notes (Signed)
See prior note   Janice Norrie, MD 06/26/14 [phone]

## 2014-06-26 NOTE — Evaluation (Signed)
Physical Therapy Evaluation Patient Details Name: [name] MRN: [id number] DOB: [birth year] Today's Date: 06/26/2014   History of Present Illness  Pt is a 80 y/o female who was previously living independently in her condo PTA. Pt presents with SOB and coughing up frothy sputum. She is currently being treated for CHF and acute pulmonary edema.   Clinical Impression  Pt admitted with the above. Pt currently with functional limitations due to the deficits listed below (see PT Problem List). At the time of PT eval, pt was able to ambulate with HHA and minimal unsteadiness. States she does not feel like she is back to her baseline of function yet. Pt will benefit from skilled PT to increase their independence and safety with mobility to allow discharge to the venue listed below.       Follow Up Recommendations Home health PT;Supervision for mobility/OOB    Equipment Recommendations  None recommended by PT    Recommendations for Other Services       Precautions / Restrictions Precautions Precautions: Fall Restrictions Weight Bearing Restrictions: No      Mobility  Bed Mobility Overal bed mobility: Needs Assistance Bed Mobility: Supine to Sit     Supine to sit: Supervision     General bed mobility comments: No physical assist required for transition to EOB. Use of bed rails and increased time required, supervision for safety.   Transfers Overall transfer level: Needs assistance Equipment used: 1 person hand held assist Transfers: Sit to/from Stand Sit to Stand: Min guard         General transfer comment: Steadying assist only as pt completed transfer to full standing. No AD used, and pt had no difficulty powering up to standing.   Ambulation/Gait Ambulation/Gait assistance: Min assist Ambulation Distance (Feet): 50 Feet Assistive device: 1 person hand held assist Gait Pattern/deviations: Step-through pattern;Decreased stride length;Decreased stance time -  right;Trunk flexed Gait velocity: Decreased Gait velocity interpretation: Below normal speed for age/gender General Gait Details: VC's for improved posture and general safety awareness. HHA for support and min assist for occasional insteadiness. Gait training limited by nausea with continued belching.   Stairs            Wheelchair Mobility    Modified Rankin (Stroke Patients Only)       Balance Overall balance assessment: Needs assistance Sitting-balance support: Feet supported;No upper extremity supported Sitting balance-Leahy Scale: Fair     Standing balance support: Single extremity supported;During functional activity Standing balance-Leahy Scale: Fair Standing balance comment: UE support not required for static standing, however pt slightly unsteady with dynamic movement.                              Pertinent Vitals/Pain Vitals stable throughout session.     Home Living Family/patient expects to be discharged to:: Private residence Living Arrangements: Alone Available Help at Discharge: Family;Available 24 hours/day (If needed) Type of Home: Apartment Home Access: Level entry     Home Layout: One level Home Equipment: Walker - 4 wheels;Cane - single point;Shower seat - built in Lobbyist is pretty low)      Prior Function Level of Independence: Independent         Comments: Occasionally uses the cane if she is out doing a lot of walking, but no longer uses the walker. Still driving, able to do own grocery shopping and cooking.      Hand Dominance   Dominant Hand:  Right    Extremity/Trunk Assessment   Upper Extremity Assessment: Generalized weakness           Lower Extremity Assessment: Generalized weakness      Cervical / Trunk Assessment: Kyphotic  Communication   Communication: No difficulties  Cognition Arousal/Alertness: Awake/alert Behavior During Therapy: WFL for tasks assessed/performed Overall Cognitive Status:  Within Functional Limits for tasks assessed                      General Comments      Exercises        Assessment/Plan    PT Assessment Patient needs continued PT services  PT Diagnosis Generalized weakness   PT Problem List Decreased strength;Decreased range of motion;Decreased activity tolerance;Decreased balance;Decreased mobility;Decreased knowledge of use of DME;Decreased safety awareness;Decreased knowledge of precautions  PT Treatment Interventions DME instruction;Gait training;Stair training;Functional mobility training;Therapeutic activities;Therapeutic exercise;Neuromuscular re-education;Patient/family education   PT Goals (Current goals can be found in the Care Plan section) Acute Rehab PT Goals Patient Stated Goal: To return to living independently in her condo PT Goal Formulation: With patient Time For Goal Achievement: 07/03/14 Potential to Achieve Goals: Good    Frequency Min 3X/week   Barriers to discharge Decreased caregiver support Pt lives alone    Co-evaluation               End of Session Equipment Utilized During Treatment: Gait belt Activity Tolerance: Patient tolerated treatment well Patient left: in chair;with chair alarm set;with call bell/phone within reach Nurse Communication: Mobility status         Time: 1143-1209 PT Time Calculation (min): 26 min   Charges:   PT Evaluation $Initial PT Evaluation Tier I: 1 Procedure PT Treatments $Therapeutic Activity: 8-22 mins   PT G Codes:          Jolyn Lent 06/26/2014, 1:42 PM  Jolyn Lent, PT, DPT Acute Rehabilitation Services Pager: [phone]

## 2014-06-26 NOTE — Progress Notes (Signed)
[facility] PROGRESS NOTE Interim History: 80 y.o. female Who follows with Dr. Claiborne Billings cardiology. She comes to the ER with a several day history of chest pain and shortness of breath. Patient admits that she has a cough and is coughing up frothy white sputum. She has inability to lay flat. Her shortness of breath is worse with exertion. Chest pain is described as an ache which is worse with coughing. No HN to the jaw or left arm  [facility] Weights   06/25/14 2207 06/26/14 0637  Weight: 77.066 kg (169 lb 14.4 oz) 76.386 kg (168 lb 6.4 oz)        Intake/Output Summary (Last 24 hours) at 06/26/14 1001 Last data filed at 06/26/14 8101  Gross per 24 hour  Intake    240 ml  Output    900 ml  Net   -660 ml     Assessment/Plan: CHF (congestive heart failure)/Acute pulmonary edema: - cont IV lasix, mod UOP. - monitor electrolytes. Weight 7603 kg. - strict I and o daily weight. Echo pending. - cardiac markers negative x2, EKG unchanged from previous.  Microcytic Anemia: - anemia PNL - RDW high, will start oral iron, last colonoscopy 1 year ago.  ATRIAL FIBRILLATION, CHRONIC - dig level 0.6, rate controlled - INR therapeutic.    Code Status: full Family Communication: none  Disposition Plan: inpatient   Consultants:  none  Procedures: ECHO: pending  Antibiotics:  None  HPI/Subjective: Relates SOB is unchanged, now with belching  Objective: Filed Vitals:   06/25/14 2130 06/25/14 2207 06/26/14 0204 06/26/14 0637  BP: 105/46 120/61 100/51 118/52  Pulse: 71 75 58 59  Temp:  97.9 F (36.6 C) 97.9 F (36.6 C) 98.3 F (36.8 C)  TempSrc:  Oral Oral Oral  Resp: 26 20 18 18   Height:  5\' 6"  (1.676 m)    Weight:  77.066 kg (169 lb 14.4 oz)  76.386 kg (168 lb 6.4 oz)  SpO2: 94% 94% 95% 95%     Exam:  General: Alert, awake, oriented x3, in no acute distress.  HEENT: No bruits, no goiter. +JVD Heart: Regular rate and rhythm. Lungs: Good air movement, crackles  on the right Abdomen: Soft, nontender, nondistended, positive bowel sounds.     Data Reviewed: Basic Metabolic Panel:  Recent Labs Lab 06/25/14 1639 06/26/14 0449  NA 143 146  K 4.0 3.7  CL 101 104  CO2 25 28  GLUCOSE 106* 100*  BUN 9 8  CREATININE 0.74 0.84  CALCIUM 9.0 8.6   Liver Function Tests: No results found for this basename: AST, ALT, ALKPHOS, BILITOT, PROT, ALBUMIN,  in the last 168 hours No results found for this basename: LIPASE, AMYLASE,  in the last 168 hours No results found for this basename: AMMONIA,  in the last 168 hours CBC:  Recent Labs Lab 06/25/14 1639  WBC 6.0  HGB 9.6*  HCT 32.9*  MCV 72.9*  PLT 368   Cardiac Enzymes:  Recent Labs Lab 06/25/14 2300 06/26/14 0449  TROPONINI <0.30 <0.30   BNP (last 3 results)  Recent Labs  06/25/14 1639  PROBNP 5885.0*   CBG: No results found for this basename: GLUCAP,  in the last 168 hours  No results found for this or any previous visit (from the past 240 hour(s)).   Studies: Dg Chest Port 1 View  06/25/2014   CLINICAL DATA:  Shortness of breath with history of atrial fibrillation and pulmonary embolism  EXAM: PORTABLE CHEST - 1 VIEW  COMPARISON:  Chest x-ray of September 19, 2013  FINDINGS: The lungs are adequately inflated. There is partial obscuration of the left hemidiaphragm which is new. The cardiopericardial silhouette remains enlarged. The pulmonary vascularity is engorged. Coarse infrahilar lung markings bilaterally are likely related to this congestion. A small left pleural effusion is suspected.  There is severe thoracolumbar levoscoliosis. There is degenerative change of the right shoulder.  IMPRESSION: Mild CHF superimposed upon COPD. Basilar atelectasis or less likely pneumonia is suspected.   Electronically Signed   By: David  Martinique   On: 06/25/2014 17:24    Scheduled Meds: . aspirin  324 mg Oral Once  . cycloSPORINE  1 drop Both Eyes BID  . digoxin  0.125 mg Oral Daily  .  furosemide  40 mg Intravenous BID  . lisinopril  2.5 mg Oral Daily  . nebivolol  2.5 mg Oral Daily  . sodium chloride  3 mL Intravenous Q12H  . warfarin  1 mg Oral ONCE-1800  . Warfarin - Pharmacist Dosing Inpatient   Does not apply q1800   Continuous Infusions:    Charlynne Cousins  [facility] Pager [phone]  If 8PM-8AM, please contact night-coverage at [email or web address], password [facility] 06/26/2014, 10:01 AM  LOS: 1 day     **Disclaimer: This note may have been dictated with voice recognition software. Similar sounding words can inadvertently be transcribed and this note may contain transcription errors which may not have been corrected upon publication of note.**

## 2014-06-26 NOTE — Progress Notes (Signed)
  Echocardiogram 2D Echocardiogram has been performed.  [name] FRANCES 06/26/2014, 5:06 PM

## 2014-06-27 DIAGNOSIS — D509 Iron deficiency anemia, unspecified: Secondary | ICD-10-CM | POA: Diagnosis present

## 2014-06-27 DIAGNOSIS — J810 Acute pulmonary edema: Secondary | ICD-10-CM

## 2014-06-27 LAB — BASIC METABOLIC PANEL
ANION GAP: 13 (ref 5–15)
BUN: 11 mg/dL (ref 6–23)
CALCIUM: 8.7 mg/dL (ref 8.4–10.5)
CO2: 29 meq/L (ref 19–32)
Chloride: 102 mEq/L (ref 96–112)
Creatinine, Ser: 1.01 mg/dL (ref 0.50–1.10)
GFR, EST AFRICAN AMERICAN: 55 mL/min — AB (ref >90)
GFR, EST NON AFRICAN AMERICAN: 48 mL/min — AB (ref >90)
Glucose, Bld: 100 mg/dL — ABNORMAL HIGH (ref 70–99)
Potassium: 3.7 mEq/L (ref 3.7–5.3)
SODIUM: 144 meq/L (ref 137–147)

## 2014-06-27 LAB — PROTIME-INR
INR: 3.69 — ABNORMAL HIGH (ref 0.00–1.49)
Prothrombin Time: 36.6 seconds — ABNORMAL HIGH (ref 11.6–15.2)

## 2014-06-27 LAB — FERRITIN: FERRITIN: 20 ng/mL (ref 10–291)

## 2014-06-27 LAB — FOLATE: Folate: 13.6 ng/mL

## 2014-06-27 LAB — VITAMIN B12: Vitamin B-12: 695 pg/mL (ref 211–911)

## 2014-06-27 MED ORDER — SODIUM CHLORIDE 0.9 % IV SOLN
25.0000 mg | Freq: Once | INTRAVENOUS | Status: AC
  Administered 2014-06-27: 25 mg via INTRAVENOUS
  Filled 2014-06-27: qty 0.5

## 2014-06-27 MED ORDER — SODIUM CHLORIDE 0.9 % IV SOLN
500.0000 mg | Freq: Once | INTRAVENOUS | Status: DC
  Administered 2014-06-27: 500 mg via INTRAVENOUS
  Filled 2014-06-27: qty 10

## 2014-06-27 MED ORDER — SODIUM CHLORIDE 0.9 % IV SOLN
510.0000 mg | Freq: Once | INTRAVENOUS | Status: AC
  Administered 2014-06-27: 510 mg via INTRAVENOUS
  Filled 2014-06-27: qty 17

## 2014-06-27 NOTE — Progress Notes (Signed)
ANTICOAGULATION CONSULT NOTE - Follow Up Consult  Pharmacy Consult for warfarin Indication: atrial fibrillation  Allergies  Allergen Reactions  . Amiodarone     Elevated LFTS  . Meperidine Hcl Nausea And Vomiting    REACTION: nausea: VOMIT  . Morphine Sulfate Nausea And Vomiting    REACTION: nausea: "VOMIT"    Patient Measurements: Height: 5\' 6"  (167.6 cm) Weight: 168 lb 6.4 oz (76.386 kg) (scale C) IBW/kg (Calculated) : 59.3  Vital Signs: Temp: 97.2 F (36.2 C) (07/11 0215) Temp src: Oral (07/11 0215) BP: 100/50 mmHg (07/11 1008) Pulse Rate: 66 (07/11 1008)  Labs:  Recent Labs  06/25/14 1639 06/25/14 2300 06/26/14 0449 06/26/14 1048 06/27/14 0403  HGB 9.6*  --   --   --   --   HCT 32.9*  --   --   --   --   PLT 368  --   --   --   --   LABPROT 35.0*  --  38.8*  --  36.6*  INR 3.48*  --  3.98*  --  3.69*  CREATININE 0.74  --  0.84  --  1.01  TROPONINI  --  <0.30 <0.30 <0.30  --     Estimated Creatinine Clearance: 38.6 ml/min (by C-G formula based on Cr of 1.01).   Assessment: 80 y/o female who presented to the ED on 7/9 with chest pain, cough and shortness of breath. She was n warfarin prior to admission for atrial fibrillation. INR was supratherapeutic on admit at 3.48. Warfarin was held 7/9 and 7/10.  PTA dose is 2 mg daily except 1 mg Mon, Fri.   INR remains supratherapeutic today at 3.69 despite holding doses. No bleeding noted, iron deficiency anemia noted. Will hold another dose tonight.  Goal of Therapy:  INR 2-3 Monitor platelets by anticoagulation protocol: Yes   Plan:  - No warfarin tonight - INR daily - Monitor for s/sx of bleeding  [facility], Pharm.D., BCPS Clinical Pharmacist Pager: [phone] 06/27/2014 11:59 AM

## 2014-06-27 NOTE — Progress Notes (Signed)
Called to room by patient complain of dizziness and blurred vision while infusing iron, test dose was given  About  1400 patient was fine. Iron iv stopped,  MD and pharmacy notified, no order given Patient is ok now vision is back to normal, no more dizziness, v/s is at the baseline 104/52, 94% RA, HR. 59. Will continue to monitor patient.

## 2014-06-27 NOTE — Progress Notes (Addendum)
[facility] PROGRESS NOTE Interim History: 80 y.o. female Who follows with Dr. Claiborne Billings cardiology. She comes to the ER with a several day history of chest pain and shortness of breath. Patient admits that she has a cough and is coughing up frothy white sputum. She has inability to lay flat. Her shortness of breath is worse with exertion. Chest pain is described as an ache which is worse with coughing. No HN to the jaw or left arm  Filed Weights   06/25/14 2207 06/26/14 0637  Weight: 77.066 kg (169 lb 14.4 oz) 76.386 kg (168 lb 6.4 oz)        Intake/Output Summary (Last 24 hours) at 06/27/14 1004 Last data filed at 06/27/14 6767  Gross per 24 hour  Intake    600 ml  Output   2178 ml  Net  -1578 ml     Assessment/Plan: CHF (congestive heart failure)/Acute pulmonary edema: - Cont IV lasix, Good UOP.apply ted hose. - Monitor electrolytes. Weight 76.3 kg. - Strict I and o daily weight. Echo as below.. - Cardiac markers negative x2, EKG unchanged from previous.  Microcytic Anemia due to iron deficiency: - Anemia PNL: showed ferritin of 20, she is on coumadin. Give IV iron (INFED) started having blurry vision dizziness. Start Feraheme. - RDW high, will start oral iron, last colonoscopy 1 year ago.  ATRIAL FIBRILLATION, CHRONIC - dig level 0.6, rate controlled - INR therapeutic.    Code Status: full Family Communication: none  Disposition Plan: inpatient   Consultants:  none  Procedures: ECHO 7.10.2015: The cavity size was normal. Wall thickness was increased in a pattern of mild LVH. Systolic function was normal. The estimated ejection fraction was in the range of 50% to 55%. Wall motion was normal; there were no regional wall motion abnormalities. - Aortic valve: There was mild regurgitation. - Mitral valve: There was mild regurgitation. - Left atrium: The atrium was moderately to severely dilated. - Right ventricle: The cavity size was moderately dilated. - Right  atrium: The atrium was moderately dilated.  Pulmonary arteries: PA peak pressure: 45 mm Hg (S).  Antibiotics:  None  HPI/Subjective: Relates SOB is improved.  Objective: Filed Vitals:   06/26/14 1331 06/26/14 1927 06/26/14 2241 06/27/14 0215  BP: 88/45 108/55 110/52 108/56  Pulse: 65 58 52 58  Temp: 97.8 F (36.6 C) 97.6 F (36.4 C) 97.5 F (36.4 C) 97.2 F (36.2 C)  TempSrc:  Oral Oral Oral  Resp: 16 18 20 18   Height:      Weight:      SpO2: 96% 96% 95% 100%     Exam:  General: Alert, awake, oriented x3, in no acute distress.  HEENT: No bruits, no goiter. +JVD Heart: Regular rate and rhythm. Lungs: Good air movement, crackles on the right Abdomen: Soft, nontender, nondistended, positive bowel sounds.     Data Reviewed: Basic Metabolic Panel:  Recent Labs Lab 06/25/14 1639 06/26/14 0449 06/27/14 0403  NA 143 146 144  K 4.0 3.7 3.7  CL 101 104 102  CO2 25 28 29   GLUCOSE 106* 100* 100*  BUN 9 8 11   CREATININE 0.74 0.84 1.01  CALCIUM 9.0 8.6 8.7   Liver Function Tests: No results found for this basename: AST, ALT, ALKPHOS, BILITOT, PROT, ALBUMIN,  in the last 168 hours No results found for this basename: LIPASE, AMYLASE,  in the last 168 hours No results found for this basename: AMMONIA,  in the last 168 hours CBC:  Recent  Labs Lab 06/25/14 1639  WBC 6.0  HGB 9.6*  HCT 32.9*  MCV 72.9*  PLT 368   Cardiac Enzymes:  Recent Labs Lab 06/25/14 2300 06/26/14 0449 06/26/14 1048  TROPONINI <0.30 <0.30 <0.30   BNP (last 3 results)  Recent Labs  06/25/14 1639  PROBNP 5885.0*   CBG: No results found for this basename: GLUCAP,  in the last 168 hours  No results found for this or any previous visit (from the past 240 hour(s)).   Studies: Dg Chest Port 1 View  06/25/2014   CLINICAL DATA:  Shortness of breath with history of atrial fibrillation and pulmonary embolism  EXAM: PORTABLE CHEST - 1 VIEW  COMPARISON:  Chest x-ray of September 19, 2013   FINDINGS: The lungs are adequately inflated. There is partial obscuration of the left hemidiaphragm which is new. The cardiopericardial silhouette remains enlarged. The pulmonary vascularity is engorged. Coarse infrahilar lung markings bilaterally are likely related to this congestion. A small left pleural effusion is suspected.  There is severe thoracolumbar levoscoliosis. There is degenerative change of the right shoulder.  IMPRESSION: Mild CHF superimposed upon COPD. Basilar atelectasis or less likely pneumonia is suspected.   Electronically Signed   By: David  Martinique   On: 06/25/2014 17:24    Scheduled Meds: . cycloSPORINE  1 drop Both Eyes BID  . digoxin  0.125 mg Oral Daily  . ferrous sulfate  325 mg Oral TID WC  . furosemide  40 mg Intravenous Q12H  . lisinopril  2.5 mg Oral Daily  . nebivolol  2.5 mg Oral Daily  . pantoprazole  40 mg Oral BID AC  . polyethylene glycol  17 g Oral Daily  . sodium chloride  3 mL Intravenous Q12H  . Warfarin - Pharmacist Dosing Inpatient   Does not apply q1800   Continuous Infusions:    Charlynne Cousins  [facility] Pager [phone]  If 8PM-8AM, please contact night-coverage at [email or web address], password [facility] 06/27/2014, 10:04 AM  LOS: 2 days     **Disclaimer: This note may have been dictated with voice recognition software. Similar sounding words can inadvertently be transcribed and this note may contain transcription errors which may not have been corrected upon publication of note.**

## 2014-06-28 LAB — BASIC METABOLIC PANEL
Anion gap: 15 (ref 5–15)
BUN: 13 mg/dL (ref 6–23)
CO2: 29 mEq/L (ref 19–32)
CREATININE: 0.99 mg/dL (ref 0.50–1.10)
Calcium: 8.8 mg/dL (ref 8.4–10.5)
Chloride: 102 mEq/L (ref 96–112)
GFR calc Af Amer: 56 mL/min — ABNORMAL LOW (ref >90)
GFR, EST NON AFRICAN AMERICAN: 49 mL/min — AB (ref >90)
Glucose, Bld: 100 mg/dL — ABNORMAL HIGH (ref 70–99)
POTASSIUM: 3.5 meq/L — AB (ref 3.7–5.3)
Sodium: 146 mEq/L (ref 137–147)

## 2014-06-28 LAB — PROTIME-INR
INR: 3.06 — ABNORMAL HIGH (ref 0.00–1.49)
Prothrombin Time: 31.6 seconds — ABNORMAL HIGH (ref 11.6–15.2)

## 2014-06-28 MED ORDER — POLYETHYLENE GLYCOL 3350 17 G PO PACK
17.0000 g | PACK | Freq: Every day | ORAL | Status: DC
  Administered 2014-06-28: 17 g via ORAL
  Filled 2014-06-28 (×2): qty 1

## 2014-06-28 MED ORDER — ONDANSETRON 8 MG/NS 50 ML IVPB
8.0000 mg | Freq: Four times a day (QID) | INTRAVENOUS | Status: DC | PRN
  Administered 2014-06-28: 8 mg via INTRAVENOUS
  Filled 2014-06-28 (×2): qty 8

## 2014-06-28 MED ORDER — WARFARIN SODIUM 1 MG PO TABS
1.0000 mg | ORAL_TABLET | Freq: Once | ORAL | Status: AC
  Administered 2014-06-28: 1 mg via ORAL
  Filled 2014-06-28: qty 1

## 2014-06-28 MED ORDER — POTASSIUM CHLORIDE CRYS ER 20 MEQ PO TBCR
40.0000 meq | EXTENDED_RELEASE_TABLET | Freq: Two times a day (BID) | ORAL | Status: AC
  Administered 2014-06-28 (×2): 40 meq via ORAL
  Filled 2014-06-28 (×2): qty 2

## 2014-06-28 NOTE — Progress Notes (Signed)
ANTICOAGULATION CONSULT NOTE - Follow Up Consult  Pharmacy Consult for warfarin Indication: atrial fibrillation  Allergies  Allergen Reactions  . Amiodarone     Elevated LFTS  . Infed [Iron Dextran]     Dizziness  . Meperidine Hcl Nausea And Vomiting    REACTION: nausea: VOMIT  . Morphine Sulfate Nausea And Vomiting    REACTION: nausea: "VOMIT"    Patient Measurements: Height: 5\' 6"  (167.6 cm) Weight: 165 lb (74.844 kg) (scale c) IBW/kg (Calculated) : 59.3  Vital Signs: Temp: 97.9 F (36.6 C) (07/12 0535) Temp src: Oral (07/12 0535) BP: 101/48 mmHg (07/12 1024) Pulse Rate: 61 (07/12 1024)  Labs:  Recent Labs  06/25/14 1639 06/25/14 2300 06/26/14 0449 06/26/14 1048 06/27/14 0403 06/28/14 0640  HGB 9.6*  --   --   --   --   --   HCT 32.9*  --   --   --   --   --   PLT 368  --   --   --   --   --   LABPROT 35.0*  --  38.8*  --  36.6* 31.6*  INR 3.48*  --  3.98*  --  3.69* 3.06*  CREATININE 0.74  --  0.84  --  1.01 0.99  TROPONINI  --  <0.30 <0.30 <0.30  --   --     Estimated Creatinine Clearance: 39.1 ml/min (by C-G formula based on Cr of 0.99).   Assessment: 80 y/o female who presented to the ED on 7/9 with chest pain, cough and shortness of breath. She was pn warfarin prior to admission for atrial fibrillation. INR was supratherapeutic on admit at 3.48. Warfarin was held 7/9 and 7/10.  PTA dose is 2 mg daily except 1 mg Mon, Fri.   INR is just above goal today at 3.06 but is trending down. No bleeding noted, iron deficiency anemia noted. She is not eating as much which may make her more sensitive to warfarin.  Goal of Therapy:  INR 2-3 Monitor platelets by anticoagulation protocol: Yes   Plan:  - Warfarin 1 mg PO tonight - INR daily - Monitor for s/sx of bleeding  [facility], Pharm.D., BCPS Clinical Pharmacist Pager: [phone] 06/28/2014 11:34 AM

## 2014-06-28 NOTE — Progress Notes (Signed)
[facility] PROGRESS NOTE Interim History: 80 y.o. female Who follows with Dr. Claiborne Billings cardiology. She comes to the ER with a several day history of chest pain and shortness of breath. Patient admits that she has a cough and is coughing up frothy white sputum. She has inability to lay flat. Her shortness of breath is worse with exertion. Chest pain is described as an ache which is worse with coughing. No HN to the jaw or left arm  Filed Weights   06/25/14 2207 06/26/14 0637 06/28/14 0535  Weight: 77.066 kg (169 lb 14.4 oz) 76.386 kg (168 lb 6.4 oz) 74.844 kg (165 lb)        Intake/Output Summary (Last 24 hours) at 06/28/14 1120 Last data filed at 06/28/14 0900  Gross per 24 hour  Intake    717 ml  Output   2900 ml  Net  -2183 ml     Assessment/Plan: CHF (congestive heart failure)/Acute pulmonary edema: - Cont IV lasix, change to orals in am, mod UOP.apply ted hose. - Monitor electrolytes. Weight 74.8 kg. - Strict I and o daily weight. Echo as below. - Cardiac markers negative x2, EKG unchanged from previous.  Microcytic Anemia due to iron deficiency: - Anemia PNL: showed ferritin of 20, she is on coumadin. Give IV iron (INFED) started having blurry vision dizziness. Start Feraheme. - RDW high, will start oral iron, last colonoscopy 1 year ago.  ATRIAL FIBRILLATION, CHRONIC - dig level 0.6, rate controlled - INR therapeutic.    Code Status: full Family Communication: none  Disposition Plan: inpatient   Consultants:  none  Procedures: ECHO 7.10.2015: The cavity size was normal. Wall thickness was increased in a pattern of mild LVH. Systolic function was normal. The estimated ejection fraction was in the range of 50% to 55%. Wall motion was normal; there were no regional wall motion abnormalities. - Aortic valve: There was mild regurgitation. - Mitral valve: There was mild regurgitation. - Left atrium: The atrium was moderately to severely dilated. - Right  ventricle: The cavity size was moderately dilated. - Right atrium: The atrium was moderately dilated.  Pulmonary arteries: PA peak pressure: 45 mm Hg (S).  Antibiotics:  None  HPI/Subjective: Except for feeling tired no further complains  Objective: Filed Vitals:   06/27/14 1341 06/27/14 2153 06/28/14 0535 06/28/14 1024  BP: 92/46 101/53 106/50 101/48  Pulse: 55 58 87 61  Temp: 98 F (36.7 C) 98.6 F (37 C) 97.9 F (36.6 C)   TempSrc: Oral Oral Oral   Resp: 18 18 18    Height:      Weight:   74.844 kg (165 lb)   SpO2: 95% 91% 90%      Exam:  General: Alert, awake, oriented x3, in no acute distress.  HEENT: No bruits, no goiter. - JVD Heart: Regular rate and rhythm. Lungs: Good air movement, crackles on the right Abdomen: Soft, nontender, nondistended, positive bowel sounds.     Data Reviewed: Basic Metabolic Panel:  Recent Labs Lab 06/25/14 1639 06/26/14 0449 06/27/14 0403 06/28/14 0640  NA 143 146 144 146  K 4.0 3.7 3.7 3.5*  CL 101 104 102 102  CO2 25 28 29 29   GLUCOSE 106* 100* 100* 100*  BUN 9 8 11 13   CREATININE 0.74 0.84 1.01 0.99  CALCIUM 9.0 8.6 8.7 8.8   Liver Function Tests: No results found for this basename: AST, ALT, ALKPHOS, BILITOT, PROT, ALBUMIN,  in the last 168 hours No results found for this  basename: LIPASE, AMYLASE,  in the last 168 hours No results found for this basename: AMMONIA,  in the last 168 hours CBC:  Recent Labs Lab 06/25/14 1639  WBC 6.0  HGB 9.6*  HCT 32.9*  MCV 72.9*  PLT 368   Cardiac Enzymes:  Recent Labs Lab 06/25/14 2300 06/26/14 0449 06/26/14 1048  TROPONINI <0.30 <0.30 <0.30   BNP (last 3 results)  Recent Labs  06/25/14 1639  PROBNP 5885.0*   CBG: No results found for this basename: GLUCAP,  in the last 168 hours  No results found for this or any previous visit (from the past 240 hour(s)).   Studies: No results found.  Scheduled Meds: . cycloSPORINE  1 drop Both Eyes BID  . digoxin   0.125 mg Oral Daily  . ferrous sulfate  325 mg Oral TID WC  . furosemide  40 mg Intravenous Q12H  . lisinopril  2.5 mg Oral Daily  . nebivolol  2.5 mg Oral Daily  . pantoprazole  40 mg Oral BID AC  . polyethylene glycol  17 g Oral Daily  . potassium chloride  40 mEq Oral BID  . sodium chloride  3 mL Intravenous Q12H  . Warfarin - Pharmacist Dosing Inpatient   Does not apply q1800   Continuous Infusions:    Charlynne Cousins  [facility] Pager [phone]  If 8PM-8AM, please contact night-coverage at [email or web address], password [facility] 06/28/2014, 11:20 AM  LOS: 3 days     **Disclaimer: This note may have been dictated with voice recognition software. Similar sounding words can inadvertently be transcribed and this note may contain transcription errors which may not have been corrected upon publication of note.**

## 2014-06-28 NOTE — Progress Notes (Signed)
Report given to receiving RN. Patient in bed resting with family at bedside. No signs or symptoms of distress or discomfort.

## 2014-06-29 ENCOUNTER — Ambulatory Visit (INDEPENDENT_AMBULATORY_CARE_PROVIDER_SITE_OTHER): Payer: Medicare Other | Admitting: Pharmacist

## 2014-06-29 DIAGNOSIS — I4891 Unspecified atrial fibrillation: Secondary | ICD-10-CM

## 2014-06-29 DIAGNOSIS — I5033 Acute on chronic diastolic (congestive) heart failure: Principal | ICD-10-CM

## 2014-06-29 DIAGNOSIS — Z7901 Long term (current) use of anticoagulants: Secondary | ICD-10-CM

## 2014-06-29 LAB — BASIC METABOLIC PANEL
Anion gap: 13 (ref 5–15)
BUN: 15 mg/dL (ref 6–23)
CO2: 29 mEq/L (ref 19–32)
Calcium: 9.1 mg/dL (ref 8.4–10.5)
Chloride: 100 mEq/L (ref 96–112)
Creatinine, Ser: 1.1 mg/dL (ref 0.50–1.10)
GFR calc Af Amer: 50 mL/min — ABNORMAL LOW (ref >90)
GFR calc non Af Amer: 43 mL/min — ABNORMAL LOW (ref >90)
GLUCOSE: 102 mg/dL — AB (ref 70–99)
POTASSIUM: 4.4 meq/L (ref 3.7–5.3)
SODIUM: 142 meq/L (ref 137–147)

## 2014-06-29 LAB — PROTIME-INR
INR: 3.18 — ABNORMAL HIGH (ref 0.00–1.49)
Prothrombin Time: 32.6 seconds — ABNORMAL HIGH (ref 11.6–15.2)

## 2014-06-29 MED ORDER — PANTOPRAZOLE SODIUM 40 MG PO TBEC: 40.0000 mg | DELAYED_RELEASE_TABLET | Freq: Two times a day (BID) | ORAL | Status: AC

## 2014-06-29 MED ORDER — POLYETHYLENE GLYCOL 3350 17 G PO PACK: 17.0000 g | PACK | Freq: Every day | ORAL | Status: AC

## 2014-06-29 MED ORDER — FUROSEMIDE 40 MG PO TABS
40.0000 mg | ORAL_TABLET | Freq: Every day | ORAL | Status: DC
  Administered 2014-06-29: 40 mg via ORAL
  Filled 2014-06-29: qty 1

## 2014-06-29 MED ORDER — FERROUS SULFATE 325 (65 FE) MG PO TABS: 325.0000 mg | ORAL_TABLET | Freq: Two times a day (BID) | ORAL | Status: AC

## 2014-06-29 MED ORDER — ONDANSETRON 4 MG PO TBDP: 4.0000 mg | ORAL_TABLET | Freq: Three times a day (TID) | ORAL | Status: AC | PRN

## 2014-06-29 MED ORDER — FUROSEMIDE 10 MG/ML IJ SOLN: 40.0000 mg | Freq: Once | INTRAMUSCULAR | Status: DC

## 2014-06-29 NOTE — Progress Notes (Signed)
DC IV, DC Home, Non-Tele. Discharge instructions and home medications discussed with patient and patient's daughter. Patient and patient's daughter denied any questions or concerns at this time. Patient leaving unit via wheelchair and appears in no acute distress.

## 2014-06-29 NOTE — Discharge Summary (Addendum)
Physician Discharge Summary  [name] UXN:[id number] DOB: 1922/05/25 DOA: 06/25/2014  PCP: Christine Hampshire, MD  Admit date: 06/25/2014 Discharge date: 06/29/2014  Time spent: 35 minutes  Recommendations for Outpatient Follow-up:  1. Follow up with cardiology in 1 week. 2. Follow up with coumadin clinic today.  BNP    Component Value Date/Time   PROBNP 5885.0* 06/25/2014 1639   Filed Weights   06/26/14 0637 06/28/14 0535 06/29/14 0526  Weight: 76.386 kg (168 lb 6.4 oz) 74.844 kg (165 lb) 74.435 kg (164 lb 1.6 oz)     Discharge Diagnoses:  Principal Problem:   Acute on chronic diastolic CHF (congestive heart failure), NYHA class 2 Active Problems:   ATRIAL FIBRILLATION, CHRONIC   Left bundle branch block   Edema, lower extremity   Hypercoagulopathy   Anemia   Acute pulmonary edema   Anemia, iron deficiency   Discharge Condition: stable  Diet recommendation: low sodium diet   History of present illness:  80 y.o. female  Who follows with [name] cardiology. She comes to the ER with a several day history of chest pain and shortness of breath. Patient admits that she has a cough and is coughing up frothy white sputum. She has inability to lay flat. Her shortness of breath is worse with exertion. Chest pain is described as an ache which is worse with coughing. No HN to the jaw or left arm. Patient also admits to some lower extremity swelling of the last few days. Patient's coughing is causing her nausea and vomiting.     Hospital Course:  Acute diastolic CHF (congestive heart failure)/Acute pulmonary edema:  - Started on IV lasix, change to orals once compensated. Diurese about 5.6 L - Estimated dry weight ~ 74.8 kg.  - Strict I and o daily weight. Echo as below.  - Cardiac markers negative x2, EKG unchanged from previous.   Microcytic Anemia due to iron deficiency:  - Anemia PNL: showed ferritin of 20, she is on coumadin.  - Gave IV iron (INFED) started having  blurry vision dizziness. Changed to Feraheme.  - RDW high, , last colonoscopy 1 year ago.   ATRIAL FIBRILLATION, CHRONIC  - dig level 0.6, rate controlled  - INR therapeutic.   Procedures:  CXR  echo  Consultations:  none  Discharge Exam: Filed Vitals:   06/29/14 1038  BP: 100/66  Pulse: 76  Temp:   Resp:     General: A&O x3 Cardiovascular: RRR Respiratory: good air movement CTA B/L  Discharge Instructions      Discharge Instructions   Diet - low sodium heart healthy    Complete by:  As directed      Increase activity slowly    Complete by:  As directed             Medication List         cholecalciferol 1000 UNITS tablet  Commonly known as:  VITAMIN D  Take 1,000 Units by mouth daily.     cyanocobalamin 1000 MCG/ML injection  Commonly known as:  (VITAMIN B-12)  Inject 1,000 mcg into the muscle every 3 (three) months.     cycloSPORINE 0.05 % ophthalmic emulsion  Commonly known as:  RESTASIS  Place 1 drop into both eyes 2 (two) times daily.     digoxin 0.125 MG tablet  Commonly known as:  LANOXIN  Take by mouth daily.     ferrous sulfate 325 (65 FE) MG tablet  Take 1 tablet (325 mg  total) by mouth 2 (two) times daily with a meal.     furosemide 20 MG tablet  Commonly known as:  LASIX  Take 1 tablet (20 mg total) by mouth daily. May take extra 1 tablet (20mg ) as needed for swelling.     Lutein 20 MG Tabs  Take 1 tablet by mouth daily.     nebivolol 2.5 MG tablet  Commonly known as:  BYSTOLIC  Take 1 tablet (2.5 mg total) by mouth daily.     ondansetron 4 MG disintegrating tablet  Commonly known as:  ZOFRAN ODT  Take 1 tablet (4 mg total) by mouth every 8 (eight) hours as needed for nausea or vomiting.     pantoprazole 40 MG tablet  Commonly known as:  PROTONIX  Take 1 tablet (40 mg total) by mouth 2 (two) times daily before a meal.     polyethylene glycol packet  Commonly known as:  MIRALAX / GLYCOLAX  Take 17 g by mouth daily.      warfarin 2 MG tablet  Commonly known as:  COUMADIN  Take 1-2 mg by mouth See admin instructions. Take 1 mg on Monday and Friday.  Take 2 mg on all other days.       Allergies  Allergen Reactions  . Amiodarone     Elevated LFTS  . Infed [Iron Dextran]     Dizziness  . Meperidine Hcl Nausea And Vomiting    REACTION: nausea: VOMIT  . Morphine Sulfate Nausea And Vomiting    REACTION: nausea: "VOMIT"   Follow-up Information   Follow up with Christine Sine, MD On 07/15/2014. (hospital follow up @2 :00pm spoke with [facility] )    Specialty:  Cardiology   Contact information:   [address] [place] [address] [phone]        The results of significant diagnostics from this hospitalization (including imaging, microbiology, ancillary and laboratory) are listed below for reference.    Significant Diagnostic Studies: Dg Chest Port 1 View  06/25/2014   CLINICAL DATA:  Shortness of breath with history of atrial fibrillation and pulmonary embolism  EXAM: PORTABLE CHEST - 1 VIEW  COMPARISON:  Chest x-ray of September 19, 2013  FINDINGS: The lungs are adequately inflated. There is partial obscuration of the left hemidiaphragm which is new. The cardiopericardial silhouette remains enlarged. The pulmonary vascularity is engorged. Coarse infrahilar lung markings bilaterally are likely related to this congestion. A small left pleural effusion is suspected.  There is severe thoracolumbar levoscoliosis. There is degenerative change of the right shoulder.  IMPRESSION: Mild CHF superimposed upon COPD. Basilar atelectasis or less likely pneumonia is suspected.   Electronically Signed   By: Christine  Matthews   On: 06/25/2014 17:24    Microbiology: No results found for this or any previous visit (from the past 240 hour(s)).   Labs: Basic Metabolic Panel:  Recent Labs Lab 06/25/14 1639 06/26/14 0449 06/27/14 0403 06/28/14 0640 06/29/14 0404  NA 143 146 144 146 142  K 4.0 3.7 3.7 3.5*  4.4  CL 101 104 102 102 100  CO2 25 28 29 29 29   GLUCOSE 106* 100* 100* 100* 102*  BUN 9 8 11 13 15   CREATININE 0.74 0.84 1.01 0.99 1.10  CALCIUM 9.0 8.6 8.7 8.8 9.1   Liver Function Tests: No results found for this basename: AST, ALT, ALKPHOS, BILITOT, PROT, ALBUMIN,  in the last 168 hours No results found for this basename: LIPASE, AMYLASE,  in the last 168 hours  No results found for this basename: AMMONIA,  in the last 168 hours CBC:  Recent Labs Lab 06/25/14 1639  WBC 6.0  HGB 9.6*  HCT 32.9*  MCV 72.9*  PLT 368   Cardiac Enzymes:  Recent Labs Lab 06/25/14 2300 06/26/14 0449 06/26/14 1048  TROPONINI <0.30 <0.30 <0.30   BNP: BNP (last 3 results)  Recent Labs  06/25/14 1639  PROBNP 5885.0*   CBG: No results found for this basename: GLUCAP,  in the last 168 hours     Signed:  Charlynne Cousins  [facility] 06/29/2014, 11:20 AM   **Disclaimer: This note may have been dictated with voice recognition software. Similar sounding words can inadvertently be transcribed and this note may contain transcription errors which may not have been corrected upon publication of note.**

## 2014-06-29 NOTE — Progress Notes (Signed)
ANTICOAGULATION CONSULT NOTE - Follow Up Consult  Pharmacy Consult:  Coumadin Indication: atrial fibrillation  Allergies  Allergen Reactions  . Amiodarone     Elevated LFTS  . Infed [Iron Dextran]     Dizziness  . Meperidine Hcl Nausea And Vomiting    REACTION: nausea: VOMIT  . Morphine Sulfate Nausea And Vomiting    REACTION: nausea: "VOMIT"    Patient Measurements: Height: 5\' 6"  (167.6 cm) Weight: 164 lb 1.6 oz (74.435 kg) (scale c) IBW/kg (Calculated) : 59.3  Vital Signs: Temp: 97.9 F (36.6 C) (07/13 0526) Temp src: Oral (07/13 0526) BP: 104/43 mmHg (07/13 0526) Pulse Rate: 63 (07/13 0526)  Labs:  Recent Labs  06/26/14 1048 06/27/14 0403 06/28/14 0640 06/29/14 0404  LABPROT  --  36.6* 31.6* 32.6*  INR  --  3.69* 3.06* 3.18*  CREATININE  --  1.01 0.99 1.10  TROPONINI <0.30  --   --   --     Estimated Creatinine Clearance: 35 ml/min (by C-G formula based on Cr of 1.1).     Assessment: 80 y/o female who presented to the ED on 06/25/14 with chest pain, cough and shortness of breath. She was on Coumadin prior to admission for atrial fibrillation. INR supra-therapeutic and trended up slightly.  No bleeding reported.   Goal of Therapy:  INR 2-3 Monitor platelets by anticoagulation protocol: Yes    Plan:  - Hold Coumadin today - Daily PT / INR    Derryl Uher D. Mina Marble, PharmD, BCPS Pager:  [phone] 06/29/2014, 8:11 AM

## 2014-07-02 ENCOUNTER — Telehealth: Payer: Self-pay | Admitting: *Deleted

## 2014-07-02 DIAGNOSIS — D509 Iron deficiency anemia, unspecified: Secondary | ICD-10-CM

## 2014-07-02 NOTE — Telephone Encounter (Signed)
Patient notified of lab results and need for repeat studies. Patient reports she was in hospital for anemia was was discharged on Monday - she was given IV iron and IV lasix and also had stool guiac test. Patient agreeable to repeat labs. She also needs an earlier appointment. Will message scheduler regarding appmt.   Message forwarded to Dr. Claiborne Billings to review hospital admission info and labs from hospital.

## 2014-07-02 NOTE — Telephone Encounter (Signed)
Message copied by Fidel Levy on Thu Jul 02, 2014  9:16 AM ------      Message from: Shelva Majestic A      Created: Fri Jun 26, 2014 12:56 PM       Microcytic anemai with mcv dec to 26; f/u iron studies, ferritin, stool guiac ------

## 2014-07-03 NOTE — Telephone Encounter (Signed)
Labs reviewed; Fe deficient, stool guiacs negative.  Rec supplemental Fe

## 2014-07-03 NOTE — Telephone Encounter (Signed)
Repeat labs were ordered - she is to get those done.   Iron supplement dose? OTC or Rx?

## 2014-07-03 NOTE — Telephone Encounter (Signed)
try nu-iron 150mg  daily

## 2014-07-03 NOTE — Telephone Encounter (Signed)
Patient was given ferrous sulfate 325 (take 1 by mouth BID) post hospital.

## 2014-07-06 ENCOUNTER — Ambulatory Visit (INDEPENDENT_AMBULATORY_CARE_PROVIDER_SITE_OTHER): Payer: Medicare Other | Admitting: Pharmacist Clinician (PhC)/ Clinical Pharmacy Specialist

## 2014-07-06 ENCOUNTER — Other Ambulatory Visit: Payer: Self-pay | Admitting: Pharmacist Clinician (PhC)/ Clinical Pharmacy Specialist

## 2014-07-06 DIAGNOSIS — I4891 Unspecified atrial fibrillation: Secondary | ICD-10-CM

## 2014-07-06 DIAGNOSIS — Z7901 Long term (current) use of anticoagulants: Secondary | ICD-10-CM

## 2014-07-06 LAB — POCT INR: INR: 5.3

## 2014-07-06 MED ORDER — NEBIVOLOL HCL 5 MG PO TABS: 2.5000 mg | ORAL_TABLET | Freq: Every day | ORAL | Status: AC

## 2014-07-07 ENCOUNTER — Emergency Department (HOSPITAL_COMMUNITY)
Admission: EM | Admit: 2014-07-07 | Discharge: 2014-07-18 | Disposition: E | Payer: Medicare Other | Attending: Emergency Medicine | Admitting: Emergency Medicine

## 2014-07-07 ENCOUNTER — Encounter (HOSPITAL_COMMUNITY): Payer: Self-pay | Admitting: Emergency Medicine

## 2014-07-07 DIAGNOSIS — Z79899 Other long term (current) drug therapy: Secondary | ICD-10-CM | POA: Diagnosis not present

## 2014-07-07 DIAGNOSIS — I509 Heart failure, unspecified: Secondary | ICD-10-CM | POA: Diagnosis not present

## 2014-07-07 DIAGNOSIS — Z8781 Personal history of (healed) traumatic fracture: Secondary | ICD-10-CM | POA: Diagnosis not present

## 2014-07-07 DIAGNOSIS — I4891 Unspecified atrial fibrillation: Secondary | ICD-10-CM | POA: Diagnosis not present

## 2014-07-07 DIAGNOSIS — Z9889 Other specified postprocedural states: Secondary | ICD-10-CM | POA: Insufficient documentation

## 2014-07-07 DIAGNOSIS — R231 Pallor: Secondary | ICD-10-CM | POA: Insufficient documentation

## 2014-07-07 DIAGNOSIS — Z8719 Personal history of other diseases of the digestive system: Secondary | ICD-10-CM | POA: Diagnosis not present

## 2014-07-07 DIAGNOSIS — D509 Iron deficiency anemia, unspecified: Secondary | ICD-10-CM | POA: Diagnosis not present

## 2014-07-07 DIAGNOSIS — E538 Deficiency of other specified B group vitamins: Secondary | ICD-10-CM | POA: Insufficient documentation

## 2014-07-07 DIAGNOSIS — I469 Cardiac arrest, cause unspecified: Secondary | ICD-10-CM | POA: Diagnosis present

## 2014-07-07 DIAGNOSIS — Z86711 Personal history of pulmonary embolism: Secondary | ICD-10-CM | POA: Insufficient documentation

## 2014-07-07 DIAGNOSIS — Z7901 Long term (current) use of anticoagulants: Secondary | ICD-10-CM | POA: Diagnosis not present

## 2014-07-07 LAB — IRON AND TIBC
%SAT: 10 % — AB (ref 20–55)
IRON: 29 ug/dL — AB (ref 42–145)
TIBC: 280 ug/dL (ref 250–470)
UIBC: 251 ug/dL (ref 125–400)

## 2014-07-07 LAB — FOLATE: Folate: 14.3 ng/mL

## 2014-07-07 LAB — FERRITIN: Ferritin: 363 ng/mL — ABNORMAL HIGH (ref 10–291)

## 2014-07-08 NOTE — ED Provider Notes (Signed)
Patient seen by myself, with Bernarda Caffey PA upon arrival. Greater than 30 minutes of CPR without spontaneous pulses and non-perfusing rhythm. 80-year-old patient. Care was terminated upon her arrival with the above history. She is pulseless, apneic, and asystolic on the monitor. I met with the patient's children. She does have a DO NOT RESUSCITATE order. All of her questions were answered.  Tanna Furry, MD 07/08/14 1004

## 2014-07-13 ENCOUNTER — Ambulatory Visit: Payer: Medicare Other | Admitting: Pharmacist Clinician (PhC)/ Clinical Pharmacy Specialist

## 2014-07-15 ENCOUNTER — Ambulatory Visit: Payer: Medicare Other | Admitting: Cardiology

## 2014-07-18 NOTE — ED Provider Notes (Signed)
Medical screening examination/treatment/procedure(s) were conducted as a shared visit with non-physician practitioner(s) and myself.  I personally evaluated the patient during the encounter.   EKG Interpretation None      Pt seen with L.Sanders on arrival.  Pulseless apneic on arrival despite multiple rounds ACLS.  Unresponsive on exam, with Asystole on monitor.  Efforts terminated.   I agree with L.Sanders note.  Tanna Furry, MD 07/15/14 [phone]

## 2014-07-18 NOTE — ED Notes (Signed)
Family at bedside with patient.

## 2014-07-18 NOTE — ED Notes (Signed)
See code narrator 

## 2014-07-18 NOTE — Code Documentation (Signed)
Patient time of death occurred at 76.

## 2014-07-18 NOTE — Progress Notes (Signed)
Pt to ED from home via [facility] for witnessed arrest at home. Recently discharged from hospital for CHF. Home health nurse went for a follow up visit today. Patient deceased. Escorted Son and daughter to [facility] to visit at bedside.  Provided empathic listening, ministry of presence and hospitality.  Promoted information sharing between staff and family.  Remained with family until their departure.  2014/07/26 1400  Clinical Encounter Type  Visited With Family;Health care provider  Visit Type Spiritual support;Death;ED  Referral From Nurse  Spiritual Encounters  Spiritual Needs Emotional;Grief support  Stress Factors  Family Stress Factors Loss  Jaclynn Major, [place]

## 2014-07-18 NOTE — Code Documentation (Signed)
Pulse check; asystole on monitor; cpr continued

## 2014-07-18 NOTE — Code Documentation (Signed)
Pt to ED from home via [facility] for witnessed arrest at home. Recently discharged from hospital for CHF. Home health nurse went for a follow up visit today. Pt appeared pale per EMS and c/o epigastric pain and shortness of breath prior to arrest. CPR started x 3 mins by EMS, rhythm pulseless vtach; shocked at [place]; rhythm asystole; CPR started again; epi given, rhythm vfib; shocked at [place], rhythm PEA. Epi x7 and narcan x 1 given enroute.

## 2014-07-18 NOTE — ED Notes (Signed)
Transported to the morgue

## 2014-07-18 NOTE — ED Provider Notes (Signed)
CSN: [id number]     Arrival date & time 07-25-2014  1313 History   First MD Initiated Contact with Patient Jul 25, 2014 1332     Chief Complaint  Patient presents with  . Cardiac Arrest     (Consider location/radiation/quality/duration/timing/severity/associated sxs/prior Treatment) HPI  LEVEL 5 CAVEAT:  ACUITY OF CONDITION 80 y.o. F arriving from home with CPR in progress.  Pt was recently discharged from hospital for CHF exacerbation.  Home health was at the home evaluating patient and witnessed arrest.  States patient has been complaining of some mild epigastric pain and SOB prior to arrest.  Aide began CPR for approximately 2 minutes but stopped when patient began breathing again.  They did not confirm return of pulse.  On EMS arrival pt had been without any CPR for 3-5 mins.  Initially pt was found to be in pulseless v-tach, shocked at 200J, asystole, CPR resumed, epi given with a rhythm of v. Fib, shocked again with PEA rhythm.  She was given 7 rounds of epi and 2 of narcan en route without return of pulse.  Pt arrives on [facility].  Past Medical History  Diagnosis Date  . Abnormal liver function test     with amiodarone  . IBS (irritable bowel syndrome)   . Pancreatic insufficiency     ? mass on imaging notborne out on follow-up  . Gastric ulcer   . Erosive gastritis 7/10  . Vitamin B12 deficiency   . Iron deficiency anemia   . Compression fracture of C-spine   . Pulmonary embolism 2013    bilateral   . Diverticulosis   . Internal hemorrhoids   . Anemia     post hemorragic  . GI bleed   . Fibromyalgia   . Atrial fibrillation, chronic   . Osteoarthritis     back and lower extremities, neck  . LBBB (left bundle branch block)   . Nonischemic cardiomyopathy     Hx of-1998 EF 40%, improved to 50-55%  . Anticoagulated on Coumadin     for atrial fib  . Echocardiogram abnormal 02/13/2012    EF 50-55%, LA severely dilated, mild MR, mild TR, rvSYST. PRESSURE 30-40  . H/O cardiovascular  stress test 03/30/2011    no ischemia, no change from previuos scan  . SSS (sick sinus syndrome) 2012    mild, lowest HR wth biowatch, 43.  fastest 110  . CHF (congestive heart failure) 06/25/2014   Past Surgical History  Procedure Laterality Date  . Tubal ligation    . Back surgery    . Left knee surgery    . Hernia repair    . Carpal tunnel release      right  . Wrist surgery      right plate  . Bladder surgery      tact  . Ovarian cyst removal    . Esophagogastroduodenoscopy  06/2009    erosive gastritis, small ulcer, hiatal hernia (negative biopsies)  . Colonoscopy  08/2009    diverticulosis, internal hemorrhoids  . Upper gastrointestinal endoscopy  05/30/11    normal  . Colonoscopy  06/02/11    Diverticulosis  . Capsule endoscopy  06/04/11    normal  . Esophagogastroduodenoscopy  11/29/2011    Procedure: ESOPHAGOGASTRODUODENOSCOPY (EGD);  Surgeon: Inda Castle, MD;  Location: [facility];  Service: Endoscopy;  Laterality: N/A;  . Givens capsule study  11/29/2011    Procedure: GIVENS CAPSULE STUDY;  Surgeon: Inda Castle, MD;  Location: [facility];  Service: Endoscopy;  Laterality: N/A;  . Enteroscopy  12/02/2011    Procedure: ENTEROSCOPY;  Surgeon: Inda Castle, MD;  Location: [facility];  Service: Endoscopy;  Laterality: N/A;  . Colonoscopy  12/03/2011    Procedure: COLONOSCOPY;  Surgeon: Inda Castle, MD;  Location: [facility];  Service: Endoscopy;  Laterality: N/A;  . Colonoscopy  12/04/2011    Procedure: COLONOSCOPY;  Surgeon: Owens Loffler, MD;  Location: [facility];  Service: Endoscopy;  Laterality: N/A;  . Colonoscopy  11/06/2012    Procedure: COLONOSCOPY;  Surgeon: Milus Banister, MD;  Location: [facility];  Service: Endoscopy;  Laterality: N/A;  . Colonoscopy N/A 02/12/2013    Procedure: COLONOSCOPY;  Surgeon: Milus Banister, MD;  Location: [facility];  Service: Endoscopy;  Laterality: N/A;  . Hot hemostasis N/A 02/12/2013    Procedure: HOT  HEMOSTASIS (ARGON PLASMA COAGULATION/BICAP);  Surgeon: Milus Banister, MD;  Location: [facility];  Service: Endoscopy;  Laterality: N/A;  . Cardiac catheterization  1994    normal coronary arteries, EF 40%   Family History  Problem Relation Age of Onset  . Heart disease Mother   . Stroke Mother   . Anesthesia problems Neg Hx   . Malignant hyperthermia Neg Hx   . Colon cancer Neg Hx   . Stroke Father   . Cancer Brother   . Cancer Daughter    History  Substance Use Topics  . Smoking status: Never Smoker   . Smokeless tobacco: Never Used  . Alcohol Use: Yes     Comment: glass of wine occasionaly   OB History   Grav Para Term Preterm Abortions TAB SAB Ect Mult Living                 Review of Systems  Unable to perform ROS     Allergies  Amiodarone; Infed; Meperidine hcl; and Morphine sulfate  Home Medications   Prior to Admission medications   Medication Sig Start Date End Date Taking? Authorizing Provider  cholecalciferol (VITAMIN D) 1000 UNITS tablet Take 1,000 Units by mouth daily.    Historical Provider, MD  cyanocobalamin (,VITAMIN B-12,) 1000 MCG/ML injection Inject 1,000 mcg into the muscle every 3 (three) months.    Historical Provider, MD  cycloSPORINE (RESTASIS) 0.05 % ophthalmic emulsion Place 1 drop into both eyes 2 (two) times daily.     Historical Provider, MD  digoxin (LANOXIN) 0.125 MG tablet Take by mouth daily.    Historical Provider, MD  ferrous sulfate 325 (65 FE) MG tablet Take 1 tablet (325 mg total) by mouth 2 (two) times daily with a meal. 06/29/14   Charlynne Cousins, MD  furosemide (LASIX) 20 MG tablet Take 1 tablet (20 mg total) by mouth daily. May take extra 1 tablet (20mg ) as needed for swelling. 06/10/14   Troy Sine, MD  Lutein 20 MG TABS Take 1 tablet by mouth daily.     Historical Provider, MD  nebivolol (BYSTOLIC) 2.5 MG tablet Take 1 tablet (2.5 mg total) by mouth daily. 10/08/13   Troy Sine, MD  nebivolol (BYSTOLIC) 5 MG  tablet Take 0.5 tablets (2.5 mg total) by mouth daily. 07/06/14   Tommy Medal, RPH-CPP  ondansetron (ZOFRAN ODT) 4 MG disintegrating tablet Take 1 tablet (4 mg total) by mouth every 8 (eight) hours as needed for nausea or vomiting. 06/29/14   Charlynne Cousins, MD  pantoprazole (PROTONIX) 40 MG tablet Take 1 tablet (40 mg total) by mouth 2 (two) times daily before a  meal. 06/29/14   Charlynne Cousins, MD  polyethylene glycol [facility] / Floria Raveling) packet Take 17 g by mouth daily. 06/29/14   Charlynne Cousins, MD  warfarin (COUMADIN) 2 MG tablet Take 1-2 mg by mouth See admin instructions. Take 1 mg on Monday and Friday.  Take 2 mg on all other days. 01/20/14   Kristin Alvstad, RPH-CPP   BP 0/0  Pulse 0  Resp 0  SpO2 0%  Physical Exam  Constitutional: She is intubated.  Elderly, pale  HENT:  Head: Normocephalic and atraumatic.  Eyes:  Pupils fixed, not reactive  Cardiovascular:  No audible heart tones, no palpable pulse  Pulmonary/Chest: Breath sounds normal. She is intubated.  Intubated, Normal rise and fall of chest bilaterally  Abdominal: Soft. Normal appearance.  Neurological: She is unresponsive.  Skin: There is pallor.  Cool to touch    ED Course  Procedures (including critical care time) Labs Review Labs Reviewed - No data to display  Imaging Review No results found.   EKG Interpretation None      MDM   Final diagnoses:  None   80 y.o. F arriving with CPR in progress after witnessed arrest by home health aide.  She was originally found to be in pulseless v-tach on EMS arrival, arrives to ED in PEA.  She was given 7 rounds of epinephrine and 2 of narcan en route without ROSC.  Arrives to ED on [facility].  On pulse check, pt asystole on 12 lead and EMS monitor.  TOD called at 1318 by Dr. Jeneen Rinks.  I personally contacted patient's primary care physician, Dr. Everette Rank, who will sign death certificate.  Larene Pickett, PA-C 2014/07/15 1635

## 2014-07-18 DEATH — deceased

## 2014-08-06 ENCOUNTER — Encounter: Payer: Self-pay | Admitting: *Deleted

## 2014-08-23 ENCOUNTER — Other Ambulatory Visit: Payer: Self-pay | Admitting: Cardiovascular Disease
